# Patient Record
Sex: Female | Born: 1965 | Race: White | Hispanic: No | Marital: Married | Smoking: Never smoker
Health system: Southern US, Community
[De-identification: ages and names within clinical notes are randomized; demographics above are authoritative.]

## PROBLEM LIST (undated history)

## (undated) DIAGNOSIS — G629 Polyneuropathy, unspecified: Secondary | ICD-10-CM

## (undated) DIAGNOSIS — C801 Malignant (primary) neoplasm, unspecified: Secondary | ICD-10-CM

## (undated) DIAGNOSIS — K219 Gastro-esophageal reflux disease without esophagitis: Secondary | ICD-10-CM

## (undated) DIAGNOSIS — F32A Depression, unspecified: Secondary | ICD-10-CM

## (undated) DIAGNOSIS — E063 Autoimmune thyroiditis: Secondary | ICD-10-CM

## (undated) DIAGNOSIS — Z5189 Encounter for other specified aftercare: Secondary | ICD-10-CM

## (undated) DIAGNOSIS — H269 Unspecified cataract: Secondary | ICD-10-CM

## (undated) DIAGNOSIS — M1990 Unspecified osteoarthritis, unspecified site: Secondary | ICD-10-CM

## (undated) DIAGNOSIS — S22080A Wedge compression fracture of T11-T12 vertebra, initial encounter for closed fracture: Secondary | ICD-10-CM

## (undated) DIAGNOSIS — F329 Major depressive disorder, single episode, unspecified: Secondary | ICD-10-CM

## (undated) DIAGNOSIS — M797 Fibromyalgia: Secondary | ICD-10-CM

## (undated) DIAGNOSIS — F909 Attention-deficit hyperactivity disorder, unspecified type: Secondary | ICD-10-CM

## (undated) DIAGNOSIS — Z889 Allergy status to unspecified drugs, medicaments and biological substances status: Secondary | ICD-10-CM

## (undated) DIAGNOSIS — C9000 Multiple myeloma not having achieved remission: Secondary | ICD-10-CM

## (undated) DIAGNOSIS — M810 Age-related osteoporosis without current pathological fracture: Secondary | ICD-10-CM

## (undated) DIAGNOSIS — E039 Hypothyroidism, unspecified: Secondary | ICD-10-CM

## (undated) DIAGNOSIS — D801 Nonfamilial hypogammaglobulinemia: Secondary | ICD-10-CM

## (undated) DIAGNOSIS — F419 Anxiety disorder, unspecified: Secondary | ICD-10-CM

## (undated) DIAGNOSIS — T7840XA Allergy, unspecified, initial encounter: Secondary | ICD-10-CM

## (undated) DIAGNOSIS — N183 Chronic kidney disease, stage 3 unspecified: Secondary | ICD-10-CM

## (undated) HISTORY — PX: ADENOIDECTOMY: SUR15

## (undated) HISTORY — DX: Fibromyalgia: M79.7

## (undated) HISTORY — DX: Hypothyroidism, unspecified: E03.9

## (undated) HISTORY — DX: Chronic kidney disease, stage 3 unspecified: N18.30

## (undated) HISTORY — PX: WRIST SURGERY: SHX841

## (undated) HISTORY — DX: Wedge compression fracture of T11-T12 vertebra, initial encounter for closed fracture: S22.080A

## (undated) HISTORY — DX: Nonfamilial hypogammaglobulinemia: D80.1

## (undated) HISTORY — DX: Encounter for other specified aftercare: Z51.89

## (undated) HISTORY — DX: Gastro-esophageal reflux disease without esophagitis: K21.9

## (undated) HISTORY — DX: Major depressive disorder, single episode, unspecified: F32.9

## (undated) HISTORY — DX: Depression, unspecified: F32.A

## (undated) HISTORY — DX: Unspecified cataract: H26.9

## (undated) HISTORY — PX: BONE MARROW BIOPSY: SHX199

## (undated) HISTORY — DX: Anxiety disorder, unspecified: F41.9

## (undated) HISTORY — PX: PARTIAL HYSTERECTOMY: SHX80

## (undated) HISTORY — DX: Age-related osteoporosis without current pathological fracture: M81.0

## (undated) HISTORY — DX: Unspecified osteoarthritis, unspecified site: M19.90

## (undated) HISTORY — PX: TUBAL LIGATION: SHX77

## (undated) HISTORY — DX: Polyneuropathy, unspecified: G62.9

## (undated) HISTORY — PX: TONSILLECTOMY: SUR1361

## (undated) HISTORY — PX: PORTA CATH INSERTION: CATH118285

## (undated) HISTORY — PX: KNEE SURGERY: SHX244

## (undated) HISTORY — DX: Allergy status to unspecified drugs, medicaments and biological substances: Z88.9

## (undated) HISTORY — DX: Autoimmune thyroiditis: E06.3

## (undated) HISTORY — DX: Attention-deficit hyperactivity disorder, unspecified type: F90.9

## (undated) HISTORY — DX: Allergy, unspecified, initial encounter: T78.40XA

## (undated) HISTORY — PX: SINOSCOPY: SHX187

## (undated) HISTORY — PX: CHEST TUBE INSERTION: SHX231

## (undated) MED FILL — Bortezomib For Inj 3.5 MG: INTRAMUSCULAR | Qty: 1 | Status: AC

## (undated) MED FILL — Dexamethasone Sodium Phosphate Inj 100 MG/10ML: INTRAMUSCULAR | Qty: 4 | Status: AC

## (undated) MED FILL — Daratumumab-Hyaluronidase-fihj Inj 1800-30000 MG-Unit/15ML: SUBCUTANEOUS | Qty: 15 | Status: AC

## (undated) MED FILL — Bortezomib For Inj 3.5 MG: INTRAMUSCULAR | Qty: 1.2 | Status: AC

## (undated) MED FILL — Cyclophosphamide For Inj 1 GM: INTRAMUSCULAR | Qty: 29 | Status: AC

---

## 2001-09-11 ENCOUNTER — Ambulatory Visit (HOSPITAL_BASED_OUTPATIENT_CLINIC_OR_DEPARTMENT_OTHER): Admission: RE | Admit: 2001-09-11 | Discharge: 2001-09-11 | Payer: Self-pay | Admitting: Orthopedic Surgery

## 2006-04-17 ENCOUNTER — Encounter: Admission: RE | Admit: 2006-04-17 | Discharge: 2006-04-17 | Payer: Self-pay | Admitting: Obstetrics and Gynecology

## 2008-04-14 ENCOUNTER — Encounter: Admission: RE | Admit: 2008-04-14 | Discharge: 2008-04-14 | Payer: Self-pay | Admitting: Obstetrics and Gynecology

## 2008-04-27 ENCOUNTER — Encounter: Admission: RE | Admit: 2008-04-27 | Discharge: 2008-04-27 | Payer: Self-pay | Admitting: Emergency Medicine

## 2008-09-12 ENCOUNTER — Encounter: Admission: RE | Admit: 2008-09-12 | Discharge: 2008-09-12 | Payer: Self-pay | Admitting: Family Medicine

## 2008-09-28 ENCOUNTER — Ambulatory Visit (HOSPITAL_BASED_OUTPATIENT_CLINIC_OR_DEPARTMENT_OTHER): Admission: RE | Admit: 2008-09-28 | Discharge: 2008-09-28 | Payer: Self-pay | Admitting: Specialist

## 2008-10-12 ENCOUNTER — Ambulatory Visit (HOSPITAL_BASED_OUTPATIENT_CLINIC_OR_DEPARTMENT_OTHER): Admission: RE | Admit: 2008-10-12 | Discharge: 2008-10-12 | Payer: Self-pay | Admitting: Specialist

## 2009-12-29 ENCOUNTER — Encounter: Admission: RE | Admit: 2009-12-29 | Discharge: 2009-12-29 | Payer: Self-pay | Admitting: Obstetrics and Gynecology

## 2010-01-05 ENCOUNTER — Encounter: Admission: RE | Admit: 2010-01-05 | Discharge: 2010-01-05 | Payer: Self-pay | Admitting: Obstetrics and Gynecology

## 2010-09-25 ENCOUNTER — Encounter: Payer: Self-pay | Admitting: Obstetrics and Gynecology

## 2010-09-25 ENCOUNTER — Encounter: Payer: Self-pay | Admitting: Endocrinology

## 2010-12-19 LAB — POCT I-STAT 4, (NA,K, GLUC, HGB,HCT)
Hemoglobin: 15.3 g/dL — ABNORMAL HIGH (ref 12.0–15.0)
Potassium: 3.9 mEq/L (ref 3.5–5.1)
Sodium: 141 mEq/L (ref 135–145)

## 2011-01-17 NOTE — Op Note (Signed)
NAMEFUSAE, Kimberly Esparza               ACCOUNT NO.:  [id number]   MEDICAL RECORD NO.:  [id number]          PATIENT TYPE:  AMB   LOCATION:  [facility]                         FACILITY:  [facility]   PHYSICIAN:  Jene Every, M.D.    DATE OF BIRTH:  February 25, 1966   DATE OF PROCEDURE:  10/12/2008  DATE OF DISCHARGE:                               OPERATIVE REPORT   PREOPERATIVE DIAGNOSES:  Medial meniscus tear, chondromalacia of the  medial compartment.   POSTOPERATIVE DIAGNOSES:  Medial meniscus tear, chondromalacia of the  medial compartment, chondromalacia of patellofemoral joint.   PROCEDURE PERFORMED:  Left knee arthroscopy, shaving of medial meniscus,  chondroplasty of the patella, medial femoral condyle, tibial plateau.   BRIEF HISTORY AND INDICATION:  This is a 45 year old female status post  knee arthroscopy, debridement on the right with a medial meniscus tear.  She has similar symptoms with mechanical symptoms of left knee  refractory to conservative treatment.  She was indicated for diagnostic  arthroscopy and arthroscopy risks and benefits were discussed including  bleeding, infection, no change in symptoms, worsening of symptoms, need  for repeat debridement, anesthetic complications, etc.   TECHNIQUE:  With the patient in supine position after adequate  anesthesia and 2 g Kefzol, the left lower extremity was prepped and  draped in the sterile fashion.  A lateral parapatellar portal and  superomedial parapatellar portal was fashioned with a #11 blade and the  __________ atraumatically placed.  Irrigant was utilized to insufflate  the joint.  Under  direct visualization medial parapatellar portal was  fashioned with a #11 blade after localization with 18 gauge needle  sparing the medial meniscus.  Noted were some chondral grade 3 changes  of the medial compartment, tibial plateau, loose cartilaginous debris  and mild radial tearing of the medial meniscus.  Collene Mares was introduced  and  utilized to perform a light chondroplasty of tibial plateau and  shaving the meniscus and of the femoral condyle.  The remnant of  meniscus was stable to probe palpation without evidence of further  pathology.  ACL and PCL were unremarkable.  Lateral compartment revealed  normal femoral condyle, tibial plateau and meniscus stable to probe  palpation without evidence of tear.  Patella revealed some grade 3  changes and there was normal patellofemoral tracking.  Light  chondroplasty was performed here.  The gutters were unremarkable.  The  knee was copiously lavaged.  Re-examination of compartment, no further  pathology amenable to arthroscopic intervention.  We checked the entire  surface of the femoral condyles bilaterally and at the tibia a large  focal defect noted.   Next all instrumentation was removed.  Portals were closed with 4-0  nylon simple sutures.  0.25% Marcaine with epinephrine was infiltrated  in the joint and the wound was dressed sterilely.  Awoke without  difficulty and transported to recovery in satisfactory condition.   The patient tolerated the procedure well with no complications.      Jene Every, M.D.  Electronically Signed     JB/MEDQ  D:  10/12/2008  T:  10/13/2008  Job:  [id number]

## 2011-01-17 NOTE — Op Note (Signed)
NAMEALMAROSA, BOHAC               ACCOUNT NO.:  [id number]   MEDICAL RECORD NO.:  [id number]          PATIENT TYPE:  AMB   LOCATION:  [facility]                         [facility]ACILITY:  [facility]   PHYSICIAN:  Jene Every, M.D.    DATE OF BIRTH:  1966-06-18   DATE OF PROCEDURE:  09/28/2008  DATE OF DISCHARGE:                               OPERATIVE REPORT   PREOPERATIVE DIAGNOSIS:  Medial meniscus tear right knee.   POSTOPERATIVE DIAGNOSIS:  Medial meniscus tear right knee, grade 3  chondromalacia of the medial femoral condyle.   PROCEDURE PERFORMED:  1. Right knee arthroscopy.  2. Partial medial meniscectomy.  3. Chondroplasty medial femoral condyle.   BRIEF HISTORY:  This is a 45 year old, MRI indicating medial meniscus  tear.  She had mechanical symptoms, indicated for partial medial  meniscectomy.  Risks and benefits discussed including bleeding,  infection, damage to neurovascular structures, suboptimal range of  motion, persistent pain, need for repeat debridement, anesthetic  complications, DVT, PE, etc.   TECHNIQUE:  With the patient in the supine position, after induction of  adequate general anesthesia and 1 gram of vancomycin the right lower  extremity was prepped and draped in the usual sterile fashion.  A  lateral parapatellar portal and superomedial parapatellar portal was  fashioned with a #11 blade.  Ingress cannula atraumatically placed,  irrigant was utilized to insufflate the joint.  Under direct  visualization a medial parapatellar portal was fashioned with a #11  blade after localization with an 18-gauge needle, sparing the medial  meniscus.  Noted was a radial and complex tear of the posterior third of  the medial meniscus, unstable to probe palpation, a straight basket  rongeur was introduced, utilized to perform partial medial meniscectomy  to a stable base, further contoured with a 3.5 Cuda shaver.  Remnant was  now stable to probe palpation, about 50% of the  posterior third was  resected.  Grade 3 changes of the femoral condyle was noted anteriorly  and a light chondroplasty was performed here.  Minor changes of the  tibial plateau of grade 3 were noted as well.  A light shaving was  performed here.   The intercondylar notch was normal on ACL and PCL.   Lateral compartment revealed normal femoral condyle, tibial plateau and  meniscus stable to probe palpation without evidence of tearing.   Suprapatellar pouch revealed normal patellofemoral tracking, no  significant chondromalacia.  No loose bodies or synovitis.   The gutters, medial and lateral, were unremarkable without loose bodies.   Re-valuation of the medial compartment revealed no further pathology  amenable to arthroscopic intervention.   All instrumentation was removed.  Portals were closed with 4-0 nylon  simple sutures, 0.25% Marcaine with epinephrine was infiltrated in the  joint, wound was dressed sterilely, she was awoken without difficulty  and transported to the recovery room in satisfactory condition.   The patient tolerated the procedure well, there were no complications.  No assistant.      Jene Every, M.D.  Electronically Signed     JB/MEDQ  D:  09/28/2008  T:  09/28/2008  Job:  [id number]

## 2011-01-20 NOTE — Op Note (Signed)
[place]  Patient:    JAYMES, REVELS Visit Number: [id number] MRN: [id number]          Service Type: Attending:  Artist Pais. Mina Marble, M.D. Dictated by:   Artist Pais Mina Marble, M.D. Proc. Date: 09/11/01                             Operative Report  PREOPERATIVE DIAGNOSIS:  Right index and right first dorsal compartment stenosing tenosynovitis and also right forearm lesion.  POSTOPERATIVE DIAGNOSIS:  Right index and right first dorsal compartment stenosing tenosynovitis and also right forearm lesion.  PROCEDURE: 1. Release A1 pulley, right index finger. 2. Release first dorsal compartment. 3. Excisional biopsy lesion volar forearm, right arm.  SURGEON:  Artist Pais. Mina Marble, M.D.  ASSISTANT:  Junius Roads. Ireton, P.A.C.  ANESTHESIA:  Bier block.  TOURNIQUET TIME:  30 minutes.  COMPLICATIONS:  None.  DRAINS:  None.  SPECIMENS:  One specimen sent.  DESCRIPTION:  Patient was taken to the operating room where after the induction of adequate Bier block analgesia the right upper extremity was prepped and draped in usual sterile fashion.  Once this was done a 1.5 cm incision was made in the palmar aspect of the index finger area over the A1 pulley.  The incision was taken down through the skin and subcutaneous tissues until the palmar fascia was identified.  This was split, thus exposing the flexor system.  The radial and ulnar vascular bundles were identified and retracted, and the A1 pulley was released with a 15 blade, thus freeing up the superficialis profundus tendon.  These were lysed of all adhesions.  The wound was irrigated and closed with 5-0 nylon sutures in a combination of horizontal mattress and simple sutures.  Next, the dorsal compartment release was undertaken 1 cm proximal to the radial styloid, 2.5 cm in length.  Incision was taken down through the skin and subcutaneous tissues until the cephalic vein and the superficial  radial nerve were identified and retracted dorsally.  The first dorsal compartment was identified and incised at the most dorsal component.  There were three separate slips - two for the APL and one for the EPB - and these were all lysed of all adhesions.  This wound was also irrigated and closed with a running 3-0 Prolene subcuticular stitch.  Finally, a 7 mm x 7 mm circular lesion over the volar forearm in the mid forearm area was elliptically excised for pathologic confirmation/diagnosis. This was done with a 15 blade and then closed with a running 3-0 Prolene subcuticular stitch.  Steri-Strips, 4x4s, fluffs, and a compressive dressing was applied to all the wounds after Marcaine was injected.  Patient tolerated the procedure well and went to recovery room in stable fashion. Dictated by:   Artist Pais Mina Marble, M.D. Attending:  Artist Pais Mina Marble, M.D. DD:  09/11/01 TD:  09/11/01 Job: [id number]

## 2013-07-25 DIAGNOSIS — R4701 Aphasia: Secondary | ICD-10-CM | POA: Insufficient documentation

## 2013-07-25 DIAGNOSIS — G253 Myoclonus: Secondary | ICD-10-CM | POA: Insufficient documentation

## 2013-07-25 DIAGNOSIS — R5381 Other malaise: Secondary | ICD-10-CM | POA: Insufficient documentation

## 2013-07-25 DIAGNOSIS — R6889 Other general symptoms and signs: Secondary | ICD-10-CM | POA: Insufficient documentation

## 2016-09-12 ENCOUNTER — Ambulatory Visit (INDEPENDENT_AMBULATORY_CARE_PROVIDER_SITE_OTHER): Payer: Commercial Managed Care - PPO | Admitting: Allergy

## 2016-09-12 ENCOUNTER — Encounter: Payer: Self-pay | Admitting: Allergy

## 2016-09-12 VITALS — BP 120/84 | HR 80 | Temp 98.0°F | Resp 18 | Ht 65.4 in | Wt 199.4 lb

## 2016-09-12 DIAGNOSIS — H1010 Acute atopic conjunctivitis, unspecified eye: Secondary | ICD-10-CM | POA: Diagnosis not present

## 2016-09-12 DIAGNOSIS — L299 Pruritus, unspecified: Secondary | ICD-10-CM

## 2016-09-12 DIAGNOSIS — R21 Rash and other nonspecific skin eruption: Secondary | ICD-10-CM

## 2016-09-12 DIAGNOSIS — J309 Allergic rhinitis, unspecified: Secondary | ICD-10-CM

## 2016-09-12 MED ORDER — MOMETASONE FUROATE 0.1 % EX OINT: TOPICAL_OINTMENT | Freq: Two times a day (BID) | CUTANEOUS | 3 refills | Status: DC

## 2016-09-12 MED ORDER — AZELASTINE HCL 0.1 % NA SOLN: 2.0000 | Freq: Two times a day (BID) | NASAL | 3 refills | Status: DC | PRN

## 2016-09-12 MED ORDER — LEVOCETIRIZINE DIHYDROCHLORIDE 5 MG PO TABS: 5.0000 mg | ORAL_TABLET | Freq: Every day | ORAL | 3 refills | Status: DC

## 2016-09-12 NOTE — Patient Instructions (Addendum)
Itching/Rash    - likely allergic trigger with known constant exposures to dog and mold.  Will obtain environmental allergen profile to see if you have developed any new allergies.     - may also have a contact dermatitis component given appearance of rash and distribution.   Will assess via TRUE test patch testing.  Placed today.  Do not get patches wet.  Return on Thursday for reading.     - recommend avoiding dog contact exposure as much as possible.  Rinse skin that has come in contact as soon as possible    - Take Xyzal 5mg  twice a day    - Take Zantac 300mg  daily.  Once you have completed this bottle recommend Zantac 150mg  twice a day with Xyzal    - Continue Singulair daily    - use Elocon ointment on affects areas of rash twice a day until resolved.   Continue daily moisturization  Allergies   - antihistamine as above   - trial Astelin nasal spray for control of nasal drainage and post nasal drip   Follow-up 2 days for patch reading then routine visit in 3 months

## 2016-09-12 NOTE — Progress Notes (Signed)
New Patient Note  RE: Kimberly Esparza MRN: [id number] DOB: 12-26-1965 Date of Office Visit: 09/12/2016  Referring provider: No ref. provider found Primary care provider: Myrlene Broker, MD  Chief Complaint: itching, rash  History of present illness: Kimberly Esparza is a 51 y.o. female presenting today for evaluation of pruritus and rash.   She is a previous pt of this practice with last visit in March 2014.  She has history of allergic rhinoconjunctivitis as well as pruritus.  She knows her dog exposure may worsen her itchiness however she feels her pruritus may be due to other triggers that she would like to identify.  She returns today as she has been having worsening of her pruritus as well as rash.   She scratches to the point that she breaks the skin.  She is itchier since stopping her antihistamine for this appointment today.  She has been using triamcinolone for the itching which does not seem to help.  She is also constantly using scent/color free soaps, detergents.  The itching and rash is predominantly on her arms and thighs.  She is not sure if there is something she is putting on or where he was exposed to other then her dog that could be causing her continued symptoms. She takes Xyzal and Singulair daily.   No fevers, no household members with similar rash. No recent travel.  She does not have any associated swelling, joint aches or pains.  She has history of allergic rhinoconjunctivitis.  She is allergic to mold and her job she knows has mold.   She complains of nasal drainage and PND.   She was on allergen immunotherapy and reached about the 3/5 vial before stopping as it became too inconvenient for her.   Review of systems: Review of Systems  Constitutional: Negative for chills, fever and malaise/fatigue.  HENT: Positive for congestion. Negative for ear pain, nosebleeds, sinus pain and sore throat.   Eyes: Negative for discharge and redness.  Respiratory: Negative for  cough, shortness of breath and wheezing.   Cardiovascular: Negative for chest pain.  Gastrointestinal: Negative for abdominal pain, heartburn, nausea and vomiting.  Musculoskeletal: Negative for joint pain and myalgias.  Skin: Positive for itching and rash.    All other systems negative unless noted above in HPI  Past medical history: Past Medical History:  Diagnosis Date  . Depression   . GERD (gastroesophageal reflux disease)   . Hypothyroidism   . Multiple allergies   . Osteoarthritis     Past surgical history: Past Surgical History:  Procedure Laterality Date  . ADENOIDECTOMY    . KNEE SURGERY Bilateral   . PARTIAL HYSTERECTOMY    . SINOSCOPY    . TONSILLECTOMY    . TUBAL LIGATION    . WRIST SURGERY Bilateral     Family history:  Family History  Problem Relation Age of Onset  . Hypertension Mother   . Diabetes Mother   . Glaucoma Mother   . Gout Mother   . Allergic rhinitis Father   . Allergic rhinitis Brother   . Gout Brother     Social history: She lives in a home without carpeting. There is no concern for water damage or mildew. There is electric heating and central cooling. She does use a humidifier. There are dogs outside the home. There is no concern for roaches. She works as an Electronics engineer.   Medication List: Allergies as of 09/12/2016   No Known Allergies  Medication List       Accurate as of 09/12/16  5:47 PM. Always use your most recent med list.          azelastine 0.1 % nasal spray Commonly known as:  ASTELIN Place 2 sprays into both nostrils 2 (two) times daily as needed for rhinitis. Use in each nostril as directed   buPROPion 300 MG 24 hr tablet Commonly known as:  WELLBUTRIN XL Take 300 mg by mouth daily.   DEXILANT 60 MG capsule Generic drug:  dexlansoprazole Take 1 capsule by mouth daily.   DULoxetine 60 MG capsule Commonly known as:  CYMBALTA Take 60 mg by mouth daily.   ESTROVEN PO Take by mouth.   etodolac  500 MG tablet Commonly known as:  LODINE Take 500 mg by mouth daily.   HAIR/SKIN/NAILS PO Take by mouth.   HYDROcodone-acetaminophen 5-325 MG tablet Commonly known as:  NORCO/VICODIN Take 1 tablet by mouth. for pain   lansoprazole 30 MG capsule Commonly known as:  PREVACID Take 30 mg by mouth daily as needed.   levocetirizine 5 MG tablet Commonly known as:  XYZAL Take 1 tablet (5 mg total) by mouth daily.   levothyroxine 75 MCG tablet Commonly known as:  SYNTHROID, LEVOTHROID Take 75 mcg by mouth daily.   mometasone 0.1 % ointment Commonly known as:  ELOCON Apply topically 2 (two) times daily.   montelukast 10 MG tablet Commonly known as:  SINGULAIR Take 10 mg by mouth daily.   ranitidine 300 MG tablet Commonly known as:  ZANTAC Take 300 mg by mouth daily.   traMADol 50 MG tablet Commonly known as:  ULTRAM Take 50 mg by mouth every 6 (six) hours as needed.   triamcinolone cream 0.1 % Commonly known as:  KENALOG apply TO affected AREA TWICE DAILY FOR ONE TO TWO WEEKS   vitamin C 1000 MG tablet Take 1,000 mg by mouth daily.   Vitamin D 2000 units Caps Take by mouth.   Zinc 50 MG Caps Take by mouth.       Known medication allergies: No Known Allergies   Physical examination: Blood pressure 120/84, pulse 80, temperature 98 F (36.7 C), temperature source Oral, resp. rate 18, height 5' 5.4" (1.661 m), weight 199 lb 6.4 oz (90.4 kg).  General: Alert, interactive, in no acute distress. HEENT: TMs pearly gray, turbinates mildly edematous without discharge, post-pharynx non erythematous. Neck: Supple without lymphadenopathy. Lungs: Clear to auscultation without wheezing, rhonchi or rales. {no increased work of breathing. CV: Normal S1, S2 without murmurs. Abdomen: Nondistended, nontender. Skin: Bilateral arms with erythematous his linear marks from sporadic she has lost excoriated patches. Extremities:  No clubbing, cyanosis or edema. Neuro:   Grossly  intact.  Diagnositics/Labs: TRUE test patch placement applied to the back, marked and taped.  Assessment and plan:   Pruritus/excoriated rash    - likely allergic trigger with known constant exposures to dog and mold.  Will obtain environmental allergen profile to see if she has developed any new allergies.     - may also have a contact dermatitis component given appearance of rash and distribution. She is also concerned that there is something she is coming into contact with on a regular basis worsening her symptoms.   Will assess via TRUE test patch testing.  Placed today.  Do not get patches wet.  Return on Thursday for reading.     - recommend avoiding dog contact exposure as much as possible.  Rinse skin that has  come in contact with known allergen as soon as possible    - Take Xyzal 5mg  twice a day    - Take Zantac 300mg  daily.  Once you have completed this bottle recommend Zantac 150mg  twice a day with Xyzal    - Continue Singulair daily    - use Elocon ointment on affects areas of rash twice a day until resolved.   Continue daily moisturization  Allergies   - antihistamine as above   - trial Astelin nasal spray for control of nasal drainage and post nasal drip   Follow-up 2 days for patch reading then routine visit in 3 months  I appreciate the opportunity to take part in Nathali's care. Please do not hesitate to contact me with questions.  Sincerely,   Prudy Feeler, MD Allergy/Immunology [facility] [place] [facility] [place]

## 2016-09-16 LAB — ALLERGENS W/TOTAL IGE AREA 2
Cat Dander IgE: <0.1 kU/L
Common Silver Birch IgE: <0.1 kU/L
D Farinae IgE: <0.1 kU/L
D Pteronyssinus IgE: <0.1 kU/L
Elm, American IgE: <0.1 kU/L
IGE (IMMUNOGLOBULIN E), SERUM: 8 [IU]/mL (ref 0–100)
Maple/Box Elder IgE: <0.1 kU/L
Mouse Urine IgE: <0.1 kU/L
Oak, White IgE: <0.1 kU/L
Pecan, Hickory IgE: <0.1 kU/L
Penicillium Chrysogen IgE: <0.1 kU/L
Sheep Sorrel IgE Qn: <0.1 kU/L

## 2016-09-19 ENCOUNTER — Encounter: Payer: Self-pay | Admitting: Allergy

## 2016-09-19 ENCOUNTER — Ambulatory Visit (INDEPENDENT_AMBULATORY_CARE_PROVIDER_SITE_OTHER): Payer: Commercial Managed Care - PPO | Admitting: Allergy

## 2016-09-19 VITALS — BP 118/76 | HR 90 | Resp 14

## 2016-09-19 DIAGNOSIS — L253 Unspecified contact dermatitis due to other chemical products: Secondary | ICD-10-CM | POA: Diagnosis not present

## 2016-09-19 DIAGNOSIS — H1010 Acute atopic conjunctivitis, unspecified eye: Secondary | ICD-10-CM | POA: Diagnosis not present

## 2016-09-19 DIAGNOSIS — J309 Allergic rhinitis, unspecified: Secondary | ICD-10-CM | POA: Diagnosis not present

## 2016-09-19 MED ORDER — LEVOCETIRIZINE DIHYDROCHLORIDE 5 MG PO TABS: ORAL_TABLET | ORAL | 5 refills | Status: DC

## 2016-09-19 NOTE — Progress Notes (Signed)
Follow-up Note  RE: Kimberly Esparza MRN: [id number] DOB: 12-29-1965 Date of Office Visit: 09/19/2016   History of present illness: Kimberly Esparza is a 51 y.o. female presenting today for follow-up of patch testing as well as to review labs.  She was seen in the office on 09/12/16 at which time TRUE test patch were placed.   She returned for first patch reading on 09/14/16 which was read by Dr. Neldon Mc with positive testing for Gold.   Since her last visit she reports her itching/rash is much improved with Xyzal and singulair daily.   She did use Zantac the first several days after her last visit but is no longer using this currently.    Review of systems: Review of Systems  Constitutional: Negative for chills, fever and malaise/fatigue.  HENT: Negative for congestion, ear pain, nosebleeds, sinus pain and sore throat.   Eyes: Negative for discharge and redness.  Respiratory: Negative for cough, shortness of breath and wheezing.   Cardiovascular: Negative for chest pain.  Gastrointestinal: Negative for heartburn, nausea and vomiting.  Skin: Positive for itching and rash.    All other systems negative unless noted above in HPI  Past medical/social/surgical/family history have been reviewed and are unchanged unless specifically indicated below.  No changes  Medication List: Allergies as of 09/19/2016   No Known Allergies     Medication List       Accurate as of 09/19/16 11:39 AM. Always use your most recent med list.          azelastine 0.1 % nasal spray Commonly known as:  ASTELIN Place 2 sprays into both nostrils 2 (two) times daily as needed for rhinitis. Use in each nostril as directed   buPROPion 300 MG 24 hr tablet Commonly known as:  WELLBUTRIN XL Take 300 mg by mouth daily.   DEXILANT 60 MG capsule Generic drug:  dexlansoprazole Take 1 capsule by mouth daily.   DULoxetine 60 MG capsule Commonly known as:  CYMBALTA Take 60 mg by mouth daily.   ESTROVEN PO Take  by mouth.   etodolac 500 MG tablet Commonly known as:  LODINE Take 500 mg by mouth daily.   HAIR/SKIN/NAILS PO Take by mouth.   HYDROcodone-acetaminophen 5-325 MG tablet Commonly known as:  NORCO/VICODIN Take 1 tablet by mouth. for pain   lansoprazole 30 MG capsule Commonly known as:  PREVACID Take 30 mg by mouth daily as needed.   levocetirizine 5 MG tablet Commonly known as:  XYZAL Take 1 tablet (5 mg total) by mouth daily.   levothyroxine 75 MCG tablet Commonly known as:  SYNTHROID, LEVOTHROID Take 75 mcg by mouth daily.   mometasone 0.1 % ointment Commonly known as:  ELOCON Apply topically 2 (two) times daily.   montelukast 10 MG tablet Commonly known as:  SINGULAIR Take 10 mg by mouth daily.   ranitidine 300 MG tablet Commonly known as:  ZANTAC Take 300 mg by mouth daily.   traMADol 50 MG tablet Commonly known as:  ULTRAM Take 50 mg by mouth every 6 (six) hours as needed.   triamcinolone cream 0.1 % Commonly known as:  KENALOG apply TO affected AREA TWICE DAILY FOR ONE TO TWO WEEKS   vitamin C 1000 MG tablet Take 1,000 mg by mouth daily.   Vitamin D 2000 units Caps Take by mouth.   Zinc 50 MG Caps Take by mouth.       Known medication allergies: No Known Allergies   Physical examination:  Blood pressure 118/76, pulse 90, resp. rate 14.  General: Alert, interactive, in no acute distress. HEENT: TMs pearly gray, turbinates minimally edematous without discharge, post-pharynx non erythematous. Neck: Supple without lymphadenopathy. Lungs: Clear to auscultation without wheezing, rhonchi or rales. {no increased work of breathing. CV: Normal S1, S2 without murmurs. Abdomen: Nondistended, nontender. Skin: excoriated lesions with healing scabx over b/l arms.  much improved from prvevious exam.   Back without any new areas from patch testing. Extremities:  No clubbing, cyanosis or edema. Neuro:   Grossly intact.  Diagnositics/Labs: Labs:   Component     Latest Ref Rng & Units 09/12/2016  IgE (Immunoglobulin E), Serum     0 - 100 IU/mL 8  D Pteronyssinus IgE     Class 0 kU/L <0.10  D Farinae IgE     Class 0 kU/L <0.10  Cat Dander IgE     Class 0 kU/L <0.10  Dog Dander IgE     Class 0 kU/L <0.10  [place] Grass IgE     Class 0 kU/L <0.10  Timothy Grass IgE     Class 0 kU/L <0.10  Johnson Grass IgE     Class 0 kU/L <0.10  Cockroach, German IgE     Class 0 kU/L <0.10  Penicillium Chrysogen IgE     Class 0 kU/L <0.10  Cladosporium Herbarum IgE     Class 0 kU/L <0.10  Aspergillus Fumigatus IgE     Class 0 kU/L <0.10  Alternaria Alternata IgE     Class 0 kU/L <0.10  Maple/Box Elder IgE     Class 0 kU/L <0.10  Common Silver Wendee Copp IgE     Class 0 kU/L <0.10  Cedar, [place] IgE     Class 0 kU/L <0.10  Oak, White IgE     Class 0 kU/L <0.10  Elm, American IgE     Class 0 kU/L <0.10  Cottonwood IgE     Class 0 kU/L <0.10  Pecan, Hickory IgE     Class 0 kU/L <0.10  White Mulberry IgE     Class 0 kU/L <0.10  Ragweed, Short IgE     Class 0 kU/L <0.10  Pigweed, Rough IgE     Class 0 kU/L <0.10  Sheep Sorrel IgE Qn     Class 0 kU/L <0.10  Mouse Urine IgE     Class 0 kU/L <0.10    Assessment and plan:   Contact dermatitis    -  TRUE test patch testing at 48hr reading was positive for Gold #28.  She was provided with informational handout on Gold.  Recommend avoidance of Gold products.     - Take Xyzal 5mg  daily (up to twice a day if needed for itch control)    - she may also take Zantac 150mg  twice a day with Xyzal if she needs to further itch control     - Continue Singulair daily    - use Elocon ointment on affects areas of rash twice a day until resolved.   Continue daily moisturization  Allergic rhinoconjunctivitis   - she has history of mold and pet sensitivity and was on immunotherapy but did not reach maintenance before discontinuing.    - antihistamine as above   - continue Astelin nasal spray for  control of nasal drainage and post nasal drip   - serum IgE environmental panel was negative.  Advised to confirm would recommend skin testing off antihistamine.     Follow-up 3-4 month  I appreciate the opportunity to take part in Arlicia's care. Please do not hesitate to contact me with questions.  Sincerely,   Prudy Feeler, MD Allergy/Immunology [facility] [place] [facility] [place]

## 2016-09-19 NOTE — Patient Instructions (Addendum)
Contact dermatitis    -  TRUE test patch testing at 48hr reading was positive for Gold #28.  She was provided with informational handout on Gold.  Recommend avoidance of Gold products.     - Take Xyzal 5mg  daily (up to twice a day if needed for itch control)    - she may also take Zantac 150mg  twice a day with Xyzal if she needs to further itch control     - Continue Singulair daily    - use Elocon ointment on affects areas of rash twice a day until resolved.   Continue daily moisturization  Allergies   - antihistamine as above   - continue Astelin nasal spray for control of nasal drainage and post nasal drip   - serum IgE environmental panel was negative.  Advised to confirm would recommend skin testing off antihistamine.     Follow-up 3-4 month

## 2017-09-08 DIAGNOSIS — E039 Hypothyroidism, unspecified: Secondary | ICD-10-CM | POA: Insufficient documentation

## 2017-09-08 DIAGNOSIS — R42 Dizziness and giddiness: Secondary | ICD-10-CM | POA: Insufficient documentation

## 2017-09-08 DIAGNOSIS — K529 Noninfective gastroenteritis and colitis, unspecified: Secondary | ICD-10-CM | POA: Insufficient documentation

## 2017-11-05 ENCOUNTER — Other Ambulatory Visit: Payer: Self-pay | Admitting: Allergy

## 2017-11-05 DIAGNOSIS — J309 Allergic rhinitis, unspecified: Principal | ICD-10-CM

## 2017-11-05 DIAGNOSIS — L253 Unspecified contact dermatitis due to other chemical products: Secondary | ICD-10-CM

## 2017-11-05 DIAGNOSIS — H1010 Acute atopic conjunctivitis, unspecified eye: Secondary | ICD-10-CM

## 2018-01-07 ENCOUNTER — Other Ambulatory Visit: Payer: Self-pay | Admitting: Allergy

## 2018-01-07 DIAGNOSIS — H1010 Acute atopic conjunctivitis, unspecified eye: Secondary | ICD-10-CM

## 2018-01-07 DIAGNOSIS — J309 Allergic rhinitis, unspecified: Principal | ICD-10-CM

## 2018-01-07 DIAGNOSIS — L253 Unspecified contact dermatitis due to other chemical products: Secondary | ICD-10-CM

## 2018-01-11 ENCOUNTER — Other Ambulatory Visit: Payer: Self-pay | Admitting: Allergy

## 2018-01-11 DIAGNOSIS — L253 Unspecified contact dermatitis due to other chemical products: Secondary | ICD-10-CM

## 2018-01-11 DIAGNOSIS — H1010 Acute atopic conjunctivitis, unspecified eye: Secondary | ICD-10-CM

## 2018-01-11 DIAGNOSIS — J309 Allergic rhinitis, unspecified: Principal | ICD-10-CM

## 2018-01-11 NOTE — Telephone Encounter (Signed)
Courtesy refill  

## 2018-03-04 ENCOUNTER — Other Ambulatory Visit: Payer: Self-pay | Admitting: Allergy

## 2018-03-04 DIAGNOSIS — H1010 Acute atopic conjunctivitis, unspecified eye: Secondary | ICD-10-CM

## 2018-03-04 DIAGNOSIS — J309 Allergic rhinitis, unspecified: Principal | ICD-10-CM

## 2018-03-04 DIAGNOSIS — L253 Unspecified contact dermatitis due to other chemical products: Secondary | ICD-10-CM

## 2018-10-24 DIAGNOSIS — G8929 Other chronic pain: Secondary | ICD-10-CM | POA: Insufficient documentation

## 2018-10-24 DIAGNOSIS — M25562 Pain in left knee: Secondary | ICD-10-CM | POA: Insufficient documentation

## 2018-12-09 DIAGNOSIS — E063 Autoimmune thyroiditis: Secondary | ICD-10-CM | POA: Insufficient documentation

## 2019-09-09 DIAGNOSIS — S52502A Unspecified fracture of the lower end of left radius, initial encounter for closed fracture: Secondary | ICD-10-CM | POA: Insufficient documentation

## 2019-12-10 DIAGNOSIS — M659 Synovitis and tenosynovitis, unspecified: Secondary | ICD-10-CM | POA: Insufficient documentation

## 2020-09-21 DIAGNOSIS — E039 Hypothyroidism, unspecified: Secondary | ICD-10-CM | POA: Insufficient documentation

## 2020-09-21 DIAGNOSIS — Z889 Allergy status to unspecified drugs, medicaments and biological substances status: Secondary | ICD-10-CM | POA: Insufficient documentation

## 2020-09-21 DIAGNOSIS — M1990 Unspecified osteoarthritis, unspecified site: Secondary | ICD-10-CM | POA: Insufficient documentation

## 2020-09-21 DIAGNOSIS — F32A Depression, unspecified: Secondary | ICD-10-CM | POA: Insufficient documentation

## 2020-09-21 DIAGNOSIS — K219 Gastro-esophageal reflux disease without esophagitis: Secondary | ICD-10-CM | POA: Insufficient documentation

## 2020-09-28 NOTE — Progress Notes (Signed)
Cardiology Office Note:    Date:  09/29/2020   ID:  [name], DOB March 19, 1966, MRN [id number]  PCP:  Earlyne Iba, NP  Cardiologist:  Shirlee More, MD   Referring MD: Myrlene Broker, MD  ASSESSMENT:    1. Palpitations   2. Acquired hypothyroidism    PLAN:    In order of problems listed above:  1. Palpitations- currently in sinus rhythm.  She is able to feel her heart racing periodically but denies any chest pain or shortness of breath.  Likely anxiety contributing but cannot rule out arrhythmia.  Will apply Zio monitor for 1 week. Considered electrolyte imbalance. Patient reports she recently had blood work completed by her PCP.  Request results from [facility] for review.  2. Stable continue her current thyroid supplement  Next appointment 1 month after results of monitor.  Medication Adjustments/Labs and Tests Ordered: Current medicines are reviewed at length with the patient today.  Concerns regarding medicines are outlined above.  Orders Placed This Encounter  Procedures  . LONG TERM MONITOR (3-14 DAYS)  . EKG 12-Lead   No orders of the defined types were placed in this encounter.    Chief Complaint  Patient presents with  . Palpitations    History of Present Illness:    Kimberly Esparza is a 55 y.o. female who is being seen today for the evaluation of palpitation at the request of Myrlene Broker, MD. Urgent care 09/08/2020 testing for COVID-19 PCR normal   Patient reports elevated heart rate to 102.  At rest heart rate in 80's but when active increases to low 110's.  She bought an watch to monitor her heart rate periodically.  Denies any chest pain, shortness of breath or lower extremity edema.  She has no family history of cardiac disease.  Reports history of Hashimoto Thyroiditis and Anxiety.  Denies any OTC medications causing increase in heart rate.  Reports that she is exhausted all the time and cannot look after her family like previously.     She has had palpitations in the past but not severe sustained and has no documented arrhythmia or known heart disease heart murmur congenital or rheumatic.  She takes no over-the-counter proarrhythmic drugs.  The predominance of her concern is based off of her smart watch with heart rate monitoring.  Past Medical History:  Diagnosis Date  . Depression   . GERD (gastroesophageal reflux disease)   . Hypothyroidism   . Multiple allergies   . Osteoarthritis     Past Surgical History:  Procedure Laterality Date  . ADENOIDECTOMY    . KNEE SURGERY Bilateral   . PARTIAL HYSTERECTOMY    . SINOSCOPY    . TONSILLECTOMY    . TUBAL LIGATION    . WRIST SURGERY Bilateral     Current Medications: Current Meds  Medication Sig  . Ascorbic Acid (VITAMIN C) 1000 MG tablet Take 1,000 mg by mouth daily.  Marland Kitchen azelastine (ASTELIN) 0.1 % nasal spray Place 2 sprays into both nostrils 2 (two) times daily as needed for rhinitis. Use in each nostril as directed  . buPROPion (WELLBUTRIN XL) 300 MG 24 hr tablet Take 300 mg by mouth daily.  . cetirizine (ZYRTEC) 10 MG tablet Take 10 mg by mouth daily.  . Cholecalciferol (VITAMIN D) 125 MCG (5000 UT) CAPS Take 1 capsule by mouth daily.  Marland Kitchen DEXILANT 60 MG capsule Take 1 capsule by mouth daily.  . Eszopiclone 3 MG TABS Take 3 mg  by mouth at bedtime as needed.  . etodolac (LODINE) 500 MG tablet Take 500 mg by mouth 2 (two) times daily.  . ferrous gluconate (FERGON) 324 MG tablet Take 1 tablet by mouth at bedtime.  Marland Kitchen FLUoxetine (PROZAC) 20 MG capsule Take 20 mg by mouth daily.  . fluticasone (FLONASE) 50 MCG/ACT nasal spray Place 1 spray into the nose daily.  Marland Kitchen gabapentin (NEURONTIN) 400 MG capsule Take 400 mg by mouth at bedtime.  Marland Kitchen HYDROcodone-acetaminophen (NORCO/VICODIN) 5-325 MG tablet Take 1 tablet by mouth. for pain  . lansoprazole (PREVACID) 30 MG capsule Take 30 mg by mouth daily as needed.  Marland Kitchen levocetirizine (XYZAL) 5 MG tablet TAKE ONE TABLET BY MOUTH  TWICE DAILY AS DIRECTED  . levothyroxine (SYNTHROID) 88 MCG tablet Take 88 mcg by mouth daily.  . montelukast (SINGULAIR) 10 MG tablet Take 10 mg by mouth daily.  . Nutritional Supplements (ESTROVEN PO) Take by mouth.  . Zinc 50 MG CAPS Take by mouth.     Allergies:   Doxycycline   Social History   Socioeconomic History  . Marital status: Married    Spouse name: Not on file  . Number of children: Not on file  . Years of education: Not on file  . Highest education level: Not on file  Occupational History  . Not on file  Tobacco Use  . Smoking status: Never Smoker  . Smokeless tobacco: Never Used  Substance and Sexual Activity  . Alcohol use: Not on file  . Drug use: Not on file  . Sexual activity: Not on file  Other Topics Concern  . Not on file  Social History Narrative  . Not on file   Social Determinants of Health   Financial Resource Strain: Not on file  Food Insecurity: Not on file  Transportation Needs: Not on file  Physical Activity: Not on file  Stress: Not on file  Social Connections: Not on file     Family History: The patient's family history includes Allergic rhinitis in her brother and father; Diabetes in her mother; Glaucoma in her mother; Gout in her brother and mother; Hypertension in her mother.  ROS:   ROS Please see the history of present illness.     All other systems reviewed and are negative.  EKGs/Labs/Other Studies Reviewed:    The following studies were reviewed today:   EKG:  EKG is ordered today.  The ekg ordered today is personally reviewed and demonstrates NSR is normal including QT interval  Recent Labs: Requested from her primary care physician unavailable in epic  Physical Exam:    VS:  BP 132/88   Pulse 88   Ht 5\' 5"  (1.651 m)   Wt 209 lb 12.8 oz (95.2 kg)   SpO2 97%   BMI 34.91 kg/m     Wt Readings from Last 3 Encounters:  09/29/20 209 lb 12.8 oz (95.2 kg)  09/12/16 199 lb 6.4 oz (90.4 kg)     GEN:  Well  nourished, well developed in no acute distress HEENT: Normal NECK: No JVD; No carotid bruits LYMPHATICS: No lymphadenopathy CARDIAC: RRR, no murmurs, rubs, gallops RESPIRATORY:  Clear to auscultation without rales, wheezing or rhonchi  ABDOMEN: Soft, non-tender, non-distended MUSCULOSKELETAL:  No edema; No deformity  SKIN: Warm and dry NEUROLOGIC:  Alert and oriented x 3 PSYCHIATRIC:  Normal affect     Signed, Shirlee More, MD  09/29/2020 10:06 AM    [place]

## 2020-09-29 ENCOUNTER — Encounter: Payer: Self-pay | Admitting: Cardiology

## 2020-09-29 ENCOUNTER — Ambulatory Visit: Payer: PRIVATE HEALTH INSURANCE | Admitting: Cardiology

## 2020-09-29 ENCOUNTER — Ambulatory Visit (INDEPENDENT_AMBULATORY_CARE_PROVIDER_SITE_OTHER): Payer: PRIVATE HEALTH INSURANCE

## 2020-09-29 ENCOUNTER — Other Ambulatory Visit: Payer: Self-pay

## 2020-09-29 VITALS — BP 132/88 | HR 88 | Ht 65.0 in | Wt 209.8 lb

## 2020-09-29 DIAGNOSIS — R002 Palpitations: Secondary | ICD-10-CM

## 2020-09-29 DIAGNOSIS — E039 Hypothyroidism, unspecified: Secondary | ICD-10-CM

## 2020-09-29 NOTE — Patient Instructions (Signed)
Medication Instructions:  °Your physician recommends that you continue on your current medications as directed. Please refer to the Current Medication list given to you today. ° °*If you need a refill on your cardiac medications before your next appointment, please call your pharmacy* ° ° °Lab Work: °None. ° °If you have labs (blood work) drawn today and your tests are completely normal, you will receive your results only by: °• MyChart Message (if you have MyChart) OR °• A paper copy in the mail °If you have any lab test that is abnormal or we need to change your treatment, we will call you to review the results. ° ° °Testing/Procedures: °A zio monitor was ordered today. It will remain on for 7 days. You will then return monitor and event diary in provided box. It takes 1-2 weeks for report to be downloaded and returned to us. We will call you with the results. If monitor falls off or has orange flashing light, please call Zio for further instructions.  ° ° ° ° °Follow-Up: °At [facility], you and your health needs are our priority.  As part of our continuing mission to provide you with exceptional heart care, we have created designated Provider Care Teams.  These Care Teams include your primary Cardiologist (physician) and Advanced Practice Providers (APPs -  Physician Assistants and Nurse Practitioners) who all work together to provide you with the care you need, when you need it. ° °We recommend signing up for the patient portal called "MyChart".  Sign up information is provided on this After Visit Summary.  MyChart is used to connect with patients for Virtual Visits (Telemedicine).  Patients are able to view lab/test results, encounter notes, upcoming appointments, etc.  Non-urgent messages can be sent to your provider as well.   °To learn more about what you can do with MyChart, go to [email or web address].   ° °Your next appointment:   °1 month(s) ° °The format for your next appointment:   °In  Person ° °Provider:   °Brian Munley, MD ° ° °Other Instructions ° ° ° °

## 2020-10-14 ENCOUNTER — Telehealth: Payer: Self-pay

## 2020-10-14 NOTE — Telephone Encounter (Signed)
-----   Message from Richardo Priest, MD sent at 10/14/2020 12:23 PM EST ----- This is a good result She has very few extra beats when she is seeing normal young people The episodes that she notices her own heart rhythm but the rate is rapid its not abnormal. This is very reassuring.

## 2020-10-14 NOTE — Telephone Encounter (Signed)
Left message on patients voicemail to please return our call.   

## 2020-10-14 NOTE — Telephone Encounter (Signed)
Spoke with patient regarding results and recommendation.  Patient verbalizes understanding and is agreeable to plan of care. Advised patient to call back with any issues or concerns.  

## 2020-11-09 ENCOUNTER — Other Ambulatory Visit: Payer: Self-pay

## 2020-11-09 ENCOUNTER — Ambulatory Visit: Payer: PRIVATE HEALTH INSURANCE | Admitting: Cardiology

## 2020-11-09 VITALS — BP 118/70 | HR 90 | Ht 65.0 in | Wt 205.2 lb

## 2020-11-09 DIAGNOSIS — L508 Other urticaria: Secondary | ICD-10-CM | POA: Diagnosis not present

## 2020-11-09 DIAGNOSIS — R002 Palpitations: Secondary | ICD-10-CM

## 2020-11-09 NOTE — Progress Notes (Signed)
Cardiology Office Note:    Date:  11/09/2020   ID:  Kimberly Esparza, DOB [birth year], MRN [id number]  PCP:  Earlyne Iba, NP  Cardiologist:  Shirlee More, MD    Referring MD: Earlyne Iba, NP    ASSESSMENT:    1. Palpitations   2. Urticaria, chronic    PLAN:    In order of problems listed above:  1. At this time I think the best approach is reassurance she is having symptomatic sinus tachycardia and I think drug therapy has the potential being disadvantageous with her chronic urticaria.   Next appointment: As needed   Medication Adjustments/Labs and Tests Ordered: Current medicines are reviewed at length with the patient today.  Concerns regarding medicines are outlined above.  No orders of the defined types were placed in this encounter.  No orders of the defined types were placed in this encounter.   Follow-up after monitor  History of Present Illness:    Kimberly Esparza is a 55 y.o. female with a hx of palpitation with  last seen 09/29/2020. Compliance with diet, lifestyle and medications: Yes  We reviewed her monitor I think it is normal she has no significant arrhythmia reviewed options for treatment I think beta-blocker is a poor choice to mitigate tachycardia with her recurrent urticaria and immunosuppressant agents and at this time he has been to give her another drug like a calcium channel blocker that can cause cutaneous side effects.  Both patient and I are reassured she notices her heart rate after activity seems rapid. Following that visit she used a 6-day event monitor which was unremarkable showing rare ectopy and her triggered and symptomatic events were associated with sinus tachycardia Past Medical History:  Diagnosis Date  . Depression   . GERD (gastroesophageal reflux disease)   . Hypothyroidism   . Multiple allergies   . Osteoarthritis     Past Surgical History:  Procedure Laterality Date  . ADENOIDECTOMY    . KNEE SURGERY Bilateral   .  PARTIAL HYSTERECTOMY    . SINOSCOPY    . TONSILLECTOMY    . TUBAL LIGATION    . WRIST SURGERY Bilateral     Current Medications: Current Meds  Medication Sig  . Ascorbic Acid (VITAMIN C) 1000 MG tablet Take 1,000 mg by mouth daily.  Marland Kitchen azelastine (ASTELIN) 0.1 % nasal spray Place 2 sprays into both nostrils 2 (two) times daily as needed for rhinitis. Use in each nostril as directed  . buPROPion (WELLBUTRIN XL) 300 MG 24 hr tablet Take 300 mg by mouth daily.  . cetirizine (ZYRTEC) 10 MG tablet Take 10 mg by mouth daily.  . Cholecalciferol (VITAMIN D) 125 MCG (5000 UT) CAPS Take 1 capsule by mouth daily.  Marland Kitchen DEXILANT 60 MG capsule Take 1 capsule by mouth daily.  . DUPIXENT 300 MG/2ML prefilled syringe Inject 2 mLs into the skin.  . Eszopiclone 3 MG TABS Take 3 mg by mouth at bedtime as needed.  . etodolac (LODINE) 500 MG tablet Take 500 mg by mouth 2 (two) times daily.  . FEROSUL 325 (65 Fe) MG tablet Take 325 mg by mouth at bedtime.  Marland Kitchen FLUoxetine (PROZAC) 20 MG capsule Take 20 mg by mouth daily.  . fluticasone (FLONASE) 50 MCG/ACT nasal spray Place 1 spray into the nose daily.  Marland Kitchen gabapentin (NEURONTIN) 400 MG capsule Take 400 mg by mouth at bedtime.  Marland Kitchen HYDROcodone-acetaminophen (NORCO/VICODIN) 5-325 MG tablet Take 1 tablet by mouth. for pain  .  lansoprazole (PREVACID) 30 MG capsule Take 30 mg by mouth daily as needed.  Marland Kitchen levocetirizine (XYZAL) 5 MG tablet TAKE ONE TABLET BY MOUTH TWICE DAILY AS DIRECTED  . levothyroxine (SYNTHROID) 88 MCG tablet Take 88 mcg by mouth daily.  . montelukast (SINGULAIR) 10 MG tablet Take 10 mg by mouth daily.  . Nutritional Supplements (ESTROVEN PO) Take by mouth.  . Zinc 50 MG CAPS Take by mouth.     Allergies:   Doxycycline   Social History   Socioeconomic History  . Marital status: Married    Spouse name: Not on file  . Number of children: Not on file  . Years of education: Not on file  . Highest education level: Not on file  Occupational  History  . Not on file  Tobacco Use  . Smoking status: Never Smoker  . Smokeless tobacco: Never Used  Substance and Sexual Activity  . Alcohol use: Not on file  . Drug use: Not on file  . Sexual activity: Not on file  Other Topics Concern  . Not on file  Social History Narrative  . Not on file   Social Determinants of Health   Financial Resource Strain: Not on file  Food Insecurity: Not on file  Transportation Needs: Not on file  Physical Activity: Not on file  Stress: Not on file  Social Connections: Not on file     Family History: The patient's family history includes Allergic rhinitis in her brother and father; Diabetes in her mother; Glaucoma in her mother; Gout in her brother and mother; Hypertension in her mother. ROS:   Please see the history of present illness.    All other systems reviewed and are negative.  EKGs/Labs/Other Studies Reviewed:    The following studies were reviewed today:  EKG:  EKG 09/29/2020 showed sinus rhythm normal  Recent labs 09/01/2020 glucose 89 creatinine 0.91 TSH 1.70 all normal  Physical Exam:    VS:  BP 118/70   Pulse 90   Ht 5\' 5"  (1.651 m)   Wt 205 lb 3.2 oz (93.1 kg)   SpO2 99%   BMI 34.15 kg/m     Wt Readings from Last 3 Encounters:  11/09/20 205 lb 3.2 oz (93.1 kg)  09/29/20 209 lb 12.8 oz (95.2 kg)  09/12/16 199 lb 6.4 oz (90.4 kg)     GEN:  Well nourished, well developed in no acute distress HEENT: Normal NECK: No JVD; No carotid bruits LYMPHATICS: No lymphadenopathy CARDIAC: RRR, no murmurs, rubs, gallops RESPIRATORY:  Clear to auscultation without rales, wheezing or rhonchi  ABDOMEN: Soft, non-tender, non-distended MUSCULOSKELETAL:  No edema; No deformity  SKIN: Warm and dry NEUROLOGIC:  Alert and oriented x 3 PSYCHIATRIC:  Normal affect    Signed, Shirlee More, MD  11/09/2020 2:08 PM    [facility]

## 2020-11-09 NOTE — Patient Instructions (Addendum)
Medication Instructions:  Your physician recommends that you continue on your current medications as directed. Please refer to the Current Medication list given to you today.  *If you need a refill on your cardiac medications before your next appointment, please call your pharmacy*   Lab Work: None If you have labs (blood work) drawn today and your tests are completely normal, you will receive your results only by: . MyChart Message (if you have MyChart) OR . A paper copy in the mail If you have any lab test that is abnormal or we need to change your treatment, we will call you to review the results.   Testing/Procedures: None   Follow-Up: At [facility], you and your health needs are our priority.  As part of our continuing mission to provide you with exceptional heart care, we have created designated Provider Care Teams.  These Care Teams include your primary Cardiologist (physician) and Advanced Practice Providers (APPs -  Physician Assistants and Nurse Practitioners) who all work together to provide you with the care you need, when you need it.  We recommend signing up for the patient portal called "MyChart".  Sign up information is provided on this After Visit Summary.  MyChart is used to connect with patients for Virtual Visits (Telemedicine).  Patients are able to view lab/test results, encounter notes, upcoming appointments, etc.  Non-urgent messages can be sent to your provider as well.   To learn more about what you can do with MyChart, go to [email or web address].    Your next appointment:   As needed  The format for your next appointment:   In Person  Provider:   Saidy Ormand, MD   Other Instructions 1. Avoid all over-the-counter antihistamines except Claritin/Loratadine and Zyrtec/Cetrizine. 2. Avoid all combination including cold sinus allergies flu decongestant and sleep medications 3. You can use Robitussin DM Mucinex and Mucinex DM for cough. 4. can use  Tylenol aspirin ibuprofen and naproxen but no combinations such as sleep or sinus. 

## 2020-12-21 ENCOUNTER — Other Ambulatory Visit: Payer: Self-pay | Admitting: Nurse Practitioner

## 2020-12-21 DIAGNOSIS — R928 Other abnormal and inconclusive findings on diagnostic imaging of breast: Secondary | ICD-10-CM

## 2020-12-23 ENCOUNTER — Ambulatory Visit: Payer: PRIVATE HEALTH INSURANCE | Admitting: Behavioral Health

## 2020-12-28 ENCOUNTER — Encounter: Payer: Self-pay | Admitting: Behavioral Health

## 2020-12-28 ENCOUNTER — Other Ambulatory Visit: Payer: Self-pay

## 2020-12-28 ENCOUNTER — Ambulatory Visit (INDEPENDENT_AMBULATORY_CARE_PROVIDER_SITE_OTHER): Payer: PRIVATE HEALTH INSURANCE | Admitting: Behavioral Health

## 2020-12-28 VITALS — BP 144/74 | HR 87 | Ht 65.0 in | Wt 210.0 lb

## 2020-12-28 DIAGNOSIS — F411 Generalized anxiety disorder: Secondary | ICD-10-CM

## 2020-12-28 DIAGNOSIS — F331 Major depressive disorder, recurrent, moderate: Secondary | ICD-10-CM | POA: Diagnosis not present

## 2020-12-28 DIAGNOSIS — F900 Attention-deficit hyperactivity disorder, predominantly inattentive type: Secondary | ICD-10-CM | POA: Diagnosis not present
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
## 2021-01-12 ENCOUNTER — Other Ambulatory Visit: Payer: Self-pay

## 2021-01-12 ENCOUNTER — Other Ambulatory Visit: Payer: PRIVATE HEALTH INSURANCE

## 2021-01-12 ENCOUNTER — Ambulatory Visit: Payer: PRIVATE HEALTH INSURANCE

## 2021-01-12 ENCOUNTER — Ambulatory Visit
Admission: RE | Admit: 2021-01-12 | Discharge: 2021-01-12 | Disposition: A | Discharge status: Home / Self Care | Payer: PRIVATE HEALTH INSURANCE | Source: Ambulatory Visit | Attending: Nurse Practitioner | Admitting: Nurse Practitioner

## 2021-01-12 DIAGNOSIS — R928 Other abnormal and inconclusive findings on diagnostic imaging of breast: Secondary | ICD-10-CM

## 2021-01-25 ENCOUNTER — Encounter: Payer: Self-pay | Admitting: Behavioral Health

## 2021-01-25 ENCOUNTER — Other Ambulatory Visit: Payer: Self-pay

## 2021-01-25 ENCOUNTER — Ambulatory Visit (INDEPENDENT_AMBULATORY_CARE_PROVIDER_SITE_OTHER): Payer: PRIVATE HEALTH INSURANCE | Admitting: Behavioral Health

## 2021-01-25 DIAGNOSIS — F411 Generalized anxiety disorder: Secondary | ICD-10-CM | POA: Diagnosis not present

## 2021-01-25 DIAGNOSIS — F339 Major depressive disorder, recurrent, unspecified: Secondary | ICD-10-CM | POA: Diagnosis not present

## 2021-01-25 DIAGNOSIS — F331 Major depressive disorder, recurrent, moderate: Secondary | ICD-10-CM

## 2021-01-25 MED ORDER — BUPROPION HCL ER (XL) 150 MG PO TB24: 300.0000 mg | ORAL_TABLET | Freq: Every day | ORAL | 1 refills | Status: DC

## 2021-01-25 MED ORDER — FLUOXETINE HCL 20 MG PO CAPS: 60.0000 mg | ORAL_CAPSULE | Freq: Every day | ORAL | 1 refills | Status: DC
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
## 2021-02-22 ENCOUNTER — Encounter: Payer: Self-pay | Admitting: Behavioral Health

## 2021-02-22 ENCOUNTER — Ambulatory Visit (INDEPENDENT_AMBULATORY_CARE_PROVIDER_SITE_OTHER): Payer: PRIVATE HEALTH INSURANCE | Admitting: Behavioral Health

## 2021-02-22 ENCOUNTER — Other Ambulatory Visit: Payer: Self-pay

## 2021-02-22 DIAGNOSIS — F411 Generalized anxiety disorder: Secondary | ICD-10-CM | POA: Diagnosis not present

## 2021-02-22 DIAGNOSIS — F900 Attention-deficit hyperactivity disorder, predominantly inattentive type: Secondary | ICD-10-CM

## 2021-02-22 DIAGNOSIS — F331 Major depressive disorder, recurrent, moderate: Secondary | ICD-10-CM | POA: Diagnosis not present

## 2021-02-22 MED ORDER — FLUOXETINE HCL 40 MG PO CAPS: ORAL_CAPSULE | ORAL | 2 refills | Status: DC

## 2021-02-22 NOTE — Progress Notes (Signed)
[facility]  Patient ID: Kimberly Esparza,  MRN: [id number]  PCP: Earlyne Iba, NP  Date of Evaluation: 02/22/2021 Time spent:30 minutes  Chief Complaint:   HISTORY/CURRENT STATUS: HPI 55 year old female presents to this office for follow up and medication management. She says, "I am doing ok". She says that she has noticed a slight difference since increasing Prozac to 60 mg but not where she would like to be with anxiety and depression. She still struggle with lack of drive and motivation. She said she was able to get out of the house recently to travel with her mother. However, other than that she is still staying home most of the time. She is willing to try final  increase of Prozac to 80 mg to see if it will be effective. She reports anxiety today at 4 and depression at 4. Reports sleeping well at 7-8 hours per night. No mania, no psychosis, no SI/HI.   Individual Medical History/ Review of Systems: Changes? :No   Allergies: Doxycycline  Current Medications:  Current Outpatient Medications:    FLUoxetine (PROZAC) 40 MG capsule, Take two capsules 80 mg daily., Disp: 60 capsule, Rfl: 2   Ascorbic Acid (VITAMIN C) 1000 MG tablet, Take 1,000 mg by mouth daily., Disp: , Rfl:    azelastine (ASTELIN) 0.1 % nasal spray, Place 2 sprays into both nostrils 2 (two) times daily as needed for rhinitis. Use in each nostril as directed, Disp: 30 mL, Rfl: 3   buPROPion (WELLBUTRIN XL) 150 MG 24 hr tablet, Take 2 tablets (300 mg total) by mouth daily., Disp: 60 tablet, Rfl: 1   cetirizine (ZYRTEC) 10 MG tablet, Take 10 mg by mouth daily., Disp: , Rfl:    Cholecalciferol (VITAMIN D) 125 MCG (5000 UT) CAPS, Take 1 capsule by mouth daily., Disp: , Rfl:    DEXILANT 60 MG capsule, Take 1 capsule by mouth daily., Disp: , Rfl: 0   DUPIXENT 300 MG/2ML prefilled syringe, Inject 2 mLs into the skin., Disp: , Rfl:    Eszopiclone 3 MG TABS, Take 3 mg by mouth at bedtime as needed., Disp: , Rfl:     etodolac (LODINE) 500 MG tablet, Take 500 mg by mouth 2 (two) times daily., Disp: , Rfl:    FEROSUL 325 (65 Fe) MG tablet, Take 325 mg by mouth at bedtime., Disp: , Rfl:    FLUoxetine (PROZAC) 20 MG capsule, Take 3 capsules (60 mg total) by mouth daily., Disp: 90 capsule, Rfl: 1   fluticasone (FLONASE) 50 MCG/ACT nasal spray, Place 1 spray into the nose daily., Disp: , Rfl:    gabapentin (NEURONTIN) 400 MG capsule, Take 400 mg by mouth at bedtime., Disp: , Rfl:    HYDROcodone-acetaminophen (NORCO/VICODIN) 5-325 MG tablet, Take 1 tablet by mouth. for pain (Patient not taking: Reported on 12/28/2020), Disp: , Rfl: 0   lansoprazole (PREVACID) 30 MG capsule, Take 30 mg by mouth daily as needed., Disp: , Rfl:    levocetirizine (XYZAL) 5 MG tablet, TAKE ONE TABLET BY MOUTH TWICE DAILY AS DIRECTED, Disp: 60 tablet, Rfl: 0   levothyroxine (SYNTHROID) 88 MCG tablet, Take 88 mcg by mouth daily., Disp: , Rfl: 3   montelukast (SINGULAIR) 10 MG tablet, Take 10 mg by mouth daily., Disp: , Rfl: 3   Nutritional Supplements (ESTROVEN PO), Take by mouth., Disp: , Rfl:    Zinc 50 MG CAPS, Take by mouth., Disp: , Rfl:  Medication Side Effects: anxiety  Family Medical/ Social History:  Changes? No  MENTAL HEALTH EXAM:  There were no vitals taken for this visit.There is no height or weight on file to calculate BMI.  General Appearance: Casual, Neat, and Well Groomed  Eye Contact:  Good  Speech:  Clear and Coherent  Volume:  Normal  Mood:  NA  Affect:  Appropriate  Thought Process:  Coherent  Orientation:  Full (Time, Place, and Person)  Thought Content: Logical   Suicidal Thoughts:  No  Homicidal Thoughts:  No  Memory:  WNL  Judgement:  Good  Insight:  Good  Psychomotor Activity:  Normal  Concentration:  Concentration: Good  Recall:  Good  Fund of Knowledge: Good  Language: Good  Assets:  Desire for Improvement  ADL's:  Intact  Cognition: WNL  Prognosis:  Good    DIAGNOSES:    ICD-10-CM   1.  Generalized anxiety disorder  F41.1 FLUoxetine (PROZAC) 40 MG capsule    2. Major depressive disorder, recurrent episode, moderate (HCC)  F33.1 FLUoxetine (PROZAC) 40 MG capsule    3. Attention deficit hyperactivity disorder (ADHD), predominantly inattentive type  F90.0       Receiving Psychotherapy: No /Has appointment Friday with [facility]   RECOMMENDATIONS:   Treatment Plan/Recommendations: To continue on current medication regimen except: Will increase Prozac from 60 mg to 80 mg daily. Will report worsening condition or SI. Recommended psychotherapy and/CBT or DBT. Patient will be seeing new therapist 6/24. Greater than 50% of 30 min face to face time with patient was spent on counseling and coordination of care. We discussed follow-up in six weeks to reassess and will consider medication change at that time if no improvement in depressive symptoms. Discussed with the patient about trying medications for treatment resistant depression.        Kimberly Brooklyn, NP

## 2021-04-05 ENCOUNTER — Other Ambulatory Visit: Payer: Self-pay

## 2021-04-05 ENCOUNTER — Encounter: Payer: Self-pay | Admitting: Behavioral Health

## 2021-04-05 ENCOUNTER — Ambulatory Visit (INDEPENDENT_AMBULATORY_CARE_PROVIDER_SITE_OTHER): Payer: PRIVATE HEALTH INSURANCE | Admitting: Behavioral Health

## 2021-04-05 DIAGNOSIS — F411 Generalized anxiety disorder: Secondary | ICD-10-CM | POA: Diagnosis not present

## 2021-04-05 DIAGNOSIS — F331 Major depressive disorder, recurrent, moderate: Secondary | ICD-10-CM

## 2021-04-05 DIAGNOSIS — F900 Attention-deficit hyperactivity disorder, predominantly inattentive type: Secondary | ICD-10-CM | POA: Diagnosis not present

## 2021-04-05 DIAGNOSIS — F339 Major depressive disorder, recurrent, unspecified: Secondary | ICD-10-CM | POA: Diagnosis not present

## 2021-04-05 MED ORDER — AMPHETAMINE-DEXTROAMPHET ER 10 MG PO CP24: 10.0000 mg | ORAL_CAPSULE | Freq: Every day | ORAL | 0 refills | Status: DC

## 2021-04-05 NOTE — Progress Notes (Signed)
[facility]  Patient ID: AONESTY SIMSON,  MRN: [id number]  PCP: Earlyne Iba, NP  Date of Evaluation: 04/05/2021 Time spent:30 minutes  Chief Complaint:  Chief Complaint   Anxiety; ADHD; Depression; Medication Refill; Follow-up     HISTORY/CURRENT STATUS: HPI  55 year old patient presents to this office for follow up and medication management. She says that she has been "doing a little bet better". Says she has been seeing new therapist [name] in [place], and has been about 4 visits so far. She says that she has noticed some improvement with increase in Prozac but not really sure. She wonders wonders if therapy may be helping with some of the anxiety by just getting things out. She says that she would like to approach her past history of ADHD again and reinitiate a stimulant. Says she has such a problem with reading, lack of focus, and unable to complete task. She wonders if this contributes to her anxiety and depressive symptoms. She reports her anxiety today at 4/10 and depression at 3/10. Says she is sleeping 7 hours plus per night. No mania, No psychosis. No SI/HI.    Past Psychiatric Medication Failures as reported by PT Abilify Lamictal Zoloft Paxil Lexapro Seroquel Celexa Effexor    Individual Medical History/ Review of Systems: Changes? :No   Allergies: Doxycycline  Current Medications:  Current Outpatient Medications:    amphetamine-dextroamphetamine (ADDERALL XR) 10 MG 24 hr capsule, Take 1 capsule (10 mg total) by mouth daily., Disp: 30 capsule, Rfl: 0   Ascorbic Acid (VITAMIN C) 1000 MG tablet, Take 1,000 mg by mouth daily., Disp: , Rfl:    azelastine (ASTELIN) 0.1 % nasal spray, Place 2 sprays into both nostrils 2 (two) times daily as needed for rhinitis. Use in each nostril as directed, Disp: 30 mL, Rfl: 3   buPROPion (WELLBUTRIN XL) 150 MG 24 hr tablet, Take 2 tablets (300 mg total) by mouth daily., Disp: 60 tablet, Rfl: 1   cetirizine  (ZYRTEC) 10 MG tablet, Take 10 mg by mouth daily., Disp: , Rfl:    Cholecalciferol (VITAMIN D) 125 MCG (5000 UT) CAPS, Take 1 capsule by mouth daily., Disp: , Rfl:    DEXILANT 60 MG capsule, Take 1 capsule by mouth daily., Disp: , Rfl: 0   DUPIXENT 300 MG/2ML prefilled syringe, Inject 2 mLs into the skin., Disp: , Rfl:    Eszopiclone 3 MG TABS, Take 3 mg by mouth at bedtime as needed., Disp: , Rfl:    etodolac (LODINE) 500 MG tablet, Take 500 mg by mouth 2 (two) times daily., Disp: , Rfl:    FEROSUL 325 (65 Fe) MG tablet, Take 325 mg by mouth at bedtime., Disp: , Rfl:    FLUoxetine (PROZAC) 40 MG capsule, Take two capsules 80 mg daily., Disp: 60 capsule, Rfl: 2   fluticasone (FLONASE) 50 MCG/ACT nasal spray, Place 1 spray into the nose daily., Disp: , Rfl:    gabapentin (NEURONTIN) 400 MG capsule, Take 400 mg by mouth at bedtime., Disp: , Rfl:    HYDROcodone-acetaminophen (NORCO/VICODIN) 5-325 MG tablet, Take 1 tablet by mouth. for pain (Patient not taking: Reported on 12/28/2020), Disp: , Rfl: 0   lansoprazole (PREVACID) 30 MG capsule, Take 30 mg by mouth daily as needed., Disp: , Rfl:    levocetirizine (XYZAL) 5 MG tablet, TAKE ONE TABLET BY MOUTH TWICE DAILY AS DIRECTED, Disp: 60 tablet, Rfl: 0   levothyroxine (SYNTHROID) 88 MCG tablet, Take 88 mcg by mouth daily.,  Disp: , Rfl: 3   montelukast (SINGULAIR) 10 MG tablet, Take 10 mg by mouth daily., Disp: , Rfl: 3   Nutritional Supplements (ESTROVEN PO), Take by mouth., Disp: , Rfl:    Zinc 50 MG CAPS, Take by mouth., Disp: , Rfl:  Medication Side Effects: none  Family Medical/ Social History: Changes? No  MENTAL HEALTH EXAM:  There were no vitals taken for this visit.There is no height or weight on file to calculate BMI.  General Appearance: Neat and Well Groomed  Eye Contact:  Good  Speech:  Clear and Coherent  Volume:  Normal  Mood:  Anxious  Affect:  Appropriate  Thought Process:  Coherent  Orientation:  Full (Time, Place, and  Person)  Thought Content: Logical   Suicidal Thoughts:  No  Homicidal Thoughts:  No  Memory:  WNL  Judgement:  Good  Insight:  Good  Psychomotor Activity:  Normal  Concentration:  Concentration: Good  Recall:  Good  Fund of Knowledge: Good  Language: Good  Assets:  Desire for Improvement  ADL's:  Intact  Cognition: WNL  Prognosis:  Good    DIAGNOSES:    ICD-10-CM   1. Generalized anxiety disorder  F41.1     2. Major depressive disorder, recurrent episode, moderate (HCC)  F33.1     3. Attention deficit hyperactivity disorder (ADHD), predominantly inattentive type  F90.0 amphetamine-dextroamphetamine (ADDERALL XR) 10 MG 24 hr capsule    4. Recurrent major depression resistant to treatment [facility])  F33.9       Receiving Psychotherapy: Yes    RECOMMENDATIONS:   Treatment Plan/Recommendations: To continue on current medication regimen except: Start Adderall 10 mg XR daily  Continue Prozac 80 mg daily. Will report worsening condition or SI. Patient recently started psychotherapy 4 visits Follow up in 4 weeks to reassess Greater than 50% of 30 min face to face time with patient was spent on counseling and coordination of care. Pt wanted to address past diagnosis 20 plus year ago with ADHD. She is concerned that this may be part of the problem. Agreed to try pt on Adderall to see if concentration and inability to complete task improve. Still discussing the possibility of switching from Prozac if no response. Pt recently completed [employer] testing and was in the green for most formulary AD and anti-psychotic or mood stabilizers.  Discussed potential benefits, risks, and side effects of stimulants with patient to include increased heart rate, palpitations, insomnia, increased anxiety, increased irritability, or decreased appetite.  Instructed patient to contact office if experiencing any significant tolerability issues.  Requested Pt check BP daily and weekly weights. She reports no  history of arrhythmias or serious cardiac issues. She agrees to follow up regularly with PCP for heart health. Reviewed PDMP    [name], NP

## 2021-05-03 ENCOUNTER — Encounter: Payer: Self-pay | Admitting: Behavioral Health

## 2021-05-03 ENCOUNTER — Ambulatory Visit (INDEPENDENT_AMBULATORY_CARE_PROVIDER_SITE_OTHER): Payer: PRIVATE HEALTH INSURANCE | Admitting: Behavioral Health

## 2021-05-03 ENCOUNTER — Other Ambulatory Visit: Payer: Self-pay

## 2021-05-03 DIAGNOSIS — F411 Generalized anxiety disorder: Secondary | ICD-10-CM

## 2021-05-03 DIAGNOSIS — F339 Major depressive disorder, recurrent, unspecified: Secondary | ICD-10-CM

## 2021-05-03 DIAGNOSIS — F331 Major depressive disorder, recurrent, moderate: Secondary | ICD-10-CM

## 2021-05-03 DIAGNOSIS — F900 Attention-deficit hyperactivity disorder, predominantly inattentive type: Secondary | ICD-10-CM

## 2021-05-03 MED ORDER — FLUOXETINE HCL 40 MG PO CAPS: ORAL_CAPSULE | ORAL | 3 refills | Status: DC

## 2021-05-03 MED ORDER — AMPHETAMINE-DEXTROAMPHET ER 20 MG PO CP24: 20.0000 mg | ORAL_CAPSULE | Freq: Every day | ORAL | 0 refills | Status: DC

## 2021-05-03 MED ORDER — BUPROPION HCL ER (XL) 150 MG PO TB24: 300.0000 mg | ORAL_TABLET | Freq: Every day | ORAL | 3 refills | Status: DC

## 2021-05-03 NOTE — Progress Notes (Addendum)
[facility]  Patient ID: Kimberly Esparza,  MRN: [id number]  PCP: Kimberly Iba, NP  Date of Evaluation: 05/03/2021 Time spent:30 minutes  Chief Complaint:  Chief Complaint   ADHD; Medication Refill; Follow-up; Anxiety; Depression     HISTORY/CURRENT STATUS: HPI 55 year old patient presents to this office for follow up and medication management. She says that she has been "doing a little bet better". Says she has been seeing new therapist Kimberly Esparza in [place], and has been about 4 visits so far. Says he recently released her saying that he was moving to another location. She said that he proclaimed he could not do anymore for her. Says she has notice minimal improvement with Adderall but notices some crash in the afternoon. She was upset about losing therapist and felt it important to continue. She would also like to continue increase in her Adderall this visit. She reports her anxiety today at 4/10 and depression at 3/10. Says she is sleeping 7 hours plus per night. No mania, No psychosis. No SI/HI.    Past Psychiatric Medication Failures as reported by PT Abilify Lamictal Zoloft Paxil Lexapro Seroquel Celexa Effexor   Individual Medical History/ Review of Systems: Changes? :No   Allergies: Doxycycline  Current Medications:  Current Outpatient Medications:    amphetamine-dextroamphetamine (ADDERALL XR) 20 MG 24 hr capsule, Take 1 capsule (20 mg total) by mouth daily., Disp: 30 capsule, Rfl: 0   Ascorbic Acid (VITAMIN C) 1000 MG tablet, Take 1,000 mg by mouth daily., Disp: , Rfl:    azelastine (ASTELIN) 0.1 % nasal spray, Place 2 sprays into both nostrils 2 (two) times daily as needed for rhinitis. Use in each nostril as directed, Disp: 30 mL, Rfl: 3   buPROPion (WELLBUTRIN XL) 150 MG 24 hr tablet, Take 2 tablets (300 mg total) by mouth daily., Disp: 60 tablet, Rfl: 3   cetirizine (ZYRTEC) 10 MG tablet, Take 10 mg by mouth daily., Disp: , Rfl:     Cholecalciferol (VITAMIN D) 125 MCG (5000 UT) CAPS, Take 1 capsule by mouth daily., Disp: , Rfl:    DEXILANT 60 MG capsule, Take 1 capsule by mouth daily., Disp: , Rfl: 0   DUPIXENT 300 MG/2ML prefilled syringe, Inject 2 mLs into the skin., Disp: , Rfl:    Eszopiclone 3 MG TABS, Take 3 mg by mouth at bedtime as needed., Disp: , Rfl:    etodolac (LODINE) 500 MG tablet, Take 500 mg by mouth 2 (two) times daily., Disp: , Rfl:    FEROSUL 325 (65 Fe) MG tablet, Take 325 mg by mouth at bedtime., Disp: , Rfl:    FLUoxetine (PROZAC) 40 MG capsule, Take two capsules 80 mg daily., Disp: 60 capsule, Rfl: 3   fluticasone (FLONASE) 50 MCG/ACT nasal spray, Place 1 spray into the nose daily., Disp: , Rfl:    gabapentin (NEURONTIN) 400 MG capsule, Take 400 mg by mouth at bedtime., Disp: , Rfl:    HYDROcodone-acetaminophen (NORCO/VICODIN) 5-325 MG tablet, Take 1 tablet by mouth. for pain (Patient not taking: Reported on 12/28/2020), Disp: , Rfl: 0   lansoprazole (PREVACID) 30 MG capsule, Take 30 mg by mouth daily as needed., Disp: , Rfl:    levocetirizine (XYZAL) 5 MG tablet, TAKE ONE TABLET BY MOUTH TWICE DAILY AS DIRECTED, Disp: 60 tablet, Rfl: 0   levothyroxine (SYNTHROID) 88 MCG tablet, Take 88 mcg by mouth daily., Disp: , Rfl: 3   montelukast (SINGULAIR) 10 MG tablet, Take 10 mg by mouth daily.,  Disp: , Rfl: 3   Nutritional Supplements (ESTROVEN PO), Take by mouth., Disp: , Rfl:    Zinc 50 MG CAPS, Take by mouth., Disp: , Rfl:  Medication Side Effects: none  Family Medical/ Social History: Changes? No  MENTAL HEALTH EXAM:  There were no vitals taken for this visit.There is no height or weight on file to calculate BMI.  General Appearance: Casual, Neat, and Well Groomed  Eye Contact:  Good  Speech:  Clear and Coherent  Volume:  Normal  Mood:  Anxious  Affect:  Appropriate and Anxious  Thought Process:  Coherent  Orientation:  Full (Time, Place, and Person)  Thought Content: Logical   Suicidal  Thoughts:  No  Homicidal Thoughts:  No  Memory:  WNL  Judgement:  Good  Insight:  Good  Psychomotor Activity:  Normal  Concentration:  Concentration: Good  Recall:  Good  Fund of Knowledge: Good  Language: Good  Assets:  Desire for Improvement  ADL's:  Intact  Cognition: WNL  Prognosis:  Good    DIAGNOSES:    ICD-10-CM   1. Generalized anxiety disorder  F41.1 amphetamine-dextroamphetamine (ADDERALL XR) 20 MG 24 hr capsule    FLUoxetine (PROZAC) 40 MG capsule    2. Major depressive disorder, recurrent episode, moderate (HCC)  F33.1 amphetamine-dextroamphetamine (ADDERALL XR) 20 MG 24 hr capsule    buPROPion (WELLBUTRIN XL) 150 MG 24 hr tablet    FLUoxetine (PROZAC) 40 MG capsule    3. Attention deficit hyperactivity disorder (ADHD), predominantly inattentive type  F90.0 amphetamine-dextroamphetamine (ADDERALL XR) 20 MG 24 hr capsule    4. Recurrent major depression resistant to treatment ([place])  F33.9 buPROPion (WELLBUTRIN XL) 150 MG 24 hr tablet      Receiving Psychotherapy: No  Not currently, therapist moving. She is looking for new therapist.   RECOMMENDATIONS:    Increase Adderall to 20 mg XR daily  Continue Prozac 80 mg daily. Continue Wellbutrin 150 mg XL daily Continue Eszopiclone 3 mg tabs at bedtime Will report worsening condition or SI. Patient recently started psychotherapy 4 visits And therapist released her because he said he is moving.  Greater than 50% of 30 min face to face time with patient was spent on counseling and coordination of care. Pt wanted to address past diagnosis 20 plus year ago with ADHD. She is concerned that this may be part of the problem. Agreed to try pt on Adderall to see if concentration and inability to complete task improve. Still discussing the possibility of switching from Prozac if no response. Pt recently completed [employer] testing and was in the green for most formulary AD and anti-psychotic or mood stabilizers.  Did have  candid conversation with patient about considering BPD as possible dx due to lack of response to many medication and therapy. Discussed importance of long term therapy and recommended she find therapist skilled in CBT/DBT.  Discussed potential benefits, risks, and side effects of stimulants with patient to include increased heart rate, palpitations, insomnia, increased anxiety, increased irritability, or decreased appetite.  Instructed patient to contact office if experiencing any significant tolerability issues.  Requested Pt check BP daily and weekly weights. She reports no history of arrhythmias or serious cardiac issues. She agrees to follow up regularly with PCP for heart health. Reviewed PDMP    [name], NP

## 2021-05-31 ENCOUNTER — Ambulatory Visit: Payer: PRIVATE HEALTH INSURANCE | Admitting: Behavioral Health

## 2021-06-03 ENCOUNTER — Telehealth (INDEPENDENT_AMBULATORY_CARE_PROVIDER_SITE_OTHER): Payer: PRIVATE HEALTH INSURANCE | Admitting: Behavioral Health

## 2021-06-03 ENCOUNTER — Encounter: Payer: Self-pay | Admitting: Behavioral Health

## 2021-06-03 DIAGNOSIS — F900 Attention-deficit hyperactivity disorder, predominantly inattentive type: Secondary | ICD-10-CM

## 2021-06-03 DIAGNOSIS — F411 Generalized anxiety disorder: Secondary | ICD-10-CM | POA: Diagnosis not present

## 2021-06-03 DIAGNOSIS — F339 Major depressive disorder, recurrent, unspecified: Secondary | ICD-10-CM

## 2021-06-03 DIAGNOSIS — F331 Major depressive disorder, recurrent, moderate: Secondary | ICD-10-CM

## 2021-06-03 DIAGNOSIS — F39 Unspecified mood [affective] disorder: Secondary | ICD-10-CM

## 2021-06-03 MED ORDER — CARIPRAZINE HCL 1.5 MG PO CAPS: 1.5000 mg | ORAL_CAPSULE | Freq: Every day | ORAL | 1 refills | Status: DC

## 2021-06-03 NOTE — Progress Notes (Signed)
Kimberly Esparza [id number] 18-Mar-1966 55 y.o.  Virtual Visit via Video Note  I connected with pt @ on 06/03/21 at  1:45 PM EDT by a video enabled telemedicine application and verified that I am speaking with the correct person using two identifiers.   I discussed the limitations of evaluation and management by telemedicine and the availability of in person appointments. The patient expressed understanding and agreed to proceed.  I discussed the assessment and treatment plan with the patient. The patient was provided an opportunity to ask questions and all were answered. The patient agreed with the plan and demonstrated an understanding of the instructions.   The patient was advised to call back or seek an in-person evaluation if the symptoms worsen or if the condition fails to improve as anticipated.  I provided 30  minutes of non-face-to-face time during this encounter.  The patient was located at home.  The provider was located at [place].   [name], NP   Subjective:   Patient ID:  Kimberly Esparza is a 55 y.o. (DOB 1966-02-21) female.  Chief Complaint:  Chief Complaint  Patient presents with   Anxiety   Depression   Follow-up   Medication Refill   Medication Problem    HPI  Kimberly Esparza presents for follow-up and medication management. She says that her anxiety and depression has improved slightly but still persistent. She says she is ready to try a different class of medication because nothing has seemed to work. She understands that her medication trial has been lengthy with nothing apparently working well or long term. She says that her anxiety today is 5/10 and depression is 5/10. Says she still has days where she does not want to do anything and has no motivation.  She says that she reduced her Prozac and Wellbutrin by half anticipating a change with medication. She is sleeping 7-8 hours at night. No mania, no psychosis, No SI/HI.    Past Psychiatric  Medication Failures as reported by PT Abilify Lamictal Zoloft Paxil Lexapro Seroquel Celexa Effexor      Review of Systems:  Review of Systems  Constitutional: Negative.   Cardiovascular:  Negative for palpitations.  Allergic/Immunologic: Negative.   Neurological:  Negative for tremors and weakness.  Psychiatric/Behavioral:  Positive for decreased concentration, dysphoric mood and sleep disturbance. The patient is nervous/anxious.    Medications: I have reviewed the patient's current medications.  Current Outpatient Medications  Medication Sig Dispense Refill   cariprazine (VRAYLAR) 1.5 MG capsule Take 1 capsule (1.5 mg total) by mouth daily. 30 capsule 1   amphetamine-dextroamphetamine (ADDERALL XR) 20 MG 24 hr capsule Take 1 capsule (20 mg total) by mouth daily. 30 capsule 0   Ascorbic Acid (VITAMIN C) 1000 MG tablet Take 1,000 mg by mouth daily.     azelastine (ASTELIN) 0.1 % nasal spray Place 2 sprays into both nostrils 2 (two) times daily as needed for rhinitis. Use in each nostril as directed 30 mL 3   buPROPion (WELLBUTRIN XL) 150 MG 24 hr tablet Take 2 tablets (300 mg total) by mouth daily. 60 tablet 3   cetirizine (ZYRTEC) 10 MG tablet Take 10 mg by mouth daily.     Cholecalciferol (VITAMIN D) 125 MCG (5000 UT) CAPS Take 1 capsule by mouth daily.     DEXILANT 60 MG capsule Take 1 capsule by mouth daily.  0   DUPIXENT 300 MG/2ML prefilled syringe Inject 2 mLs into the skin.  Eszopiclone 3 MG TABS Take 3 mg by mouth at bedtime as needed.     etodolac (LODINE) 500 MG tablet Take 500 mg by mouth 2 (two) times daily.     FEROSUL 325 (65 Fe) MG tablet Take 325 mg by mouth at bedtime.     FLUoxetine (PROZAC) 40 MG capsule Take two capsules 80 mg daily. 60 capsule 3   fluticasone (FLONASE) 50 MCG/ACT nasal spray Place 1 spray into the nose daily.     gabapentin (NEURONTIN) 400 MG capsule Take 400 mg by mouth at bedtime.     HYDROcodone-acetaminophen (NORCO/VICODIN) 5-325  MG tablet Take 1 tablet by mouth. for pain (Patient not taking: Reported on 12/28/2020)  0   lansoprazole (PREVACID) 30 MG capsule Take 30 mg by mouth daily as needed.     levocetirizine (XYZAL) 5 MG tablet TAKE ONE TABLET BY MOUTH TWICE DAILY AS DIRECTED 60 tablet 0   levothyroxine (SYNTHROID) 88 MCG tablet Take 88 mcg by mouth daily.  3   montelukast (SINGULAIR) 10 MG tablet Take 10 mg by mouth daily.  3   Nutritional Supplements (ESTROVEN PO) Take by mouth.     Zinc 50 MG CAPS Take by mouth.     No current facility-administered medications for this visit.    Medication Side Effects: None  Allergies:  Allergies  Allergen Reactions   Doxycycline Other (See Comments)    Other reaction(s): GI Upset (intolerance)    Past Medical History:  Diagnosis Date   ADHD (attention deficit hyperactivity disorder)    Anxiety    Depression    GERD (gastroesophageal reflux disease)    Hypothyroidism    Multiple allergies    Osteoarthritis     Family History  Problem Relation Age of Onset   Hypertension Mother    Diabetes Mother    Glaucoma Mother    Gout Mother    Allergic rhinitis Father    Allergic rhinitis Brother    Gout Brother     Social History   Socioeconomic History   Marital status: Married    Spouse name: Not on file   Number of children: Not on file   Years of education: Not on file   Highest education level: Not on file  Occupational History   Not on file  Tobacco Use   Smoking status: Never   Smokeless tobacco: Never  Substance and Sexual Activity   Alcohol use: Yes    Comment: rarely   Drug use: Never   Sexual activity: Not Currently    Partners: Male    Comment: Married but no interest  Other Topics Concern   Not on file  Social History Narrative   Not on file   Social Determinants of Health   Financial Resource Strain: Not on file  Food Insecurity: Not on file  Transportation Needs: Not on file  Physical Activity: Not on file  Stress: Not on  file  Social Connections: Not on file  Intimate Partner Violence: Not on file    Past Medical History, Surgical history, Social history, and Family history were reviewed and updated as appropriate.   Please see review of systems for further details on the patient's review from today.   Objective:   Physical Exam:  There were no vitals taken for this visit.  Physical Exam Neurological:     Mental Status: She is alert and oriented to person, place, and time.  Psychiatric:        Attention and Perception: Attention and  perception normal.        Mood and Affect: Mood normal.        Speech: Speech normal.        Behavior: Behavior normal. Behavior is cooperative.        Cognition and Memory: Cognition and memory normal.        Judgment: Judgment normal.     Comments: Insight intact    Lab Review:     Component Value Date/Time   NA 141 09/28/2008 1144   K 3.9 09/28/2008 1144   GLUCOSE 90 09/28/2008 1144       Component Value Date/Time   HGB 13.1 10/12/2008 1226   HCT 45.0 09/28/2008 1144    No results found for: POCLITH, LITHIUM   No results found for: PHENYTOIN, PHENOBARB, VALPROATE, CBMZ   .res Assessment: Plan:    Ellan was seen today for anxiety, depression, follow-up, medication refill and medication problem.  Diagnoses and all orders for this visit:  Generalized anxiety disorder -     cariprazine (VRAYLAR) 1.5 MG capsule; Take 1 capsule (1.5 mg total) by mouth daily.  Major depressive disorder, recurrent episode, moderate (HCC) -     cariprazine (VRAYLAR) 1.5 MG capsule; Take 1 capsule (1.5 mg total) by mouth daily.  Attention deficit hyperactivity disorder (ADHD), predominantly inattentive type  Recurrent major depression resistant to treatment ([place]) -     cariprazine (VRAYLAR) 1.5 MG capsule; Take 1 capsule (1.5 mg total) by mouth daily.  Unspecified mood (affective) disorder (HCC) -     cariprazine (VRAYLAR) 1.5 MG capsule; Take 1 capsule (1.5 mg  total) by mouth daily.  To start Vraylar 1.5 mg daily Continue Adderall 20 mg XR daily  Decreased Prozac to 40 mg daily. Continue Wellbutrin 150 mg XL daily Continue Eszopiclone 3 mg tabs at bedtime Will report worsening condition or SI.   Greater than 50% of 30 min face to face time with patient was spent on counseling and coordination of care.  We discussed her minimal improvement after initiating a stimulant medication for trial of ADHD.  Pt has long history of minimal response to many AD medications. Pt recently completed Gene[employer] testing and was in the green for most formulary AD and anti-psychotic or mood stabilizers.  Did have candid conversation with patient about considering BPD as possible dx due to lack of response to many medication and therapy. Discussed importance of long term therapy and recommended she find therapist skilled in CBT/DBT.  Discussed potential metabolic side effects associated with atypical antipsychotics, as well as potential risk for movement side effects. Advised pt to contact office if movement side effects occur.   Discussed potential benefits, risks, and side effects of stimulants with patient to include increased heart rate, palpitations, insomnia, increased anxiety, increased irritability, or decreased appetite.  Instructed patient to contact office if experiencing any significant tolerability issues.  Requested Pt check BP daily and weekly weights. She reports no history of arrhythmias or serious cardiac issues. She agrees to follow up regularly with PCP for heart health. Reviewed PDMP     Please see After Visit Summary for patient specific instructions.  No future appointments.  No orders of the defined types were placed in this encounter.     -------------------------------

## 2021-06-29 ENCOUNTER — Telehealth: Payer: Self-pay | Admitting: Behavioral Health

## 2021-06-29 NOTE — Telephone Encounter (Signed)
Understood, thank you. 

## 2021-06-29 NOTE — Telephone Encounter (Signed)
Patient called about her medications. She has stopped the Vraylar as she didn't like the way it made her feel.  She said she has decreased her Prozac to 20 mg and her Wellbutrin to 150, as she said you recommended.   She states she can't sit still. She states she is a toe-tapper anyway, but it feels like her whole body is keyed up.  She stated her ADD was worse, but that she was out of Adderall. She said she would not have taken it with the Vraylar anyway or she would be like [employer] running a marathon from here to [place]. She feels nervous. She has decreased her Klonopin back down, but was taking it TID to try to calm her down.   She is not sleeping well. Anxiety is increased. She states she is hungry all the time. She feels off balance in her head, but wonders if that could be sinuses. She is not off balance in her gait. She said you had talked about Latuda and possibly having the same issues with that.   Please advise.

## 2021-06-29 NOTE — Telephone Encounter (Signed)
If she is willing, please tell her to continue the Vraylar and take 1.5 mg tablet every other day. Reduce the Wellbutrin to every other day for one week and then stop. Continue the Prozac 20 mg daily.  Try this for a couple of weeks to see if symptoms improve.

## 2021-06-29 NOTE — Telephone Encounter (Signed)
Called patient and relayed the information from [place]. She stated understanding and would try the recommendations, but she really did not like the way Vraylar made her feel.

## 2021-06-29 NOTE — Telephone Encounter (Signed)
Pt LVM stating Aaron Edelman recently prescribed a new med for her and she is not doing well on it.  She would like a call back.  Last visit 9/30; no upcoming visit scheduled

## 2021-08-11 ENCOUNTER — Telehealth (INDEPENDENT_AMBULATORY_CARE_PROVIDER_SITE_OTHER): Payer: No Typology Code available for payment source | Admitting: Behavioral Health

## 2021-08-11 ENCOUNTER — Encounter: Payer: Self-pay | Admitting: Behavioral Health

## 2021-08-11 DIAGNOSIS — F411 Generalized anxiety disorder: Secondary | ICD-10-CM

## 2021-08-11 DIAGNOSIS — F900 Attention-deficit hyperactivity disorder, predominantly inattentive type: Secondary | ICD-10-CM | POA: Diagnosis not present

## 2021-08-11 DIAGNOSIS — F39 Unspecified mood [affective] disorder: Secondary | ICD-10-CM

## 2021-08-11 DIAGNOSIS — F339 Major depressive disorder, recurrent, unspecified: Secondary | ICD-10-CM

## 2021-08-11 DIAGNOSIS — F331 Major depressive disorder, recurrent, moderate: Secondary | ICD-10-CM

## 2021-08-11 MED ORDER — CLONAZEPAM 0.5 MG PO TABS: 0.5000 mg | ORAL_TABLET | Freq: Two times a day (BID) | ORAL | 1 refills | Status: DC | PRN

## 2021-08-11 MED ORDER — CARIPRAZINE HCL 3 MG PO CAPS: 3.0000 mg | ORAL_CAPSULE | Freq: Every day | ORAL | 2 refills | Status: DC

## 2021-08-11 NOTE — Progress Notes (Signed)
Kimberly Esparza [id number] 1966/05/03 55 y.o.  Virtual Visit via Video Note  I connected with pt @ on 08/11/21 at 10:30 AM EST by a video enabled telemedicine application and verified that I am speaking with the correct person using two identifiers.   I discussed the limitations of evaluation and management by telemedicine and the availability of in person appointments. The patient expressed understanding and agreed to proceed.  I discussed the assessment and treatment plan with the patient. The patient was provided an opportunity to ask questions and all were answered. The patient agreed with the plan and demonstrated an understanding of the instructions.   The patient was advised to call back or seek an in-person evaluation if the symptoms worsen or if the condition fails to improve as anticipated.  I provided 30  minutes of non-face-to-face time during this encounter.  The patient was located at home.  The provider was located at [place].   Kimberly [place], NP   Subjective:   Patient ID:  Kimberly Esparza is a 55 y.o. (DOB Jul 16, 1966) female.  Chief Complaint:  Chief Complaint  Patient presents with   Anxiety   Depression   Follow-up   Medication Refill   Medication Problem    HPI  [name] presents for follow-up and medication management. She says that her depression has improved significantly but anxiety still lingers.  Says that she has had to take more Klonopin recently. Feels like the Wellbutrin helped her with her food cravings but understands that stimulant like medications including Adderall aggravated the anxiety. She is frustrated and says that she has gained 18 pounds. She says that her anxiety today is 6/10 and depression is 3/10. Says she still has days where she does not want to do anything and has no motivation.  She says she has also weaned off the Prozac because it wasn't working anyway. She says she is willing to try dose increase of the  Vraylar.  She is sleeping 7-8 hours at night. No mania, no psychosis, No SI/HI.    Past Psychiatric Medication Failures as reported by PT  Abilify Lamictal Zoloft Paxil Lexapro Seroquel Celexa Effexor Wellbutrin Adderall      Review of Systems:  Review of Systems  Constitutional: Negative.   Cardiovascular:  Negative for palpitations.  Allergic/Immunologic: Negative.   Neurological:  Negative for tremors and weakness.  Psychiatric/Behavioral:  Positive for dysphoric mood. The patient is nervous/anxious.    Medications: I have reviewed the patient's current medications.  Current Outpatient Medications  Medication Sig Dispense Refill   cariprazine (VRAYLAR) 3 MG capsule Take 1 capsule (3 mg total) by mouth daily. 30 capsule 2   clonazePAM (KLONOPIN) 0.5 MG tablet Take 1 tablet (0.5 mg total) by mouth 2 (two) times daily as needed for anxiety. 2 tablet 1   amphetamine-dextroamphetamine (ADDERALL XR) 20 MG 24 hr capsule Take 1 capsule (20 mg total) by mouth daily. 30 capsule 0   Ascorbic Acid (VITAMIN C) 1000 MG tablet Take 1,000 mg by mouth daily.     azelastine (ASTELIN) 0.1 % nasal spray Place 2 sprays into both nostrils 2 (two) times daily as needed for rhinitis. Use in each nostril as directed 30 mL 3   buPROPion (WELLBUTRIN XL) 150 MG 24 hr tablet Take 2 tablets (300 mg total) by mouth daily. 60 tablet 3   cariprazine (VRAYLAR) 1.5 MG capsule Take 1 capsule (1.5 mg total) by mouth daily. 30 capsule 1   cetirizine (ZYRTEC)  10 MG tablet Take 10 mg by mouth daily.     Cholecalciferol (VITAMIN D) 125 MCG (5000 UT) CAPS Take 1 capsule by mouth daily.     DEXILANT 60 MG capsule Take 1 capsule by mouth daily.  0   DUPIXENT 300 MG/2ML prefilled syringe Inject 2 mLs into the skin.     Eszopiclone 3 MG TABS Take 3 mg by mouth at bedtime as needed.     etodolac (LODINE) 500 MG tablet Take 500 mg by mouth 2 (two) times daily.     FEROSUL 325 (65 Fe) MG tablet Take 325 mg by mouth at  bedtime.     FLUoxetine (PROZAC) 40 MG capsule Take two capsules 80 mg daily. 60 capsule 3   fluticasone (FLONASE) 50 MCG/ACT nasal spray Place 1 spray into the nose daily.     gabapentin (NEURONTIN) 400 MG capsule Take 400 mg by mouth at bedtime.     HYDROcodone-acetaminophen (NORCO/VICODIN) 5-325 MG tablet Take 1 tablet by mouth. for pain (Patient not taking: Reported on 12/28/2020)  0   lansoprazole (PREVACID) 30 MG capsule Take 30 mg by mouth daily as needed.     levocetirizine (XYZAL) 5 MG tablet TAKE ONE TABLET BY MOUTH TWICE DAILY AS DIRECTED 60 tablet 0   levothyroxine (SYNTHROID) 88 MCG tablet Take 88 mcg by mouth daily.  3   montelukast (SINGULAIR) 10 MG tablet Take 10 mg by mouth daily.  3   Nutritional Supplements (ESTROVEN PO) Take by mouth.     Zinc 50 MG CAPS Take by mouth.     No current facility-administered medications for this visit.    Medication Side Effects: None  Allergies:  Allergies  Allergen Reactions   Doxycycline Other (See Comments)    Other reaction(s): GI Upset (intolerance)    Past Medical History:  Diagnosis Date   ADHD (attention deficit hyperactivity disorder)    Anxiety    Depression    GERD (gastroesophageal reflux disease)    Hypothyroidism    Multiple allergies    Osteoarthritis     Family History  Problem Relation Age of Onset   Hypertension Mother    Diabetes Mother    Glaucoma Mother    Gout Mother    Allergic rhinitis Father    Allergic rhinitis Brother    Gout Brother     Social History   Socioeconomic History   Marital status: Married    Spouse name: Not on file   Number of children: Not on file   Years of education: Not on file   Highest education level: Not on file  Occupational History   Not on file  Tobacco Use   Smoking status: Never   Smokeless tobacco: Never  Substance and Sexual Activity   Alcohol use: Yes    Comment: rarely   Drug use: Never   Sexual activity: Not Currently    Partners: Male     Comment: Married but no interest  Other Topics Concern   Not on file  Social History Narrative   Not on file   Social Determinants of Health   Financial Resource Strain: Not on file  Food Insecurity: Not on file  Transportation Needs: Not on file  Physical Activity: Not on file  Stress: Not on file  Social Connections: Not on file  Intimate Partner Violence: Not on file    Past Medical History, Surgical history, Social history, and Family history were reviewed and updated as appropriate.   Please see review of  systems for further details on the patient's review from today.   Objective:   Physical Exam:  There were no vitals taken for this visit.  Physical Exam Neurological:     Mental Status: She is alert and oriented to person, place, and time.  Psychiatric:        Attention and Perception: Attention and perception normal.        Mood and Affect: Mood normal.        Speech: Speech normal.        Behavior: Behavior normal. Behavior is cooperative.        Cognition and Memory: Cognition and memory normal.        Judgment: Judgment normal.     Comments: Insight intact    Lab Review:     Component Value Date/Time   NA 141 09/28/2008 1144   K 3.9 09/28/2008 1144   GLUCOSE 90 09/28/2008 1144       Component Value Date/Time   HGB 13.1 10/12/2008 1226   HCT 45.0 09/28/2008 1144    No results found for: POCLITH, LITHIUM   No results found for: PHENYTOIN, PHENOBARB, VALPROATE, CBMZ   .res Assessment: Plan:    Shakeda was seen today for anxiety, depression, follow-up, medication refill and medication problem.  Diagnoses and all orders for this visit:  Generalized anxiety disorder -     clonazePAM (KLONOPIN) 0.5 MG tablet; Take 1 tablet (0.5 mg total) by mouth 2 (two) times daily as needed for anxiety. -     cariprazine (VRAYLAR) 3 MG capsule; Take 1 capsule (3 mg total) by mouth daily.  Major depressive disorder, recurrent episode, moderate (HCC) -      cariprazine (VRAYLAR) 3 MG capsule; Take 1 capsule (3 mg total) by mouth daily.  Attention deficit hyperactivity disorder (ADHD), predominantly inattentive type  Recurrent major depression resistant to treatment ([place]) -     cariprazine (VRAYLAR) 3 MG capsule; Take 1 capsule (3 mg total) by mouth daily.  Unspecified mood (affective) disorder (HCC) -     cariprazine (VRAYLAR) 3 MG capsule; Take 1 capsule (3 mg total) by mouth daily.    Please see After Visit Summary for patient specific instructions.  No future appointments.  No orders of the defined types were placed in this encounter.  Recommendation/Plan  Greater than 50% of 30 min face to face time with patient was spent on counseling and coordination of care. We discussed her significant improvement with depression but is still reporting severe anxiety and having to take Klonopin more Pt has long history of minimal response to many ADHD medications and it appears to make anxiety worse. However, she has experience weight gain lately reporting 18 lbs gain. She says her cravings are bad but I counseled her on how important for her to manage nutrition limiting carbs and fats.  I also stressed importance for her to get outdoors as much as possible and get exercise during winter months. I explained that we have attempted many medications and when we see some results we need to stay the course. She is in agreement for dosage increase of Vraylar .  Pt recently completed [employer] testing and was in the green for most formulary AD and anti-psychotic or mood stabilizers.  I reinforced again about importance of CBT with skilled therapist and possible dx of BPD due to limited success with medication and history of dysfunctional personal and working relationships in the past. Also she has had very limited response and success with many  different medications.   Discussed potential metabolic side effects associated with atypical antipsychotics, as well as  potential risk for movement side effects. Advised pt to contact office if movement side effects occur.   Discussed potential benefits, risks, and side effects of stimulants with patient to include increased heart rate, palpitations, insomnia, increased anxiety, increased irritability, or decreased appetite.  Instructed patient to contact office if experiencing any significant tolerability issues.  Discussed potential benefits, risk, and side effects of benzodiazepines to include potential risk of tolerance and dependence, as well as possible drowsiness.  Advised patient not to drive if experiencing drowsiness and to take lowest possible effective dose to minimize risk of dependence and tolerance.  Requested Pt check BP daily and weekly weights. She reports no history of arrhythmias or serious cardiac issues. She agrees to follow up regularly with PCP for heart health. Reviewed PDMP        -------------------------------

## 2021-08-16 ENCOUNTER — Telehealth: Payer: Self-pay | Admitting: Behavioral Health

## 2021-08-16 MED ORDER — CLONAZEPAM 0.5 MG PO TABS
0.5000 mg | ORAL_TABLET | Freq: Two times a day (BID) | ORAL | 1 refills | Status: DC | PRN
Start: 2021-08-16 — End: 2022-07-24

## 2021-08-16 NOTE — Telephone Encounter (Signed)
Pt LVM stating she went to pick up her Klonopin and was only able to get 2 pills.  Pls call her back.  Last visit 12/8; no new appt scheduled yet

## 2021-08-16 NOTE — Telephone Encounter (Signed)
Pls forward to [place].

## 2021-08-16 NOTE — Addendum Note (Signed)
Addended by: Lesle Chris A on: 08/16/2021 05:44 PM   Modules accepted: Orders

## 2021-08-16 NOTE — Telephone Encounter (Signed)
Is there a reason why you only sent 2 tabs?Or was it a mistake

## 2021-08-16 NOTE — Telephone Encounter (Signed)
Please review

## 2021-11-21 ENCOUNTER — Other Ambulatory Visit: Payer: Self-pay | Admitting: Nurse Practitioner

## 2021-11-21 DIAGNOSIS — Z1231 Encounter for screening mammogram for malignant neoplasm of breast: Secondary | ICD-10-CM

## 2021-11-23 ENCOUNTER — Ambulatory Visit: Payer: PRIVATE HEALTH INSURANCE

## 2021-11-24 ENCOUNTER — Ambulatory Visit
Admission: RE | Admit: 2021-11-24 | Discharge: 2021-11-24 | Disposition: A | Discharge status: Home / Self Care | Payer: PRIVATE HEALTH INSURANCE | Source: Ambulatory Visit | Attending: Nurse Practitioner | Admitting: Nurse Practitioner

## 2021-11-24 DIAGNOSIS — Z1231 Encounter for screening mammogram for malignant neoplasm of breast: Secondary | ICD-10-CM

## 2022-05-03 ENCOUNTER — Other Ambulatory Visit: Payer: Self-pay | Admitting: Behavioral Health

## 2022-05-03 DIAGNOSIS — F331 Major depressive disorder, recurrent, moderate: Secondary | ICD-10-CM

## 2022-05-03 DIAGNOSIS — F411 Generalized anxiety disorder: Secondary | ICD-10-CM

## 2022-06-15 DIAGNOSIS — C9000 Multiple myeloma not having achieved remission: Secondary | ICD-10-CM

## 2022-06-17 DIAGNOSIS — C9000 Multiple myeloma not having achieved remission: Secondary | ICD-10-CM | POA: Diagnosis not present

## 2022-06-17 DIAGNOSIS — I361 Nonrheumatic tricuspid (valve) insufficiency: Secondary | ICD-10-CM | POA: Diagnosis not present

## 2022-06-19 ENCOUNTER — Telehealth: Payer: Self-pay | Admitting: Oncology

## 2022-06-19 ENCOUNTER — Other Ambulatory Visit (HOSPITAL_COMMUNITY)
Admission: RE | Admit: 2022-06-19 | Discharge: 2022-06-19 | Disposition: A | Discharge status: Home / Self Care | Payer: 59 | Source: Other Acute Inpatient Hospital | Attending: Internal Medicine | Admitting: Internal Medicine

## 2022-06-19 DIAGNOSIS — C9000 Multiple myeloma not having achieved remission: Secondary | ICD-10-CM | POA: Diagnosis present

## 2022-06-19 NOTE — Telephone Encounter (Signed)
Patient has been scheduled. Aware of appt date and time   Hospital Follow-up Received: Today Kimberly Esparza, CMA sent to Kimberly Esparza Dr. Federico Flake has been seeing this lady in the hospital. I'm not sure yet when she will be discharged, but if possible he wants to see her either Wednesday or Thursday.

## 2022-06-20 DIAGNOSIS — C9000 Multiple myeloma not having achieved remission: Secondary | ICD-10-CM | POA: Insufficient documentation

## 2022-06-20 NOTE — Progress Notes (Signed)
START ON PATHWAY REGIMEN - Multiple Myeloma and Other Plasma Cell Dyscrasias     A cycle is every 28 days:     Dexamethasone      Bortezomib      Cyclophosphamide   **Always confirm dose/schedule in your pharmacy ordering system**  Patient Characteristics: Multiple Myeloma, Newly Diagnosed, Transplant Eligible, High Risk Disease Classification: Multiple Myeloma R-ISS Staging: III Therapeutic Status: Newly Diagnosed Is Patient Eligible for Transplant<= Transplant Eligible Risk Status: High Risk Intent of Therapy: Curative Intent, Discussed with Patient

## 2022-06-20 NOTE — Progress Notes (Signed)
[facility] Initial Visit:  Patient Care Team: Earlyne Iba, NP as PCP - General (Nurse Practitioner)  CHIEF COMPLAINTS/PURPOSE OF CONSULTATION:  Oncology History   No history exists.    HISTORY OF PRESENTING ILLNESS: Kimberly Esparza 56 y.o. female is here because of  multiple myeloma Medical history notable for DHD, GERD, hypothyroidism, osteoarthritis, fibromyalgia, stomach polyps  June 14, 2022 through June 19 2022: Admitted to [facility] following several weeks of feeling poorly.  She has been to urgent care on several occasions during the weeks prior with complaints of facial pain and headache.  On each occasion she was given antibiotics and on 1 occasion Medrol Dosepak was added.  Patient was able to see PCP this week who drew labs which were noted to be abnormal she was subsequently directed to the emergency room. CT head demonstrated multiple lytic lesions throughout the bony calvarium suggesting neoplastic process.  Largest of these was in the left frontal calvarium.  There is a soft tissue mass extending from this large lytic lesion measuring 3.8 x 3.3 cm in size coarse calcifications extending into the scalp and extra-axial location left frontal region.  INR 1.2 PTT 26.3 WBC 9.1 hemoglobin 6.3 MCV 98 platelet count 73; 54 segs 4 bands 35 lymphs 3 monos 2 eos 2S.  Chemistries notable for sodium 132 creatinine 3.30 glucose 122 calcium 13.3 corrected to 14.4 ionized calcium 2.35 albumin 2.9 total protein 12.2 AST 63 ALT 73 Serum free kappa 3067 lambda 2.3 with a kappa lambda 1333.  Blood viscosity 5.0  24 hr urine showed loss of 1582 mg protein.  UPEP  showed loss of 1023 mg of paraprotein    Hypercalcemia was treated with dexamethasone, IV hydration, symptomatic and calcitonin  June 15 2022: CT chest abdomen pelvis contrast:  Diffuse moth-eaten appearance of the skeleton with bilateral  pathologic rib fractures of various ages. Expansile 2 cm  inferior left scapular lytic lesion. Small 2.0 cm soft tissue lesion in the right paraspinal region at the level of the fourth costovertebral  junction. Findings are compatible with suspected multiple myeloma.  Small dependent left pleural effusion Trace pericardial effusion.  No lymphadenopathy.   June 16 2022: Port-A-Cath placed by radiology  June 17, 2022: Cardiac echo.  LVEF 60 to 65%.  Right atrium moderately dilated.  Moderate tricuspid valve regurg.  IVC dilated.  Pulmonary artery systolic pressure moderately elevated at 54 mmHg  June 19 2022: Right posterior iliac crest bone marrow biopsy and aspirate Extensive involvement by atypical plasma cells  representing 70% of all cells in the aspirate associated with variably sized aggregates and diffuse sheets in the clot and biopsy sections.  The plasma cells display kappa light chain restriction consistent with plasma cell neoplasm  June 22 2022:  Post hospital follow up for management of multiple myeloma.    Reviewed results of labs and bone marrow biopsy with patient and family.    Review of Systems  Constitutional:  Positive for fatigue and unexpected weight change. Negative for chills and fever.       Anorectic.  Has lost weight over past few weeks owing to anorexia  HENT:   Negative for hearing loss, lump/mass, mouth sores, nosebleeds, sore throat, tinnitus, trouble swallowing and voice change.   Eyes:  Negative for eye problems and icterus.       Vision changes:  None  Respiratory:  Positive for chest tightness. Negative for cough, hemoptysis, shortness of breath and wheezing.  PND:  none Orthopnea:  none Has chest wall pain owing to myeloma   Cardiovascular:  Negative for chest pain, leg swelling and palpitations.       PND:  none Orthopnea:  none  Gastrointestinal:  Positive for constipation. Negative for abdominal distention, abdominal pain, blood in stool, diarrhea, nausea and vomiting.       Constipation  improved with treatment of hypecalcemia  Endocrine: Negative for hot flashes.       Cold intolerance:  none Heat intolerance:  none  Genitourinary:  Negative for bladder incontinence, difficulty urinating, dysuria, frequency, hematuria and nocturia.   Musculoskeletal:  Positive for arthralgias and myalgias. Negative for back pain, gait problem, neck pain and neck stiffness.       Arthralgias in shoulders, ribs, back which makes sleeping difficult.  Uses Oxycodone infrequently since it does not provide much relief  Skin:  Negative for itching, rash and wound.  Neurological:  Positive for extremity weakness and headaches. Negative for dizziness, gait problem, light-headedness, numbness, seizures and speech difficulty.  Hematological:  Negative for adenopathy. Does not bruise/bleed easily.  Psychiatric/Behavioral:  Positive for confusion and sleep disturbance. Negative for suicidal ideas. The patient is not nervous/anxious.        Thinking foggy despite improvement in hypercalcemia    MEDICAL HISTORY: Past Medical History:  Diagnosis Date   ADHD (attention deficit hyperactivity disorder)    Anxiety    Depression    GERD (gastroesophageal reflux disease)    Hypothyroidism    Multiple allergies    Osteoarthritis     SURGICAL HISTORY: Past Surgical History:  Procedure Laterality Date   ADENOIDECTOMY     KNEE SURGERY Bilateral    PARTIAL HYSTERECTOMY     SINOSCOPY     TONSILLECTOMY     TUBAL LIGATION     WRIST SURGERY Bilateral     SOCIAL HISTORY: Social History   Socioeconomic History   Marital status: Married    Spouse name: Not on file   Number of children: Not on file   Years of education: Not on file   Highest education level: Not on file  Occupational History   Not on file  Tobacco Use   Smoking status: Never   Smokeless tobacco: Never  Substance and Sexual Activity   Alcohol use: Yes    Comment: rarely   Drug use: Never   Sexual activity: Not Currently     Partners: Male    Comment: Married but no interest  Other Topics Concern   Not on file  Social History Narrative   Not on file   Social Determinants of Health   Financial Resource Strain: Not on file  Food Insecurity: Not on file  Transportation Needs: Not on file  Physical Activity: Not on file  Stress: Not on file  Social Connections: Not on file  Intimate Partner Violence: Not on file    FAMILY HISTORY Family History  Problem Relation Age of Onset   Hypertension Mother    Diabetes Mother    Glaucoma Mother    Gout Mother    Allergic rhinitis Father    Allergic rhinitis Brother    Gout Brother     ALLERGIES:  is allergic to doxycycline.  MEDICATIONS:  Current Outpatient Medications  Medication Sig Dispense Refill   amphetamine-dextroamphetamine (ADDERALL XR) 20 MG 24 hr capsule Take 1 capsule (20 mg total) by mouth daily. 30 capsule 0   Ascorbic Acid (VITAMIN C) 1000 MG tablet Take 1,000 mg by mouth  daily.     azelastine (ASTELIN) 0.1 % nasal spray Place 2 sprays into both nostrils 2 (two) times daily as needed for rhinitis. Use in each nostril as directed 30 mL 3   buPROPion (WELLBUTRIN XL) 150 MG 24 hr tablet Take 2 tablets (300 mg total) by mouth daily. 60 tablet 3   cariprazine (VRAYLAR) 1.5 MG capsule Take 1 capsule (1.5 mg total) by mouth daily. 30 capsule 1   cariprazine (VRAYLAR) 3 MG capsule Take 1 capsule (3 mg total) by mouth daily. 30 capsule 2   cetirizine (ZYRTEC) 10 MG tablet Take 10 mg by mouth daily.     Cholecalciferol (VITAMIN D) 125 MCG (5000 UT) CAPS Take 1 capsule by mouth daily.     clonazePAM (KLONOPIN) 0.5 MG tablet Take 1 tablet (0.5 mg total) by mouth 2 (two) times daily as needed for anxiety. 60 tablet 1   DEXILANT 60 MG capsule Take 1 capsule by mouth daily.  0   DUPIXENT 300 MG/2ML prefilled syringe Inject 2 mLs into the skin.     Eszopiclone 3 MG TABS Take 3 mg by mouth at bedtime as needed.     etodolac (LODINE) 500 MG tablet Take 500  mg by mouth 2 (two) times daily.     FEROSUL 325 (65 Fe) MG tablet Take 325 mg by mouth at bedtime.     FLUoxetine (PROZAC) 40 MG capsule Take two capsules 80 mg daily. 60 capsule 3   fluticasone (FLONASE) 50 MCG/ACT nasal spray Place 1 spray into the nose daily.     gabapentin (NEURONTIN) 400 MG capsule Take 400 mg by mouth at bedtime.     HYDROcodone-acetaminophen (NORCO/VICODIN) 5-325 MG tablet Take 1 tablet by mouth. for pain (Patient not taking: Reported on 12/28/2020)  0   lansoprazole (PREVACID) 30 MG capsule Take 30 mg by mouth daily as needed.     levocetirizine (XYZAL) 5 MG tablet TAKE ONE TABLET BY MOUTH TWICE DAILY AS DIRECTED 60 tablet 0   levothyroxine (SYNTHROID) 88 MCG tablet Take 88 mcg by mouth daily.  3   montelukast (SINGULAIR) 10 MG tablet Take 10 mg by mouth daily.  3   Nutritional Supplements (ESTROVEN PO) Take by mouth.     Zinc 50 MG CAPS Take by mouth.     No current facility-administered medications for this visit.    PHYSICAL EXAMINATION:  ECOG PERFORMANCE STATUS: 2 - Symptomatic, <50% confined to bed   There were no vitals filed for this visit.  There were no vitals filed for this visit.   Physical Exam Vitals and nursing note reviewed.  Constitutional:      General: She is not in acute distress.    Appearance: Normal appearance. She is obese. She is not toxic-appearing or diaphoretic.     Comments: Here with mother and daughter.  Less ill appearing than during hospitalization.  Uncomfortable.    HENT:     Head: Normocephalic and atraumatic.     Right Ear: External ear normal.     Left Ear: External ear normal.     Nose: Nose normal. No congestion or rhinorrhea.     Mouth/Throat:     Mouth: Mucous membranes are moist.  Eyes:     General: No scleral icterus.    Extraocular Movements: Extraocular movements intact.     Conjunctiva/sclera: Conjunctivae normal.     Pupils: Pupils are equal, round, and reactive to light.  Cardiovascular:     Rate and  Rhythm: Normal rate  and regular rhythm.     Heart sounds: Normal heart sounds. No murmur heard.    No friction rub. No gallop.  Pulmonary:     Effort: Pulmonary effort is normal. No respiratory distress.     Breath sounds: Normal breath sounds. No stridor. No wheezing or rales.  Chest:     Chest wall: Tenderness present.  Abdominal:     General: Bowel sounds are normal. There is no distension.     Palpations: Abdomen is soft.     Tenderness: There is no abdominal tenderness. There is no guarding or rebound.  Musculoskeletal:        General: No swelling, tenderness or deformity.     Cervical back: Normal range of motion and neck supple. No rigidity or tenderness.     Right lower leg: Edema present.     Left lower leg: Edema present.  Lymphadenopathy:     Head:     Right side of head: No submental, submandibular, tonsillar, preauricular, posterior auricular or occipital adenopathy.     Left side of head: No submental, submandibular, tonsillar, preauricular, posterior auricular or occipital adenopathy.     Cervical: No cervical adenopathy.     Right cervical: No superficial, deep or posterior cervical adenopathy.    Left cervical: No superficial, deep or posterior cervical adenopathy.     Upper Body:     Right upper body: No supraclavicular, axillary, pectoral or epitrochlear adenopathy.     Left upper body: No supraclavicular, axillary, pectoral or epitrochlear adenopathy.  Skin:    General: Skin is warm.     Coloration: Skin is not jaundiced.     Findings: No bruising, erythema or rash.  Neurological:     General: No focal deficit present.     Mental Status: She is alert and oriented to person, place, and time. Mental status is at baseline.     Cranial Nerves: No cranial nerve deficit.     Sensory: No sensory deficit.  Psychiatric:        Mood and Affect: Mood normal.        Behavior: Behavior normal.        Thought Content: Thought content normal.        Judgment: Judgment  normal.      LABORATORY DATA: I have personally reviewed the data as listed:  No visits with results within 1 Month(s) from this visit.  Latest known visit with results is:  Office Visit on 09/12/2016  Component Date Value Ref Range Status   Class Description Allergens 09/12/2016 Comment   Final   Comment:     Levels of Specific IgE       Class  Description of Class     ---------------------------  -----  --------------------                    < 0.10         0         Negative            0.10 -    0.31         0/I       Equivocal/Low            0.32 -    0.55         I         Low            0.56 -    1.40  II        Moderate            1.41 -    3.90         III       High            3.91 -   19.00         IV        Very High           19.01 -  100.00         V         Very High                   >100.00         VI        Very High    IgE (Immunoglobulin E), Serum 09/12/2016 8  0 - 100 IU/mL Final   D Pteronyssinus IgE 09/12/2016 <0.10  Class 0 kU/L Final   D Farinae IgE 09/12/2016 <0.10  Class 0 kU/L Final   Cat Dander IgE 09/12/2016 <0.10  Class 0 kU/L Final   Dog Dander IgE 09/12/2016 <0.10  Class 0 kU/L Final   [place] Grass IgE 09/12/2016 <0.10  Class 0 kU/L Final   Timothy Grass IgE 09/12/2016 <0.10  Class 0 kU/L Final   Johnson Grass IgE 09/12/2016 <0.10  Class 0 kU/L Final   Cockroach, German IgE 09/12/2016 <0.10  Class 0 kU/L Final   Penicillium Chrysogen IgE 09/12/2016 <0.10  Class 0 kU/L Final   Cladosporium Herbarum IgE 09/12/2016 <0.10  Class 0 kU/L Final   Aspergillus Fumigatus IgE 09/12/2016 <0.10  Class 0 kU/L Final   Alternaria Alternata IgE 09/12/2016 <0.10  Class 0 kU/L Final   Maple/Box Elder IgE 09/12/2016 <0.10  Class 0 kU/L Final   Common Silver Wendee Copp IgE 09/12/2016 <0.10  Class 0 kU/L Final   Cedar, [place] IgE 09/12/2016 <0.10  Class 0 kU/L Final   Oak, White IgE 09/12/2016 <0.10  Class 0 kU/L Final   Elm, American IgE 09/12/2016 <0.10   Class 0 kU/L Final   Cottonwood IgE 09/12/2016 <0.10  Class 0 kU/L Final   Pecan, Hickory IgE 09/12/2016 <0.10  Class 0 kU/L Final   White Mulberry IgE 09/12/2016 <0.10  Class 0 kU/L Final   Ragweed, Short IgE 09/12/2016 <0.10  Class 0 kU/L Final   Pigweed, Rough IgE 09/12/2016 <0.10  Class 0 kU/L Final   Sheep Sorrel IgE Qn 09/12/2016 <0.10  Class 0 kU/L Final   Mouse Urine IgE 09/12/2016 <0.10  Class 0 kU/L Final    RADIOGRAPHIC STUDIES: I have personally reviewed the radiological images as listed and agree with the findings in the report  No results found.  ASSESSMENT/PLAN 56 year old female who on June 14 2022 presented with acute renal failure, hypercalcemia, elevated total protein in setting of hypoalbuminemia, cytopenias (anemia, thrombocytopenia), lytic bone lesions which has lead to the diagnosis of multiple myeloma.    Meets diagnostic criteria for myeloma by virtue of the following  Bone marrow bx with > 60% plasma cells (these show kappa light chain restriction)   Hyper calcemia  Bone lesions  Cytopenias (anemia, thrombocytopenia)  Renal failure  Significantly elevated serum kappa light chains  Elevated total protein in setting of low albumin.     Awaiting formal results  of SPEP with IEP, quantitative immunoglobulins.  Have ordered UPEP with IEP on spot urine Have also ordered  repeat CBC with diff, CMP, and beta 2 microglobulin.  Anticipate that patient will have Stage III disease by ISS  Hypercalcemia:  Secondary to myeloma.  Was treated with IVF, calcitonin, Dexamethasone, Zometa.  Remains on Dexamethasone 4 mg  daily for maintenance until chemotherapy for myeloma starts.    Pulmonary HTN:  Has been reported in Multiple Myeloma.  Multifactorial can be related to caused by pulmonary vasculopathy, endothelial dysfunction, pulmonary vascular amyloidosis, hyperviscosity.  Noted to have hyperviscosity on labs drawn during the hospitalization.  Not hypoxemic so doubt  PE  Cancer related pain:  Due to bone lesions from myeloma.  Will improve with treatment.      Poor venous access:  Underwent placement of port-a-cath by Radiology during the hospitalization in October 2023  Therapeutics:  Patient appears to have high risk disease.  She is a candidate for consideration of autologous stem cell transplant and will be treated as such.  Given the extent of her renal insufficiency do not favor use of Revlimid.  I have written for CyBorD and Zometa.  She will need chemotherapy teaching.  I have discussed risks and benefits of treatment with patient.  Anticipate that she will begin treatment next week.      Cancer Staging  No matching staging information was found for the patient.   No problem-specific Assessment & Plan notes found for this encounter.   No orders of the defined types were placed in this encounter.   All questions were answered. The patient knows to call the clinic with any problems, questions or concerns.  This note was electronically signed.    Barbee Cough, MD  06/20/2022 10:43 AM

## 2022-06-21 ENCOUNTER — Other Ambulatory Visit: Payer: Self-pay | Admitting: Pharmacist

## 2022-06-21 ENCOUNTER — Encounter: Payer: Self-pay | Admitting: Oncology

## 2022-06-21 DIAGNOSIS — C9000 Multiple myeloma not having achieved remission: Secondary | ICD-10-CM

## 2022-06-21 LAB — SURGICAL PATHOLOGY

## 2022-06-22 ENCOUNTER — Inpatient Hospital Stay: Payer: 59 | Admitting: Hematology and Oncology

## 2022-06-22 ENCOUNTER — Inpatient Hospital Stay: Payer: 59

## 2022-06-22 ENCOUNTER — Inpatient Hospital Stay: Payer: 59 | Attending: Oncology | Admitting: Oncology

## 2022-06-22 ENCOUNTER — Encounter: Payer: Self-pay | Admitting: Oncology

## 2022-06-22 ENCOUNTER — Other Ambulatory Visit: Payer: Self-pay

## 2022-06-22 VITALS — BP 144/68 | HR 90 | Resp 18 | Ht 65.4 in | Wt 180.4 lb

## 2022-06-22 DIAGNOSIS — M791 Myalgia, unspecified site: Secondary | ICD-10-CM | POA: Insufficient documentation

## 2022-06-22 DIAGNOSIS — R410 Disorientation, unspecified: Secondary | ICD-10-CM | POA: Insufficient documentation

## 2022-06-22 DIAGNOSIS — Z79890 Hormone replacement therapy (postmenopausal): Secondary | ICD-10-CM | POA: Insufficient documentation

## 2022-06-22 DIAGNOSIS — J90 Pleural effusion, not elsewhere classified: Secondary | ICD-10-CM | POA: Diagnosis not present

## 2022-06-22 DIAGNOSIS — C9000 Multiple myeloma not having achieved remission: Secondary | ICD-10-CM

## 2022-06-22 DIAGNOSIS — M1990 Unspecified osteoarthritis, unspecified site: Secondary | ICD-10-CM | POA: Insufficient documentation

## 2022-06-22 DIAGNOSIS — K219 Gastro-esophageal reflux disease without esophagitis: Secondary | ICD-10-CM | POA: Diagnosis not present

## 2022-06-22 DIAGNOSIS — G479 Sleep disorder, unspecified: Secondary | ICD-10-CM | POA: Insufficient documentation

## 2022-06-22 DIAGNOSIS — Z881 Allergy status to other antibiotic agents status: Secondary | ICD-10-CM | POA: Insufficient documentation

## 2022-06-22 DIAGNOSIS — M2550 Pain in unspecified joint: Secondary | ICD-10-CM | POA: Diagnosis not present

## 2022-06-22 DIAGNOSIS — R0789 Other chest pain: Secondary | ICD-10-CM | POA: Diagnosis not present

## 2022-06-22 DIAGNOSIS — E039 Hypothyroidism, unspecified: Secondary | ICD-10-CM | POA: Diagnosis not present

## 2022-06-22 DIAGNOSIS — D630 Anemia in neoplastic disease: Secondary | ICD-10-CM | POA: Diagnosis not present

## 2022-06-22 DIAGNOSIS — M797 Fibromyalgia: Secondary | ICD-10-CM | POA: Insufficient documentation

## 2022-06-22 DIAGNOSIS — N179 Acute kidney failure, unspecified: Secondary | ICD-10-CM

## 2022-06-22 DIAGNOSIS — I3139 Other pericardial effusion (noninflammatory): Secondary | ICD-10-CM | POA: Diagnosis not present

## 2022-06-22 DIAGNOSIS — D696 Thrombocytopenia, unspecified: Secondary | ICD-10-CM | POA: Diagnosis not present

## 2022-06-22 DIAGNOSIS — R531 Weakness: Secondary | ICD-10-CM | POA: Insufficient documentation

## 2022-06-22 DIAGNOSIS — I272 Pulmonary hypertension, unspecified: Secondary | ICD-10-CM | POA: Diagnosis not present

## 2022-06-22 DIAGNOSIS — R519 Headache, unspecified: Secondary | ICD-10-CM | POA: Insufficient documentation

## 2022-06-22 DIAGNOSIS — G893 Neoplasm related pain (acute) (chronic): Secondary | ICD-10-CM

## 2022-06-22 DIAGNOSIS — K5900 Constipation, unspecified: Secondary | ICD-10-CM | POA: Insufficient documentation

## 2022-06-22 DIAGNOSIS — R5383 Other fatigue: Secondary | ICD-10-CM | POA: Insufficient documentation

## 2022-06-22 DIAGNOSIS — Z79899 Other long term (current) drug therapy: Secondary | ICD-10-CM | POA: Diagnosis not present

## 2022-06-22 DIAGNOSIS — E8809 Other disorders of plasma-protein metabolism, not elsewhere classified: Secondary | ICD-10-CM | POA: Diagnosis not present

## 2022-06-22 LAB — CBC WITH DIFFERENTIAL (CANCER CENTER ONLY)
Abs Immature Granulocytes: 1.22 10*3/uL — ABNORMAL HIGH (ref 0.00–0.07)
Basophils Absolute: 0.1 10*3/uL (ref 0.0–0.1)
Basophils Relative: 1 %
Eosinophils Absolute: 0.1 10*3/uL (ref 0.0–0.5)
Eosinophils Relative: 1 %
HCT: 34 % — ABNORMAL LOW (ref 36.0–46.0)
Hemoglobin: 11 g/dL — ABNORMAL LOW (ref 12.0–15.0)
Immature Granulocytes: 15 %
Lymphocytes Relative: 28 %
Lymphs Abs: 2.4 10*3/uL (ref 0.7–4.0)
MCH: 28.4 pg (ref 26.0–34.0)
MCHC: 32.4 g/dL (ref 30.0–36.0)
MCV: 87.9 fL (ref 80.0–100.0)
Monocytes Absolute: 0.4 10*3/uL (ref 0.1–1.0)
Monocytes Relative: 4 %
Neutro Abs: 4.3 10*3/uL (ref 1.7–7.7)
Neutrophils Relative %: 51 %
Platelet Count: 73 10*3/uL — ABNORMAL LOW (ref 150–400)
RBC: 3.87 MIL/uL (ref 3.87–5.11)
RDW: 19.3 % — ABNORMAL HIGH (ref 11.5–15.5)
WBC Count: 8.4 10*3/uL (ref 4.0–10.5)
nRBC: 0.8 % — ABNORMAL HIGH (ref 0.0–0.2)

## 2022-06-22 LAB — CMP (CANCER CENTER ONLY)
ALT: 26 U/L (ref 0–44)
AST: 35 U/L (ref 15–41)
Albumin: 1.8 g/dL — ABNORMAL LOW (ref 3.5–5.0)
Alkaline Phosphatase: 70 U/L (ref 38–126)
Anion gap: 13 (ref 5–15)
BUN: 36 mg/dL — ABNORMAL HIGH (ref 6–20)
CO2: 23 mmol/L (ref 22–32)
Calcium: 8.7 mg/dL — ABNORMAL LOW (ref 8.9–10.3)
Chloride: 93 mmol/L — ABNORMAL LOW (ref 98–111)
Creatinine: 2.35 mg/dL — ABNORMAL HIGH (ref 0.44–1.00)
GFR, Estimated: 24 mL/min — ABNORMAL LOW (ref >60)
Glucose, Bld: 206 mg/dL — ABNORMAL HIGH (ref 70–99)
Potassium: 3.1 mmol/L — ABNORMAL LOW (ref 3.5–5.1)
Sodium: 129 mmol/L — ABNORMAL LOW (ref 135–145)
Total Bilirubin: 0.6 mg/dL (ref 0.3–1.2)
Total Protein: >12 g/dL — ABNORMAL HIGH (ref 6.5–8.1)

## 2022-06-22 MED ORDER — HEPARIN SOD (PORK) LOCK FLUSH 100 UNIT/ML IV SOLN
500.0000 [IU] | Freq: Once | INTRAVENOUS | Status: AC | PRN
  Administered 2022-06-22: 500 [IU]

## 2022-06-22 MED ORDER — ACYCLOVIR 400 MG PO TABS: 400.0000 mg | ORAL_TABLET | Freq: Two times a day (BID) | ORAL | 3 refills | Status: DC

## 2022-06-22 MED ORDER — SODIUM CHLORIDE 0.9% FLUSH
10.0000 mL | INTRAVENOUS | Status: DC | PRN
  Administered 2022-06-22: 10 mL

## 2022-06-22 MED ORDER — AMOXICILLIN 500 MG PO TABS: 500.0000 mg | ORAL_TABLET | ORAL | 0 refills | Status: DC

## 2022-06-22 MED ORDER — DEXAMETHASONE 4 MG PO TABS: ORAL_TABLET | ORAL | 3 refills | Status: DC

## 2022-06-22 NOTE — Progress Notes (Signed)
[facility] Telephone:([phone]   [phone]   Patient Care Team: Earlyne Iba, NP as PCP - General (Nurse Practitioner)   Name of the patient: Kimberly Esparza  [id number]  09-24-1965   Date of visit: 06/26/22  Diagnosis-  1. Multiple myeloma not having achieved remission ([place])   2. Erroneous encounter - disregard     Chief complaint/Reason for visit- Initial Meeting for [facility], preparing for starting chemotherapy  Heme/Onc history:  Oncology History  Multiple myeloma ([place])  06/20/2022 Initial Diagnosis   Multiple myeloma ([place])   06/27/2022 -  Chemotherapy   Patient is on Treatment Plan : MULTIPLE MYELOMA CyBorD - Weekly Bortezomib       Interval history-  The patient presents to chemo care clinic today for initial meeting in preparation for starting chemotherapy. I introduced the chemo care clinic and we discussed that the role of the clinic is to assist those who are at an increased risk of emergency room visits and/or complications during the course of chemotherapy treatment. We discussed that the increased risk takes into account factors such as age, performance status, and co-morbidities. We also discussed that for some, this might include barriers to care such as not having a primary care provider, lack of insurance/transportation, or not being able to afford medications. We discussed that the goal of the program is to help prevent unplanned ER visits and help reduce complications during chemotherapy. We do this by discussing specific risk factors to each individual and identifying ways that we can help improve these risk factors and reduce barriers to care.   Allergies  Allergen Reactions  . Doxycycline Other (See Comments)    Other reaction(s): GI Upset (intolerance)    Past Medical History:  Diagnosis Date  . ADHD (attention deficit hyperactivity disorder)   . Anxiety   . Depression   .  GERD (gastroesophageal reflux disease)   . Hypothyroidism   . Multiple allergies   . Osteoarthritis     Past Surgical History:  Procedure Laterality Date  . ADENOIDECTOMY    . BONE MARROW BIOPSY    . KNEE SURGERY Bilateral   . PARTIAL HYSTERECTOMY    . SINOSCOPY    . TONSILLECTOMY    . TUBAL LIGATION    . WRIST SURGERY Bilateral     Social History   Socioeconomic History  . Marital status: Married    Spouse name: Carloyn Manner  . Number of children: 2  . Years of education: 78  . Highest education level: Associate degree: academic program  Occupational History  . Not on file  Tobacco Use  . Smoking status: Never  . Smokeless tobacco: Never  Vaping Use  . Vaping Use: Never used  Substance and Sexual Activity  . Alcohol use: Yes    Comment: rarely  . Drug use: Never  . Sexual activity: Not Currently    Partners: Male    Comment: Married but no interest  Other Topics Concern  . Not on file  Social History Narrative  . Not on file   Social Determinants of Health   Financial Resource Strain: Not on file  Food Insecurity: Not on file  Transportation Needs: Not on file  Physical Activity: Not on file  Stress: Not on file  Social Connections: Not on file  Intimate Partner Violence: Not on file    Family History  Problem Relation Age of Onset  . Hypertension Mother   .  Diabetes Mother   . Glaucoma Mother   . Gout Mother   . Congestive Heart Failure Mother   . Allergic rhinitis Father   . Thyroid disease Father   . Allergic rhinitis Brother   . Gout Brother      Current Outpatient Medications:  .  amoxicillin (AMOXIL) 500 MG tablet, Take 1 tablet (500 mg total) by mouth as directed. Take 4 tabs prior to dental procedure, Disp: 4 tablet, Rfl: 0 .  acyclovir (ZOVIRAX) 400 MG tablet, Take 1 tablet (400 mg total) by mouth 2 (two) times daily., Disp: 60 tablet, Rfl: 3 .  azelastine (ASTELIN) 0.1 % nasal spray, Place 2 sprays into both nostrils 2 (two) times daily as  needed for rhinitis. Use in each nostril as directed, Disp: 30 mL, Rfl: 3 .  cetirizine (ZYRTEC) 10 MG tablet, Take 10 mg by mouth daily., Disp: , Rfl:  .  clonazePAM (KLONOPIN) 0.5 MG tablet, Take 1 tablet (0.5 mg total) by mouth 2 (two) times daily as needed for anxiety., Disp: 60 tablet, Rfl: 1 .  dexamethasone (DECADRON) 4 MG tablet, Take 4 mg by mouth daily., Disp: , Rfl:  .  dexamethasone (DECADRON) 4 MG tablet, Take 8 mg po daily for 2 days after Cytoxan chemotherapy., Disp: 30 tablet, Rfl: 3 .  DEXILANT 60 MG capsule, Take 1 capsule by mouth daily., Disp: , Rfl: 0 .  fluticasone (FLONASE) 50 MCG/ACT nasal spray, Place 1 spray into the nose daily., Disp: , Rfl:  .  gabapentin (NEURONTIN) 400 MG capsule, Take 400 mg by mouth at bedtime., Disp: , Rfl:  .  levocetirizine (XYZAL) 5 MG tablet, TAKE ONE TABLET BY MOUTH TWICE DAILY AS DIRECTED, Disp: 60 tablet, Rfl: 0 .  levothyroxine (SYNTHROID) 88 MCG tablet, Take 88 mcg by mouth daily., Disp: , Rfl: 3 .  ondansetron (ZOFRAN-ODT) 4 MG disintegrating tablet, Take 4 mg by mouth every 8 (eight) hours as needed., Disp: , Rfl:  .  promethazine (PHENERGAN) 25 MG tablet, Take 25 mg by mouth 2 (two) times daily as needed., Disp: , Rfl:      Latest Ref Rng & Units 06/22/2022    2:24 PM  CMP  Glucose 70 - 99 mg/dL 206   BUN 6 - 20 mg/dL 36   Creatinine 0.44 - 1.00 mg/dL 2.35   Sodium 135 - 145 mmol/L 129   Potassium 3.5 - 5.1 mmol/L 3.1   Chloride 98 - 111 mmol/L 93   CO2 22 - 32 mmol/L 23   Calcium 8.9 - 10.3 mg/dL 8.7   Total Protein 6.5 - 8.1 g/dL >12.0   Total Bilirubin 0.3 - 1.2 mg/dL 0.6   Alkaline Phos 38 - 126 U/L 70   AST 15 - 41 U/L 35   ALT 0 - 44 U/L 26       Latest Ref Rng & Units 06/22/2022    2:24 PM  CBC  WBC 4.0 - 10.5 K/uL 8.4   Hemoglobin 12.0 - 15.0 g/dL 11.0   Hematocrit 36.0 - 46.0 % 34.0   Platelets 150 - 400 K/uL 73     No images are attached to the encounter.  No results found.   Assessment and plan- The  patient is a 56 y.o. female who presents to [facility] for initial meeting in preparation for starting chemotherapy for the treatment of multiple myeloma.   Chemo Care Clinic/High Risk for ER/Hospitalization during chemotherapy- We discussed the role of the chemo care clinic and  identified patient specific risk factors. I discussed that patient was identified as high risk primarily based on:  Patient has past medical history positive for: Past Medical History:  Diagnosis Date  . ADHD (attention deficit hyperactivity disorder)   . Anxiety   . Depression   . GERD (gastroesophageal reflux disease)   . Hypothyroidism   . Multiple allergies   . Osteoarthritis     Patient has past surgical history positive for: Past Surgical History:  Procedure Laterality Date  . ADENOIDECTOMY    . BONE MARROW BIOPSY    . KNEE SURGERY Bilateral   . PARTIAL HYSTERECTOMY    . SINOSCOPY    . TONSILLECTOMY    . TUBAL LIGATION    . WRIST SURGERY Bilateral     Provided general information including the following:  1.  Date of education: 06/22/2022 2.  Physician name: Wanita Chamberlain, MD 3.  Diagnosis: Mild 4.  Stage: III 5.  Cure 6.  Chemotherapy plan including drugs and how often: Weekly bortezomib/cyclophosphamide/dexamethasone, zoledronic acid every 4 weeks 7.  Start date: 06/27/2022 8.  Other referrals: None at this time 9.  The patient is to call our office with any questions or concerns.  Our office number [phone], if after hours or on the weekend, call the same number and wait for the answering service.  There is always an oncologist on call. 10.  Medications prescribed: Acyclovir, dexamethasone 11.  The patient has verbalized understanding of the treatment plan and has no barriers to adherence or understanding.   Obtained signed consent from patient.   Discussed symptoms including:  1.  Low blood counts including red blood cells, white blood cells and platelets. 2. Infection  including to avoid large crowds, wash hands frequently, and stay away from people who were sick.  If fever develops of 100.4 or higher, call our office. 3.  Mucositis:  given instructions on mouth rinse (baking soda and salt mixture).  Keep mouth clean.  Use soft bristle toothbrush.  If mouth sores develop, call our clinic. 4.  Nausea/vomiting:  gave prescriptions for ondansetron 4 mg every 4 hours as needed for nausea, may take around the clock if persistent.  Prochlorperazine 10 mg every 6 hours, may take around the clock if persistent. 5.  Diarrhea: use over-the-counter Imodium.  Call clinic if not controlled. 6.  Constipation: use senna-S, 1 to 2 tablets twice a day.  If no BM in 2 to 3 days call the clinic. 7.  Loss of appetite:  try to eat small meals every 2-3 hours.  Call clinic if not eating or drinking. 8.  Taste changes:  zinc 500 mg daily.  If becomes severe call clinic. 9.  Avoid alcoholic beverages. 10.  Drink 2 to 3 quarts of water per day. Call clinic if not able to drink enough for urine to be pale yellow. 11.  Peripheral neuropathy: patient to call if numbness or tingling in hands or feet is persistent   Dexamethasone: The patient is currently on dexamethasone 4 mg daily.  She will discontinue this the day of chemotherapy.  She will then will take dexamethasone 4 mg 2 tablets daily for 2 days after chemotherapy.  The patient knows to take Acyclovir 400 mg twice daily in prevention of herpes zoster.   The patient was given written information printed from [employer] patient education on individual chemotherapy agents which includes: Name of medications Approved uses Dose and schedule Storage and handling Handling body fluids and waste Drug and food  interactions Possible side effects and management Pregnancy, sexual activity, and contraception Obtaining medication   Gave information on the supportive care team and how to contact them regarding services.  Discussed advanced  directives.  The patient does not have their advanced directives.   We discussed that social determinants of health may have significant impacts on health and outcomes for cancer patients.  Today we discussed specific social determinants of performance status, alcohol use, depression, financial needs, food insecurity, housing, interpersonal violence, social connections, stress, tobacco use, and transportation.    After lengthy discussion the following were identified as areas of need:   Outpatient services: We discussed options including home based and outpatient services, DME, nutrition counseling, and supportive care program. We discussed that patients who participate in regular physical activity report fewer negative impacts of cancer and treatments and report less fatigue.   Financial Concerns: We discussed that living with cancer can create tremendous financial burden.  We discussed options for assistance. I asked that if assistance is needed in affording medications or paying bills to please let us know so that we can provide assistance. We discussed options for food including social services.  Referral to Social work: Introduced Education officer, museum Mort Sawyers and the services she can provide, such as support with utility bill, cell phone and gas vouchers.  Also introduced to [employer], LCSW, our licensed clinical social worker who provides counseling as needed.  Support groups: We discussed options for support groups at [facility]. We discussed options for managing stress including healthy eating and exercise, as well as participating in no charge counseling services at the cancer center and support groups.  If these are of interest, patient can notify either myself or primary nursing team.We discussed options for management including medications.  Transportation: We discussed options for transportation.  The patient will contact our office if she requires assistance with  transportation.  Palliative care services: We have palliative care services available in the cancer center to discuss goals of care and advanced care planning.  Please let us know if you have any questions or would like to speak to our palliative care practitioner.  Symptom Management Clinic: We discussed our symptom management clinic which is available for acute concerns while receiving treatment such as nausea, vomiting or diarrhea.  We can be reached via telephone at [phone].  We are available for virtual or in person visits on the same day from 9 to 4 PM Monday through Friday.   She denies needing specific assistance at this time.  She will be followed by Dr. Jeralyn Bennett clinical team.   Disposition: RTC on 06/29/2022   Visit Diagnosis 1. Multiple myeloma not having achieved remission ([place])   2. Erroneous encounter - disregard     I discussed the assessment and treatment plan with the patient.  The patient was provided an opportunity to ask questions and all were answered. The patient expressed understanding and was in agreement with this plan. She also understands that she can call clinic at any time with any questions, concerns, or complaints.   I provided 30 minutes of face-to-face time during this encounter, and > 50% was spent counseling as documented under my assessment & plan.    A. Georgann Housekeeper, [place] [phone]  This encounter was created in error - please disregard.

## 2022-06-23 ENCOUNTER — Encounter: Payer: Self-pay | Admitting: Oncology

## 2022-06-23 ENCOUNTER — Encounter: Payer: Self-pay | Admitting: Hematology and Oncology

## 2022-06-23 DIAGNOSIS — D630 Anemia in neoplastic disease: Secondary | ICD-10-CM | POA: Insufficient documentation

## 2022-06-23 DIAGNOSIS — D696 Thrombocytopenia, unspecified: Secondary | ICD-10-CM | POA: Insufficient documentation

## 2022-06-23 DIAGNOSIS — G893 Neoplasm related pain (acute) (chronic): Secondary | ICD-10-CM | POA: Insufficient documentation

## 2022-06-23 DIAGNOSIS — N179 Acute kidney failure, unspecified: Secondary | ICD-10-CM | POA: Insufficient documentation

## 2022-06-23 LAB — BETA 2 MICROGLOBULIN, SERUM: Beta-2 Microglobulin: 17.7 mg/L — ABNORMAL HIGH (ref 0.6–2.4)

## 2022-06-26 ENCOUNTER — Encounter: Payer: Self-pay | Admitting: Oncology

## 2022-06-26 MED FILL — Dexamethasone Sodium Phosphate Inj 100 MG/10ML: INTRAMUSCULAR | Qty: 4 | Status: AC

## 2022-06-27 ENCOUNTER — Ambulatory Visit: Payer: 59

## 2022-06-27 ENCOUNTER — Other Ambulatory Visit: Payer: Self-pay

## 2022-06-27 ENCOUNTER — Encounter: Payer: Self-pay | Admitting: Oncology

## 2022-06-28 ENCOUNTER — Other Ambulatory Visit: Payer: Self-pay

## 2022-06-28 ENCOUNTER — Encounter (HOSPITAL_COMMUNITY): Payer: Self-pay | Admitting: Oncology

## 2022-06-28 NOTE — Progress Notes (Signed)
Ok to proceed with Velcade and Darzalex Faspro with pltc - 73 and Scr 2.35 from 06/22/22, vo Kelli.  Raul Del Hoehne, [place], BCPS, BCOP 06/28/2022 12:55 PM

## 2022-06-29 ENCOUNTER — Encounter (HOSPITAL_COMMUNITY): Payer: Self-pay | Admitting: Oncology

## 2022-06-29 ENCOUNTER — Other Ambulatory Visit: Payer: Self-pay | Admitting: Pharmacist

## 2022-06-29 ENCOUNTER — Other Ambulatory Visit: Payer: 59

## 2022-06-29 ENCOUNTER — Ambulatory Visit: Payer: 59 | Admitting: Oncology

## 2022-06-29 ENCOUNTER — Inpatient Hospital Stay: Payer: 59

## 2022-06-29 VITALS — BP 118/60 | HR 119 | Temp 97.9°F | Resp 20 | Wt 178.1 lb

## 2022-06-29 DIAGNOSIS — C9000 Multiple myeloma not having achieved remission: Secondary | ICD-10-CM | POA: Diagnosis not present

## 2022-06-29 MED ORDER — SODIUM CHLORIDE 0.9 % IV SOLN
40.0000 mg | Freq: Once | INTRAVENOUS | Status: AC
  Administered 2022-06-29: 40 mg via INTRAVENOUS
  Filled 2022-06-29 (×2): qty 4

## 2022-06-29 MED ORDER — SODIUM CHLORIDE 0.9 % IV SOLN
300.0000 mg/m2 | Freq: Once | INTRAVENOUS | Status: AC
  Administered 2022-06-29: 580 mg via INTRAVENOUS
  Filled 2022-06-29: qty 29

## 2022-06-29 MED ORDER — HEPARIN SOD (PORK) LOCK FLUSH 100 UNIT/ML IV SOLN
500.0000 [IU] | Freq: Once | INTRAVENOUS | Status: AC | PRN
  Administered 2022-06-29: 500 [IU]

## 2022-06-29 MED ORDER — SODIUM CHLORIDE 0.9% FLUSH
10.0000 mL | INTRAVENOUS | Status: DC | PRN
  Administered 2022-06-29: 10 mL

## 2022-06-29 MED ORDER — SODIUM CHLORIDE 0.9 % IV SOLN: Freq: Once | INTRAVENOUS | Status: AC

## 2022-06-29 MED ORDER — BORTEZOMIB CHEMO SQ INJECTION 3.5 MG (2.5MG/ML)
1.5000 mg/m2 | Freq: Once | INTRAMUSCULAR | Status: AC
  Administered 2022-06-29: 3 mg via SUBCUTANEOUS
  Filled 2022-06-29: qty 1.2

## 2022-06-29 MED ORDER — PALONOSETRON HCL INJECTION 0.25 MG/5ML
0.2500 mg | Freq: Once | INTRAVENOUS | Status: AC
  Administered 2022-06-29: 0.25 mg via INTRAVENOUS
  Filled 2022-06-29: qty 5

## 2022-06-29 NOTE — Progress Notes (Signed)
Ok to proceed with chemo today despite hemoglobin=6.8, platelet=55 and scr=2.6 per Dr. Federico Flake.  Patient had 1 unit of PRBC transfused at [facility] today.  She will have f/u and labs with Dr. Federico Flake on 10/31.

## 2022-06-29 NOTE — Patient Instructions (Signed)
[facility]  Discharge Instructions: Thank you for choosing [facility] to provide your oncology and hematology care.  If you have a lab appointment with the [facility], please go directly to the [facility] and check in at the registration area.   Wear comfortable clothing and clothing appropriate for easy access to any Portacath or PICC line.   We strive to give you quality time with your provider. You may need to reschedule your appointment if you arrive late (15 or more minutes).  Arriving late affects you and other patients whose appointments are after yours.  Also, if you miss three or more appointments without notifying the office, you may be dismissed from the clinic at the provider's discretion.      For prescription refill requests, have your pharmacy contact our office and allow 72 hours for refills to be completed.    Today you received the following chemotherapy and/or immunotherapy agents:Cytoxan and Velcade  For premedication you had Aloxi (Nausea prevention)_ and Dexamethasone ( Steroid)      To help prevent nausea and vomiting after your treatment, we encourage you to take your nausea medication as directed.  BELOW ARE SYMPTOMS THAT SHOULD BE REPORTED IMMEDIATELY: *FEVER GREATER THAN 100.4 F (38 C) OR HIGHER *CHILLS OR SWEATING *NAUSEA AND VOMITING THAT IS NOT CONTROLLED WITH YOUR NAUSEA MEDICATION *UNUSUAL SHORTNESS OF BREATH *UNUSUAL BRUISING OR BLEEDING *URINARY PROBLEMS (pain or burning when urinating, or frequent urination) *BOWEL PROBLEMS (unusual diarrhea, constipation, pain near the anus) TENDERNESS IN MOUTH AND THROAT WITH OR WITHOUT PRESENCE OF ULCERS (sore throat, sores in mouth, or a toothache) UNUSUAL RASH, SWELLING OR PAIN  UNUSUAL VAGINAL DISCHARGE OR ITCHING   Items with * indicate a potential emergency and should be followed up as soon as possible or go to the Emergency Department if any problems should  occur.  Please show the CHEMOTHERAPY ALERT CARD or IMMUNOTHERAPY ALERT CARD at check-in to the Emergency Department and triage nurse.  Should you have questions after your visit or need to cancel or reschedule your appointment, please contact [facility]  Dept: [phone]  and follow the prompts.  Office hours are 8:00 a.m. to 4:30 p.m. Monday - Friday. Please note that voicemails left after 4:00 p.m. may not be returned until the following business day.  We are closed weekends and major holidays. You have access to a nurse at all times for urgent questions. Please call the main number to the clinic Dept: [phone] and follow the prompts.  For any non-urgent questions, you may also contact your provider using MyChart. We now offer e-Visits for anyone 62 and older to request care online for non-urgent symptoms. For details visit [email or web address].   Also download the MyChart app! Go to the app store, search "MyChart", open the app, select [facility], and log in with your MyChart username and password.  Masks are optional in the cancer centers. If you would like for your care team to wear a mask while they are taking care of you, please let them know. You may have one support person who is at least 56 years old accompany you for your appointments. Cyclophosphamide Injection What is this medication? CYCLOPHOSPHAMIDE (sye kloe FOSS fa mide) treats some types of cancer. It works by slowing down the growth of cancer cells. This medicine may be used for other purposes; ask your health care provider or pharmacist if you have questions. COMMON BRAND NAME(S): Cyclophosphamide,  Cytoxan, Neosar What should I tell my care team before I take this medication? They need to know if you have any of these conditions: Heart disease Irregular heartbeat or rhythm Infection Kidney problems Liver disease Low blood cell levels (white cells, platelets, or red blood cells) Lung  disease Previous radiation Trouble passing urine An unusual or allergic reaction to cyclophosphamide, other medications, foods, dyes, or preservatives Pregnant or trying to get pregnant Breast-feeding How should I use this medication? This medication is injected into a vein. It is given by your care team in a hospital or clinic setting. Talk to your care team about the use of this medication in children. Special care may be needed. Overdosage: If you think you have taken too much of this medicine contact a poison control center or emergency room at once. NOTE: This medicine is only for you. Do not share this medicine with others. What if I miss a dose? Keep appointments for follow-up doses. It is important not to miss your dose. Call your care team if you are unable to keep an appointment. What may interact with this medication? Amphotericin B Amiodarone Azathioprine Certain antivirals for HIV or hepatitis Certain medications for blood pressure, such as enalapril, lisinopril, quinapril Cyclosporine Diuretics Etanercept Indomethacin Medications that relax muscles Metronidazole Natalizumab Tamoxifen Warfarin This list may not describe all possible interactions. Give your health care provider a list of all the medicines, herbs, non-prescription drugs, or dietary supplements you use. Also tell them if you smoke, drink alcohol, or use illegal drugs. Some items may interact with your medicine. What should I watch for while using this medication? This medication may make you feel generally unwell. This is not uncommon as chemotherapy can affect healthy cells as well as cancer cells. Report any side effects. Continue your course of treatment even though you feel ill unless your care team tells you to stop. You may need blood work while you are taking this medication. This medication may increase your risk of getting an infection. Call your care team for advice if you get a fever, chills, sore  throat, or other symptoms of a cold or flu. Do not treat yourself. Try to avoid being around people who are sick. Avoid taking medications that contain aspirin, acetaminophen, ibuprofen, naproxen, or ketoprofen unless instructed by your care team. These medications may hide a fever. Be careful brushing or flossing your teeth or using a toothpick because you may get an infection or bleed more easily. If you have any dental work done, tell your dentist you are receiving this medication. Drink water or other fluids as directed. Urinate often, even at night. Some products may contain alcohol. Ask your care team if this medication contains alcohol. Be sure to tell all care teams you are taking this medicine. Certain medicines, like metronidazole and disulfiram, can cause an unpleasant reaction when taken with alcohol. The reaction includes flushing, headache, nausea, vomiting, sweating, and increased thirst. The reaction can last from 30 minutes to several hours. Talk to your care team if you wish to become pregnant or think you might be pregnant. This medication can cause serious birth defects if taken during pregnancy and for 1 year after the last dose. A negative pregnancy test is required before starting this medication. A reliable form of contraception is recommended while taking this medication and for 1 year after the last dose. Talk to your care team about reliable forms of contraception. Do not father a child while taking this medication and for  4 months after the last dose. Use a condom during this time period. Do not breast-feed while taking this medication or for 1 week after the last dose. This medication may cause infertility. Talk to your care team if you are concerned about your fertility. Talk to your care team about your risk of cancer. You may be more at risk for certain types of cancer if you take this medication. What side effects may I notice from receiving this medication? Side effects  that you should report to your care team as soon as possible: Allergic reactions--skin rash, itching, hives, swelling of the face, lips, tongue, or throat Dry cough, shortness of breath or trouble breathing Heart failure--shortness of breath, swelling of the ankles, feet, or hands, sudden weight gain, unusual weakness or fatigue Heart muscle inflammation--unusual weakness or fatigue, shortness of breath, chest pain, fast or irregular heartbeat, dizziness, swelling of the ankles, feet, or hands Heart rhythm changes--fast or irregular heartbeat, dizziness, feeling faint or lightheaded, chest pain, trouble breathing Infection--fever, chills, cough, sore throat, wounds that don't heal, pain or trouble when passing urine, general feeling of discomfort or being unwell Kidney injury--decrease in the amount of urine, swelling of the ankles, hands, or feet Liver injury--right upper belly pain, loss of appetite, nausea, light-colored stool, dark yellow or brown urine, yellowing skin or eyes, unusual weakness or fatigue Low red blood cell level--unusual weakness or fatigue, dizziness, headache, trouble breathing Low sodium level--muscle weakness, fatigue, dizziness, headache, confusion Red or dark brown urine Unusual bruising or bleeding Side effects that usually do not require medical attention (report to your care team if they continue or are bothersome): Hair loss Irregular menstrual cycles or spotting Loss of appetite Nausea Pain, redness, or swelling with sores inside the mouth or throat Vomiting This list may not describe all possible side effects. Call your doctor for medical advice about side effects. You may report side effects to FDA at 1-800-FDA-1088. Where should I keep my medication? This medication is given in a hospital or clinic. It will not be stored at home. NOTE: This sheet is a summary. It may not cover all possible information. If you have questions about this medicine, talk to your  doctor, pharmacist, or health care provider.  2023 Elsevier/Gold Standard (2021-10-11 00:00:00) Bortezomib Injection What is this medication? BORTEZOMIB (bor TEZ oh mib) treats lymphoma. It may also be used to treat multiple myeloma, a type of bone marrow cancer. It works by blocking a protein that causes cancer cells to grow and multiply. This helps to slow or stop the spread of cancer cells. This medicine may be used for other purposes; ask your health care provider or pharmacist if you have questions. COMMON BRAND NAME(S): Velcade What should I tell my care team before I take this medication? They need to know if you have any of these conditions: Dehydration Diabetes Heart disease Liver disease Tingling of the fingers or toes or other nerve disorder An unusual or allergic reaction to bortezomib, other medications, foods, dyes, or preservatives If you or your partner are pregnant or trying to get pregnant Breastfeeding How should I use this medication? This medication is injected into a vein or under the skin. It is given by your care team in a hospital or clinic setting. Talk to your care team about the use of this medication in children. Special care may be needed. Overdosage: If you think you have taken too much of this medicine contact a poison control center or emergency room at  once. NOTE: This medicine is only for you. Do not share this medicine with others. What if I miss a dose? Keep appointments for follow-up doses. It is important not to miss your dose. Call your care team if you are unable to keep an appointment. What may interact with this medication? Ketoconazole Rifampin This list may not describe all possible interactions. Give your health care provider a list of all the medicines, herbs, non-prescription drugs, or dietary supplements you use. Also tell them if you smoke, drink alcohol, or use illegal drugs. Some items may interact with your medicine. What should I  watch for while using this medication? Your condition will be monitored carefully while you are receiving this medication. You may need blood work while taking this medication. This medication may affect your coordination, reaction time, or judgment. Do not drive or operate machinery until you know how this medication affects you. Sit up or stand slowly to reduce the risk of dizzy or fainting spells. Drinking alcohol with this medication can increase the risk of these side effects. This medication may increase your risk of getting an infection. Call your care team for advice if you get a fever, chills, sore throat, or other symptoms of a cold or flu. Do not treat yourself. Try to avoid being around people who are sick. Check with your care team if you have severe diarrhea, nausea, and vomiting, or if you sweat a lot. The loss of too much body fluid may make it dangerous for you to take this medication. Talk to your care team if you may be pregnant. Serious birth defects can occur if you take this medication during pregnancy and for 7 months after the last dose. You will need a negative pregnancy test before starting this medication. Contraception is recommended while taking this medication and for 7 months after the last dose. Your care team can help you find the option that works for you. If your partner can get pregnant, use a condom during sex while taking this medication and for 4 months after the last dose. Do not breastfeed while taking this medication and for 2 months after the last dose. This medication may cause infertility. Talk to your care team if you are concerned about your fertility. What side effects may I notice from receiving this medication? Side effects that you should report to your care team as soon as possible: Allergic reactions--skin rash, itching, hives, swelling of the face, lips, tongue, or throat Bleeding--bloody or black, tar-like stools, vomiting blood or brown material  that looks like coffee grounds, red or dark brown urine, small red or purple spots on skin, unusual bruising or bleeding Bleeding in the brain--severe headache, stiff neck, confusion, dizziness, change in vision, numbness or weakness of the face, arm, or leg, trouble speaking, trouble walking, vomiting Bowel blockage--stomach cramping, unable to have a bowel movement or pass gas, loss of appetite, vomiting Heart failure--shortness of breath, swelling of the ankles, feet, or hands, sudden weight gain, unusual weakness or fatigue Infection--fever, chills, cough, sore throat, wounds that don't heal, pain or trouble when passing urine, general feeling of discomfort or being unwell Liver injury--right upper belly pain, loss of appetite, nausea, light-colored stool, dark yellow or brown urine, yellowing skin or eyes, unusual weakness or fatigue Low blood pressure--dizziness, feeling faint or lightheaded, blurry vision Lung injury--shortness of breath or trouble breathing, cough, spitting up blood, chest pain, fever Pain, tingling, or numbness in the hands or feet Severe or prolonged diarrhea Stomach pain,  bloody diarrhea, pale skin, unusual weakness or fatigue, decrease in the amount of urine, which may be signs of hemolytic uremic syndrome Sudden and severe headache, confusion, change in vision, seizures, which may be signs of posterior reversible encephalopathy syndrome (PRES) TTP--purple spots on the skin or inside the mouth, pale skin, yellowing skin or eyes, unusual weakness or fatigue, fever, fast or irregular heartbeat, confusion, change in vision, trouble speaking, trouble walking Tumor lysis syndrome (TLS)--nausea, vomiting, diarrhea, decrease in the amount of urine, dark urine, unusual weakness or fatigue, confusion, muscle pain or cramps, fast or irregular heartbeat, joint pain Side effects that usually do not require medical attention (report to your care team if they continue or are  bothersome): Constipation Diarrhea Fatigue Loss of appetite Nausea This list may not describe all possible side effects. Call your doctor for medical advice about side effects. You may report side effects to FDA at 1-800-FDA-1088. Where should I keep my medication? This medication is given in a hospital or clinic. It will not be stored at home. NOTE: This sheet is a summary. It may not cover all possible information. If you have questions about this medicine, talk to your doctor, pharmacist, or health care provider.  2023 Elsevier/Gold Standard (2022-01-18 00:00:00)

## 2022-06-30 ENCOUNTER — Telehealth: Payer: Self-pay

## 2022-06-30 ENCOUNTER — Other Ambulatory Visit: Payer: Self-pay | Admitting: Hematology and Oncology

## 2022-06-30 DIAGNOSIS — C9000 Multiple myeloma not having achieved remission: Secondary | ICD-10-CM

## 2022-06-30 MED ORDER — SCOPOLAMINE 1 MG/3DAYS TD PT72: 1.0000 | MEDICATED_PATCH | TRANSDERMAL | 12 refills | Status: DC

## 2022-06-30 NOTE — Telephone Encounter (Signed)
@   1423 - I called to notify pt of new prescription of scopolamine. She states she has used the scopolamine patches and it didn't work. She will alternate the phenergan q 6hr PRN and Zofran '8mg'$  q 8 hr PRN. I encouraged her to eat small frequent snacks, rather than meals. She plans to try some broth. I told her just eating a cracker or 2 will help to keep stomach from being completely empty. I told her Dr Federico Flake asked that she not hesitate to call him over the weekend if needed. She verbalized understanding.   @ [place], sent in the prescription for scopolamine patch per Dr Jeralyn Bennett recommendation. Pt already has Zofran in the home.   @ 2094 - Pt has called c/o lingering nausea, even with use of phenergan. If she gets up to go to bathroom she gets really nauseated. She doesn't have any compazine or ativan?!?! Denies vomiting. Please advise. Dr Federico Flake not here today.

## 2022-06-30 NOTE — Progress Notes (Deleted)
[place] [facility] Follow up  Visit:  Patient Care Team: Earlyne Iba, NP as PCP - General (Nurse Practitioner)  CHIEF COMPLAINTS/PURPOSE OF CONSULTATION:  Oncology History  Multiple myeloma ([place])  06/20/2022 Initial Diagnosis   Multiple myeloma ([place])   06/29/2022 -  Chemotherapy   Patient is on Treatment Plan : MULTIPLE MYELOMA CyBorD - Weekly Bortezomib       HISTORY OF PRESENTING ILLNESS: Kimberly Esparza 56 y.o. female is here because of  multiple myeloma Medical history notable for DHD, GERD, hypothyroidism, osteoarthritis, fibromyalgia, stomach polyps  June 14, 2022 through June 19 2022: Admitted to [facility] following several weeks of feeling poorly.  She has been to urgent care on several occasions during the weeks prior with complaints of facial pain and headache.  On each occasion she was given antibiotics and on 1 occasion Medrol Dosepak was added.  Patient was able to see PCP this week who drew labs which were noted to be abnormal she was subsequently directed to the emergency room. CT head demonstrated multiple lytic lesions throughout the bony calvarium suggesting neoplastic process.  Largest of these was in the left frontal calvarium.  There is a soft tissue mass extending from this large lytic lesion measuring 3.8 x 3.3 cm in size coarse calcifications extending into the scalp and extra-axial location left frontal region.  INR 1.2 PTT 26.3 WBC 9.1 hemoglobin 6.3 MCV 98 platelet count 73; 54 segs 4 bands 35 lymphs 3 monos 2 eos 2S.  Chemistries notable for sodium 132 creatinine 3.30 glucose 122 calcium 13.3 corrected to 14.4 ionized calcium 2.35 albumin 2.9 total protein 12.2 AST 63 ALT 73 Serum free kappa 3067 lambda 2.3 with a kappa lambda 1333.  Blood viscosity 5.0  24 hr urine showed loss of 1582 mg protein.  UPEP  showed loss of 1023 mg of paraprotein    Hypercalcemia was treated with dexamethasone, IV hydration, symptomatic and calcitonin   June 15 2022: CT chest abdomen pelvis contrast:  Diffuse moth-eaten appearance of the skeleton with bilateral  pathologic rib fractures of various ages. Expansile 2 cm inferior left scapular lytic lesion. Small 2.0 cm soft tissue lesion in the right paraspinal region at the level of the fourth costovertebral  junction. Findings are compatible with suspected multiple myeloma.  Small dependent left pleural effusion Trace pericardial effusion.  No lymphadenopathy.   June 16 2022: Port-A-Cath placed by radiology  June 17, 2022: Cardiac echo.  LVEF 60 to 65%.  Right atrium moderately dilated.  Moderate tricuspid valve regurg.  IVC dilated.  Pulmonary artery systolic pressure moderately elevated at 54 mmHg  June 19 2022: Right posterior iliac crest bone marrow biopsy and aspirate Extensive involvement by atypical plasma cells  representing 70% of all cells in the aspirate associated with variably sized aggregates and diffuse sheets in the clot and biopsy sections.  The plasma cells display kappa light chain restriction consistent with plasma cell neoplasm  June 22 2022:  Post hospital follow up for management of multiple myeloma.    Reviewed results of labs and bone marrow biopsy with patient and family.    Review of Systems  Constitutional:  Positive for fatigue and unexpected weight change. Negative for chills and fever.       Anorectic.  Has lost weight over past few weeks owing to anorexia  HENT:   Negative for hearing loss, lump/mass, mouth sores, nosebleeds, sore throat, tinnitus, trouble swallowing and voice change.   Eyes:  Negative for  eye problems and icterus.       Vision changes:  None  Respiratory:  Positive for chest tightness. Negative for cough, hemoptysis, shortness of breath and wheezing.        PND:  none Orthopnea:  none Has chest wall pain owing to myeloma   Cardiovascular:  Negative for chest pain, leg swelling and palpitations.       PND:  none Orthopnea:   none  Gastrointestinal:  Positive for constipation. Negative for abdominal distention, abdominal pain, blood in stool, diarrhea, nausea and vomiting.       Constipation improved with treatment of hypecalcemia  Endocrine: Negative for hot flashes.       Cold intolerance:  none Heat intolerance:  none  Genitourinary:  Negative for bladder incontinence, difficulty urinating, dysuria, frequency, hematuria and nocturia.   Musculoskeletal:  Positive for arthralgias and myalgias. Negative for back pain, gait problem, neck pain and neck stiffness.       Arthralgias in shoulders, ribs, back which makes sleeping difficult.  Uses Oxycodone infrequently since it does not provide much relief  Skin:  Negative for itching, rash and wound.  Neurological:  Positive for extremity weakness and headaches. Negative for dizziness, gait problem, light-headedness, numbness, seizures and speech difficulty.  Hematological:  Negative for adenopathy. Does not bruise/bleed easily.  Psychiatric/Behavioral:  Positive for confusion and sleep disturbance. Negative for suicidal ideas. The patient is not nervous/anxious.        Thinking foggy despite improvement in hypercalcemia    MEDICAL HISTORY: Past Medical History:  Diagnosis Date   ADHD (attention deficit hyperactivity disorder)    Anxiety    Depression    GERD (gastroesophageal reflux disease)    Hypothyroidism    Multiple allergies    Osteoarthritis     SURGICAL HISTORY: Past Surgical History:  Procedure Laterality Date   ADENOIDECTOMY     BONE MARROW BIOPSY     KNEE SURGERY Bilateral    PARTIAL HYSTERECTOMY     SINOSCOPY     TONSILLECTOMY     TUBAL LIGATION     WRIST SURGERY Bilateral     SOCIAL HISTORY: Social History   Socioeconomic History   Marital status: Married    Spouse name: Carloyn Manner   Number of children: 2   Years of education: 14   Highest education level: Associate degree: academic program  Occupational History   Not on file  Tobacco  Use   Smoking status: Never   Smokeless tobacco: Never  Vaping Use   Vaping Use: Never used  Substance and Sexual Activity   Alcohol use: Yes    Comment: rarely   Drug use: Never   Sexual activity: Not Currently    Partners: Male    Comment: Married but no interest  Other Topics Concern   Not on file  Social History Narrative   Not on file   Social Determinants of Health   Financial Resource Strain: Not on file  Food Insecurity: Not on file  Transportation Needs: Not on file  Physical Activity: Not on file  Stress: Not on file  Social Connections: Not on file  Intimate Partner Violence: Not on file    FAMILY HISTORY Family History  Problem Relation Age of Onset   Hypertension Mother    Diabetes Mother    Glaucoma Mother    Gout Mother    Congestive Heart Failure Mother    Allergic rhinitis Father    Thyroid disease Father    Allergic rhinitis Brother  Gout Brother     ALLERGIES:  is allergic to doxycycline.  MEDICATIONS:  Current Outpatient Medications  Medication Sig Dispense Refill   acyclovir (ZOVIRAX) 400 MG tablet Take 1 tablet (400 mg total) by mouth 2 (two) times daily. 60 tablet 3   amoxicillin (AMOXIL) 500 MG tablet Take 1 tablet (500 mg total) by mouth as directed. Take 4 tabs prior to dental procedure 4 tablet 0   azelastine (ASTELIN) 0.1 % nasal spray Place 2 sprays into both nostrils 2 (two) times daily as needed for rhinitis. Use in each nostril as directed 30 mL 3   cetirizine (ZYRTEC) 10 MG tablet Take 10 mg by mouth daily.     clonazePAM (KLONOPIN) 0.5 MG tablet Take 1 tablet (0.5 mg total) by mouth 2 (two) times daily as needed for anxiety. 60 tablet 1   dexamethasone (DECADRON) 4 MG tablet Take 4 mg by mouth daily.     dexamethasone (DECADRON) 4 MG tablet Take 8 mg po daily for 2 days after Cytoxan chemotherapy. 30 tablet 3   DEXILANT 60 MG capsule Take 1 capsule by mouth daily.  0   fluticasone (FLONASE) 50 MCG/ACT nasal spray Place 1 spray  into the nose daily.     gabapentin (NEURONTIN) 400 MG capsule Take 400 mg by mouth at bedtime.     levocetirizine (XYZAL) 5 MG tablet TAKE ONE TABLET BY MOUTH TWICE DAILY AS DIRECTED 60 tablet 0   levothyroxine (SYNTHROID) 88 MCG tablet Take 88 mcg by mouth daily.  3   ondansetron (ZOFRAN-ODT) 4 MG disintegrating tablet Take 4 mg by mouth every 8 (eight) hours as needed.     promethazine (PHENERGAN) 25 MG tablet Take 25 mg by mouth 2 (two) times daily as needed.     scopolamine (TRANSDERM-SCOP) 1 MG/3DAYS Place 1 patch (1.5 mg total) onto the skin every 3 (three) days. 10 patch 12   No current facility-administered medications for this visit.    PHYSICAL EXAMINATION:  ECOG PERFORMANCE STATUS: 2 - Symptomatic, <50% confined to bed   There were no vitals filed for this visit.  There were no vitals filed for this visit.   Physical Exam Vitals and nursing note reviewed.  Constitutional:      General: She is not in acute distress.    Appearance: Normal appearance. She is obese. She is not toxic-appearing or diaphoretic.     Comments: Here with mother and daughter.  Less ill appearing than during hospitalization.  Uncomfortable.    HENT:     Head: Normocephalic and atraumatic.     Right Ear: External ear normal.     Left Ear: External ear normal.     Nose: Nose normal. No congestion or rhinorrhea.     Mouth/Throat:     Mouth: Mucous membranes are moist.  Eyes:     General: No scleral icterus.    Extraocular Movements: Extraocular movements intact.     Conjunctiva/sclera: Conjunctivae normal.     Pupils: Pupils are equal, round, and reactive to light.  Cardiovascular:     Rate and Rhythm: Normal rate and regular rhythm.     Heart sounds: Normal heart sounds. No murmur heard.    No friction rub. No gallop.  Pulmonary:     Effort: Pulmonary effort is normal. No respiratory distress.     Breath sounds: Normal breath sounds. No stridor. No wheezing or rales.  Chest:     Chest  wall: Tenderness present.  Abdominal:     General:  Bowel sounds are normal. There is no distension.     Palpations: Abdomen is soft.     Tenderness: There is no abdominal tenderness. There is no guarding or rebound.  Musculoskeletal:        General: No swelling, tenderness or deformity.     Cervical back: Normal range of motion and neck supple. No rigidity or tenderness.     Right lower leg: Edema present.     Left lower leg: Edema present.  Lymphadenopathy:     Head:     Right side of head: No submental, submandibular, tonsillar, preauricular, posterior auricular or occipital adenopathy.     Left side of head: No submental, submandibular, tonsillar, preauricular, posterior auricular or occipital adenopathy.     Cervical: No cervical adenopathy.     Right cervical: No superficial, deep or posterior cervical adenopathy.    Left cervical: No superficial, deep or posterior cervical adenopathy.     Upper Body:     Right upper body: No supraclavicular, axillary, pectoral or epitrochlear adenopathy.     Left upper body: No supraclavicular, axillary, pectoral or epitrochlear adenopathy.  Skin:    General: Skin is warm.     Coloration: Skin is not jaundiced.     Findings: No bruising, erythema or rash.  Neurological:     General: No focal deficit present.     Mental Status: She is alert and oriented to person, place, and time. Mental status is at baseline.     Cranial Nerves: No cranial nerve deficit.     Sensory: No sensory deficit.  Psychiatric:        Mood and Affect: Mood normal.        Behavior: Behavior normal.        Thought Content: Thought content normal.        Judgment: Judgment normal.      LABORATORY DATA: I have personally reviewed the data as listed:  Appointment on 06/22/2022  Component Date Value Ref Range Status   Sodium 06/22/2022 129 (L)  135 - 145 mmol/L Final   Potassium 06/22/2022 3.1 (L)  3.5 - 5.1 mmol/L Final   Chloride 06/22/2022 93 (L)  98 - 111 mmol/L  Final   CO2 06/22/2022 23  22 - 32 mmol/L Final   Glucose, Bld 06/22/2022 206 (H)  70 - 99 mg/dL Final   Glucose reference range applies only to samples taken after fasting for at least 8 hours.   BUN 06/22/2022 36 (H)  6 - 20 mg/dL Final   Creatinine 06/22/2022 2.35 (H)  0.44 - 1.00 mg/dL Final   Calcium 06/22/2022 8.7 (L)  8.9 - 10.3 mg/dL Final   Total Protein 06/22/2022 >12.0 (H)  6.5 - 8.1 g/dL Final   RESULTS VERIFIED BY REPEAT TESTING   Albumin 06/22/2022 1.8 (L)  3.5 - 5.0 g/dL Final   AST 06/22/2022 35  15 - 41 U/L Final   ALT 06/22/2022 26  0 - 44 U/L Final   Alkaline Phosphatase 06/22/2022 70  38 - 126 U/L Final   Total Bilirubin 06/22/2022 0.6  0.3 - 1.2 mg/dL Final   GFR, Estimated 06/22/2022 24 (L)  >60 mL/min Final   Comment: (NOTE) Calculated using the CKD-EPI Creatinine Equation (2021)    Anion gap 06/22/2022 13  5 - 15 Final   Performed at [facility], [place] [address] [place], [place] [address]   WBC Count 06/22/2022 8.4  4.0 - 10.5 K/uL Final   RBC 06/22/2022 3.87  3.87 - 5.11 MIL/uL Final   Hemoglobin 06/22/2022 11.0 (L)  12.0 - 15.0 g/dL Final   HCT 06/22/2022 34.0 (L)  36.0 - 46.0 % Final   MCV 06/22/2022 87.9  80.0 - 100.0 fL Final   MCH 06/22/2022 28.4  26.0 - 34.0 pg Final   MCHC 06/22/2022 32.4  30.0 - 36.0 g/dL Final   RDW 06/22/2022 19.3 (H)  11.5 - 15.5 % Final   Platelet Count 06/22/2022 73 (L)  150 - 400 K/uL Final   Comment: SPECIMEN CHECKED FOR CLOTS Immature Platelet Fraction may be clinically indicated, consider ordering this additional test [id number] REPEATED TO VERIFY PLATELET COUNT CONFIRMED BY SMEAR    nRBC 06/22/2022 0.8 (H)  0.0 - 0.2 % Final   Neutrophils Relative % 06/22/2022 51  % Final   Neutro Abs 06/22/2022 4.3  1.7 - 7.7 K/uL Final   Lymphocytes Relative 06/22/2022 28  % Final   Lymphs Abs 06/22/2022 2.4  0.7 - 4.0 K/uL Final   Monocytes Relative 06/22/2022 4  % Final   Monocytes Absolute 06/22/2022 0.4  0.1  - 1.0 K/uL Final   Eosinophils Relative 06/22/2022 1  % Final   Eosinophils Absolute 06/22/2022 0.1  0.0 - 0.5 K/uL Final   Basophils Relative 06/22/2022 1  % Final   Basophils Absolute 06/22/2022 0.1  0.0 - 0.1 K/uL Final   WBC Morphology 06/22/2022 MILD LEFT SHIFT (1-5% METAS, OCC MYELO, OCC BANDS)   Final   Immature Granulocytes 06/22/2022 15  % Final   Abs Immature Granulocytes 06/22/2022 1.22 (H)  0.00 - 0.07 K/uL Final   Rouleaux 06/22/2022 PRESENT   Final   Performed at [facility], [place] [address], [place], [place] [address]  Office Visit on 06/22/2022  Component Date Value Ref Range Status   Beta-2 Microglobulin 06/22/2022 17.7 (H)  0.6 - 2.4 mg/L Final   Comment: (NOTE) Siemens Immulite 2000 Immunochemiluminometric assay (ICMA) Values obtained with different assay methods or kits cannot be used interchangeably. Results cannot be interpreted as absolute evidence of the presence or absence of malignant disease. Performed At: [facility] [place], [place] [id number] Rush Farmer MD JJ:[id number]   Hospital Outpatient Visit on 06/19/2022  Component Date Value Ref Range Status   SURGICAL PATHOLOGY 06/19/2022    Final-Edited                   Value:Surgical Pathology CASE: [id number] PATIENT: Sobia Scallon Bone Marrow Report     Clinical History: Multiple Myeloma not having achieved remission  ([place])     DIAGNOSIS:  BONE MARROW, ASPIRATE, CLOT, CORE: -Hypercellular bone marrow with plasma cell neoplasm -See comment  PERIPHERAL BLOOD: -Normocytic-normochromic anemia -Neutrophilic left shift -Thrombocytopenia  COMMENT:  The bone marrow shows extensive involvement by atypical plasma cells representing 70% of all cells in the aspirate associated with variably sized aggregates and diffuse sheets in the clot and biopsy sections. The plasma cells display kappa light chain restriction consistent with plasma cell neoplasm.   Correlation with cytogenetic and FISH studies is recommended.  MICROSCOPIC DESCRIPTION:  PERIPHERAL BLOOD SMEAR: The red blood cells display mild anisopoikilocytosis with mild polychromasia and prominent rouleaux formation.  The white blood cells are normal in number with scattered                          neutrophils displaying mild toxic granulation.  This is associated with neutrophilic left shift.  The platelets  are decreased in number.  BONE MARROW ASPIRATE: Bone marrow particles present with prominent degenerative cellular changes. Erythroid precursors: Relatively decreased but with progressive maturation.  Scattered late precursors display nuclear cytoplasmic dyssynchrony or irregular nuclei. Granulocytic precursors: Relatively decreased but with progressive maturation. Megakaryocytes: Appear decreased with scattered forms present Lymphocytes/plasma cells: The plasma cells are prominently increased in number representing 70% of all cells associated with atypical cytomorphologic features characterized by cytomegaly and/or small nucleoli.  Large lymphoid aggregates are not seen.  TOUCH PREPARATIONS: A mixture of cell types but with relative abundance of atypical plasma cells  CLOT AND BIOPSY: The sections show 80 to 100% cellularity with extensive replacement o                         f the medullary space by diffuse sheets and variably sized aggregates of atypical plasma cells characterized by partially clumped to vesicular chormaltin and small nucleoli.  Myeloid hematopoiesis is decreased with small residual foci present.  Immunohistochemical stain for CD138 and in situ hybridization for kappa and lambda were performed on blocks B1 and C1 with appropriate controls.  CD138 highlights the extensive plasma cell component in the marrow which displays kappa light chain restriction.  IRON STAIN: Iron stains are performed on a bone marrow aspirate or touch imprint smear and  section of clot. The controls stained appropriately.       Storage Iron: Present      Ring Sideroblasts: Absent  ADDITIONAL DATA/TESTING: The bone marrow specimen was sent for cytogenetic and analysis and FISH for multiple myeloma and a separate report will follow.  CELL COUNT DATA:  Bone Marrow count performed on 500 cells shows: Blasts:   0%   Myeloid:  22% Promyelocytes: 0%                            Erythroid:     5% Myelocytes:    1%   Lymphocytes:   2% Metamyelocytes:     0%   Plasma cells:  70% Bands:    1% Neutrophils:   18%  M:E ratio:     4.4 Eosinophils:   2% Basophils:     0% Monocytes:     1%  Lab Data: CBC performed on 06/20/2022 shows: WBC: 6.2 k/uL  Neutrophils:   61%  Myelocytes:           1% Hgb: 7.0 g/dL  Lymphocytes:   30%  Bands:                   2% HCT: 19.4 %    Monocytes:     6% MCV: 87 fL     Eosinophils:   0% RDW: 17.1 %    Basophils:     0% PLT: 57 k/uL    GROSS DESCRIPTION:  A: Aspirate smear  B: Received in B-plus fixative are tissue fragments measuring 0.5 x 0.2 x 0.1 cm in aggregate.  The specimen is submitted in toto.  C: Received in B-plus fixative is a 0.9 x 0.2 cm core of bone.  The specimen is submitted in toto following decalcification.  [facility] 06/19/2022)   Final Diagnosis performed by Susanne Greenhouse, MD.   Electronically signed 06/21/2022 Technical and / or Professional components performed at [facility], [place] [address] [place], [place] [address].  Immunohistochemistry Technical component (if applicable) was performed at [facility]. [address] [place], [place], [place] [address].   IMMUNOHISTOCHEMISTRY DISCLAIMER (if applicable): Some of these immunohistochemical stains may have been developed and the performance characteristics determine by [facility]. Some may not have been cleared or approved by the U.S. Food and Drug Administration. The  FDA has determined that such clearance or approval is not necessary. This test is used for clinical purposes. It should not be regarded as investigational or for research. This laboratory is certified under the [place] (CLIA-88) as qualified to perform high complexity clinical laboratory testing.  The controls stained appropriately.     RADIOGRAPHIC STUDIES: I have personally reviewed the radiological images as listed and agree with the findings in the report  No results found.  ASSESSMENT/PLAN 56 year old female who on June 14 2022 presented with acute renal failure, hypercalcemia, elevated total protein in setting of hypoalbuminemia, cytopenias (anemia, thrombocytopenia), lytic bone lesions which has lead to the diagnosis of multiple myeloma.    Meets diagnostic criteria for myeloma by virtue of the following  Bone marrow bx with > 60% plasma cells (these show kappa light chain restriction)   Hyper calcemia  Bone lesions  Cytopenias (anemia, thrombocytopenia)  Renal failure  Significantly elevated serum kappa light chains  Elevated total protein in setting of low albumin.     Awaiting formal results  of SPEP with IEP, quantitative immunoglobulins.  Have ordered UPEP with IEP on spot urine Have also ordered repeat CBC with diff, CMP, and beta 2 microglobulin.  Anticipate that patient will have Stage III disease by ISS  Hypercalcemia:  Secondary to myeloma.  Was treated with IVF, calcitonin, Dexamethasone, Zometa.  Remains on Dexamethasone 4 mg  daily for maintenance until chemotherapy for myeloma starts.    Pulmonary HTN:  Has been reported in Multiple Myeloma.  Multifactorial can be related to caused by pulmonary vasculopathy, endothelial dysfunction, pulmonary vascular amyloidosis, hyperviscosity.  Noted to have hyperviscosity on labs drawn during the hospitalization.  Not hypoxemic so doubt PE  Cancer related pain:  Due to bone  lesions from myeloma.  Will improve with treatment.      Poor venous access:  Underwent placement of port-a-cath by Radiology during the hospitalization in October 2023  Therapeutics:  Patient appears to have high risk disease.  She is a candidate for consideration of autologous stem cell transplant and will be treated as such.  Given the extent of her renal insufficiency do not favor use of Revlimid.  I have written for CyBorD and Zometa.  She will need chemotherapy teaching.  I have discussed risks and benefits of treatment with patient.  Anticipate that she will begin treatment next week.       Cancer Staging  No matching staging information was found for the patient.   No problem-specific Assessment & Plan notes found for this encounter.   No orders of the defined types were placed in this encounter.   All questions were answered. The patient knows to call the clinic with any problems, questions or concerns.  This note was electronically signed.    Barbee Cough, MD  06/30/2022 2:29 PM

## 2022-07-02 DIAGNOSIS — C9000 Multiple myeloma not having achieved remission: Secondary | ICD-10-CM | POA: Insufficient documentation

## 2022-07-02 DIAGNOSIS — J942 Hemothorax: Secondary | ICD-10-CM | POA: Insufficient documentation

## 2022-07-03 ENCOUNTER — Encounter: Payer: Self-pay | Admitting: Oncology

## 2022-07-04 ENCOUNTER — Inpatient Hospital Stay: Payer: 59

## 2022-07-04 ENCOUNTER — Ambulatory Visit: Payer: 59 | Admitting: Oncology

## 2022-07-04 ENCOUNTER — Ambulatory Visit: Payer: 59

## 2022-07-04 ENCOUNTER — Encounter: Payer: Self-pay | Admitting: Oncology

## 2022-07-04 ENCOUNTER — Telehealth: Payer: Self-pay

## 2022-07-04 NOTE — Telephone Encounter (Signed)
Left a voice mail with Irean Hong, patient's case manager at [facility] to make sure patient gets a follow up appointment scheduled with the office when she is discharged for her hospital stay.

## 2022-07-06 ENCOUNTER — Ambulatory Visit: Payer: 59

## 2022-07-06 ENCOUNTER — Other Ambulatory Visit: Payer: 59

## 2022-07-06 ENCOUNTER — Ambulatory Visit: Payer: 59 | Admitting: Oncology

## 2022-07-06 NOTE — Progress Notes (Unsigned)
[facility] up  Visit:  Patient Care Team: Earlyne Iba, NP as PCP - General (Nurse Practitioner) Barbee Cough, MD as Consulting Physician (Internal Medicine)  CHIEF COMPLAINTS/PURPOSE OF CONSULTATION:  Oncology History  Multiple myeloma ([place])  06/20/2022 Initial Diagnosis   Multiple myeloma ([place])   06/29/2022 -  Chemotherapy   Patient is on Treatment Plan : MULTIPLE MYELOMA CyBorD - Weekly Bortezomib       HISTORY OF PRESENTING ILLNESS: Kimberly Esparza 56 y.o. female is here because of  multiple myeloma Medical history notable for DHD, GERD, hypothyroidism, osteoarthritis, fibromyalgia, stomach polyps  June 14, 2022 through June 19 2022: Admitted to [facility] following several weeks of feeling poorly.  She has been to urgent care on several occasions during the weeks prior with complaints of facial pain and headache.  On each occasion she was given antibiotics and on 1 occasion Medrol Dosepak was added.  Patient was able to see PCP this week who drew labs which were noted to be abnormal she was subsequently directed to the emergency room. CT head demonstrated multiple lytic lesions throughout the bony calvarium suggesting neoplastic process.  Largest of these was in the left frontal calvarium.  There is a soft tissue mass extending from this large lytic lesion measuring 3.8 x 3.3 cm in size coarse calcifications extending into the scalp and extra-axial location left frontal region.  INR 1.2 PTT 26.3 WBC 9.1 hemoglobin 6.3 MCV 98 platelet count 73; 54 segs 4 bands 35 lymphs 3 monos 2 eos 2S.  Chemistries notable for sodium 132 creatinine 3.30 glucose 122 calcium 13.3 corrected to 14.4 ionized calcium 2.35 albumin 2.9 total protein 12.2 AST 63 ALT 73  Immunofixation showed a biclonal IgA kappa protein Serum free kappa 3067 lambda 2.3 with a kappa lambda 1333.  IgG 215 IgA >6400 IgM 13 Blood viscosity 5.0  24 hr urine showed loss of 1582  mg protein.  UPEP  showed loss of 1023 mg of paraprotein    Hypercalcemia was treated with dexamethasone, IV hydration, symptomatic and calcitonin  June 15 2022: CT chest abdomen pelvis contrast:  Diffuse moth-eaten appearance of the skeleton with bilateral  pathologic rib fractures of various ages. Expansile 2 cm inferior left scapular lytic lesion. Small 2.0 cm soft tissue lesion in the right paraspinal region at the level of the fourth costovertebral  junction. Findings are compatible with suspected multiple myeloma.  Small dependent left pleural effusion Trace pericardial effusion.  No lymphadenopathy.   June 16 2022: Port-A-Cath placed by radiology  June 17, 2022: Cardiac echo.  LVEF 60 to 65%.  Right atrium moderately dilated.  Moderate tricuspid valve regurg.  IVC dilated.  Pulmonary artery systolic pressure moderately elevated at 54 mmHg  June 19 2022: Right posterior iliac crest bone marrow biopsy and aspirate Extensive involvement by atypical plasma cells  representing 70% of all cells in the aspirate associated with variably sized aggregates and diffuse sheets in the clot and biopsy sections.  The plasma cells display kappa light chain restriction consistent with plasma cell neoplasm FISH analysis demonstrated a T p53 deletion as well as Cyclin D1/IgH fusion  June 22 2022:  Post hospital follow up for management of multiple myeloma.    Reviewed results of labs and bone marrow biopsy with patient and family.    June 28 2022:  Admitted to [facility] with bilateral pleural effusions, R >> L.  Therapeutic Right thoracocentesis yielded 2.2 liters of bloody pleural fluid  with relief of symptoms.  Cytology showed no malignant cells  June 29, 2022: Discharged home so as to begin chemotherapy.  Cycle 1 day 1 CyBorD July 01 2022:  Presented to [facility] with SOB.   CT CAP showed  Large right hemothorax.  Trace left-sided pleural effusion, decreased in  the interval.  New patchy ground-glass opacity anteriorly in the left upper lobe with nodular peripheral ground-glass opacity in the posterior left  upper lobe. Imaging features are nonspecific and may be  infectious/inflammatory Bibasilar collapse/consolidation, right greater than left and similar to 06/28/2022..  Numerous lucent lesions throughout the visualized bony anatomy  compatible with the reported clinical history of multiple myeloma.  Multiple bilateral rib fractures of varying age, as before. Superior endplate compression deformity at T9 and T12 is stable.  No acute findings in the abdomen or pelvis.  Right Chest tube placed with recovery of bloody pleural fluid.  Labs notable for Hgb 4.8.  Patient was transferred to [facility] for acute management  July 02 2022:  Admitted to [facility].  Placed in ICU.  Chest tube removed as bloody output declined.   Transfused with 3 units PRBCs CT Chest without contrast showed  Right-sided chest tube in place with tip terminating in the right upper lobe with surrounding pulmonary hemorrhage. Similar to prior, the side-port terminates outside of the thoracic cavity. This tube is not well positioned to drain the remaining right-sided pleural fluid.  Moderate sized right hydropneumothorax.    July 08 2022:  WBC 2.6 Hgb 8.2 PLT 69; 39 seg 51 lymph 7 mono 2 eos 1 baso.  Cr 2.37   July 11 2022:  Post hospital follow up.  Having considerable back pain.  Also has pain in  right chest which was site of chest tube.  Fatigued.  Not sleeping well due to pain.  Using narcotic analgesics sparingly because she is concerned about developing addiction.  Has oxycodone,  gabapentin, Trazadone for pain.  Asked her to provide a tally of how many oxycodone tablets she has.  She requested considering addition of a muscle relaxer.   Reviewed results of bone marrow, SPEP with IEP, free light chain and FISH results with patient and her family    Review of Systems  Constitutional:   Positive for fatigue and unexpected weight change. Negative for chills and fever.       Anorectic.  Has lost weight over past few weeks owing to anorexia  HENT:   Negative for hearing loss, lump/mass, mouth sores, nosebleeds, sore throat, tinnitus, trouble swallowing and voice change.   Eyes:  Negative for eye problems and icterus.       Vision changes:  None  Respiratory:  Positive for chest tightness. Negative for cough, hemoptysis, shortness of breath and wheezing.        PND:  none Orthopnea:  none Has chest wall pain owing to myeloma   Cardiovascular:  Negative for chest pain, leg swelling and palpitations.       PND:  none Orthopnea:  none  Gastrointestinal:  Positive for constipation. Negative for abdominal distention, abdominal pain, blood in stool, diarrhea, nausea and vomiting.       Constipation improved with treatment of hypecalcemia  Endocrine: Negative for hot flashes.       Cold intolerance:  none Heat intolerance:  none  Genitourinary:  Negative for bladder incontinence, difficulty urinating, dysuria, frequency, hematuria and nocturia.   Musculoskeletal:  Positive for arthralgias and myalgias. Negative for back pain, gait problem, neck  pain and neck stiffness.       Arthralgias in shoulders, ribs, back which makes sleeping difficult.  Uses Oxycodone infrequently since it does not provide much relief  Skin:  Negative for itching, rash and wound.  Neurological:  Positive for extremity weakness and headaches. Negative for dizziness, gait problem, light-headedness, numbness, seizures and speech difficulty.  Hematological:  Negative for adenopathy. Does not bruise/bleed easily.  Psychiatric/Behavioral:  Positive for confusion and sleep disturbance. Negative for suicidal ideas. The patient is not nervous/anxious.        Thinking foggy despite improvement in hypercalcemia    MEDICAL HISTORY: Past Medical History:  Diagnosis Date   ADHD (attention deficit hyperactivity disorder)     Anxiety    Depression    GERD (gastroesophageal reflux disease)    Hypothyroidism    Multiple allergies    Osteoarthritis     SURGICAL HISTORY: Past Surgical History:  Procedure Laterality Date   ADENOIDECTOMY     BONE MARROW BIOPSY     KNEE SURGERY Bilateral    PARTIAL HYSTERECTOMY     SINOSCOPY     TONSILLECTOMY     TUBAL LIGATION     WRIST SURGERY Bilateral     SOCIAL HISTORY: Social History   Socioeconomic History   Marital status: Married    Spouse name: Carloyn Manner   Number of children: 2   Years of education: 14   Highest education level: Associate degree: academic program  Occupational History   Not on file  Tobacco Use   Smoking status: Never   Smokeless tobacco: Never  Vaping Use   Vaping Use: Never used  Substance and Sexual Activity   Alcohol use: Yes    Comment: rarely   Drug use: Never   Sexual activity: Not Currently    Partners: Male    Comment: Married but no interest  Other Topics Concern   Not on file  Social History Narrative   Not on file   Social Determinants of Health   Financial Resource Strain: Not on file  Food Insecurity: Not on file  Transportation Needs: Not on file  Physical Activity: Not on file  Stress: Not on file  Social Connections: Not on file  Intimate Partner Violence: Not on file    FAMILY HISTORY Family History  Problem Relation Age of Onset   Hypertension Mother    Diabetes Mother    Glaucoma Mother    Gout Mother    Congestive Heart Failure Mother    Allergic rhinitis Father    Thyroid disease Father    Allergic rhinitis Brother    Gout Brother     ALLERGIES:  is allergic to doxycycline.  MEDICATIONS:  Current Outpatient Medications  Medication Sig Dispense Refill   acyclovir (ZOVIRAX) 400 MG tablet Take 1 tablet (400 mg total) by mouth 2 (two) times daily. 60 tablet 3   azelastine (ASTELIN) 0.1 % nasal spray Place 2 sprays into both nostrils 2 (two) times daily as needed for rhinitis. Use in each  nostril as directed 30 mL 3   cetirizine (ZYRTEC) 10 MG tablet Take 10 mg by mouth daily.     clonazePAM (KLONOPIN) 0.5 MG tablet Take 1 tablet (0.5 mg total) by mouth 2 (two) times daily as needed for anxiety. 60 tablet 1   dexamethasone (DECADRON) 4 MG tablet Take 4 mg by mouth daily.     dexamethasone (DECADRON) 4 MG tablet Take 8 mg po daily for 2 days after Cytoxan chemotherapy. 30 tablet 3  DEXILANT 60 MG capsule Take 1 capsule by mouth daily.  0   ELIQUIS 2.5 MG TABS tablet Take 2.5 mg by mouth 2 (two) times daily.     fluticasone (FLONASE) 50 MCG/ACT nasal spray Place 1 spray into the nose daily.     gabapentin (NEURONTIN) 400 MG capsule Take 400 mg by mouth at bedtime.     levocetirizine (XYZAL) 5 MG tablet TAKE ONE TABLET BY MOUTH TWICE DAILY AS DIRECTED 60 tablet 0   levothyroxine (SYNTHROID) 88 MCG tablet Take 88 mcg by mouth daily.  3   ondansetron (ZOFRAN-ODT) 4 MG disintegrating tablet Take 4 mg by mouth every 8 (eight) hours as needed.     promethazine (PHENERGAN) 25 MG tablet Take 25 mg by mouth 2 (two) times daily as needed.     scopolamine (TRANSDERM-SCOP) 1 MG/3DAYS Place 1 patch (1.5 mg total) onto the skin every 3 (three) days. 10 patch 12   No current facility-administered medications for this visit.    PHYSICAL EXAMINATION:  ECOG PERFORMANCE STATUS: 2 - Symptomatic, <50% confined to bed   There were no vitals filed for this visit.  There were no vitals filed for this visit.   Physical Exam Vitals and nursing note reviewed.  Constitutional:      General: She is not in acute distress.    Appearance: Normal appearance. She is obese. She is not toxic-appearing or diaphoretic.     Comments: Here with mother and daughter.  Less ill appearing than during hospitalization.  Uncomfortable.    HENT:     Head: Normocephalic and atraumatic.     Right Ear: External ear normal.     Left Ear: External ear normal.     Nose: Nose normal. No congestion or rhinorrhea.      Mouth/Throat:     Mouth: Mucous membranes are moist.  Eyes:     General: No scleral icterus.    Extraocular Movements: Extraocular movements intact.     Conjunctiva/sclera: Conjunctivae normal.     Pupils: Pupils are equal, round, and reactive to light.  Cardiovascular:     Rate and Rhythm: Normal rate and regular rhythm.     Heart sounds: Normal heart sounds. No murmur heard.    No friction rub. No gallop.  Pulmonary:     Effort: Pulmonary effort is normal. No respiratory distress.     Breath sounds: Normal breath sounds. No stridor. No wheezing or rales.  Chest:     Chest wall: Tenderness present.  Abdominal:     General: Bowel sounds are normal. There is no distension.     Palpations: Abdomen is soft.     Tenderness: There is no abdominal tenderness. There is no guarding or rebound.  Musculoskeletal:        General: No swelling, tenderness or deformity.     Cervical back: Normal range of motion and neck supple. No rigidity or tenderness.     Right lower leg: Edema present.     Left lower leg: Edema present.  Lymphadenopathy:     Head:     Right side of head: No submental, submandibular, tonsillar, preauricular, posterior auricular or occipital adenopathy.     Left side of head: No submental, submandibular, tonsillar, preauricular, posterior auricular or occipital adenopathy.     Cervical: No cervical adenopathy.     Right cervical: No superficial, deep or posterior cervical adenopathy.    Left cervical: No superficial, deep or posterior cervical adenopathy.     Upper Body:  Right upper body: No supraclavicular, axillary, pectoral or epitrochlear adenopathy.     Left upper body: No supraclavicular, axillary, pectoral or epitrochlear adenopathy.  Skin:    General: Skin is warm.     Coloration: Skin is not jaundiced.     Findings: No bruising, erythema or rash.  Neurological:     General: No focal deficit present.     Mental Status: She is alert and oriented to person,  place, and time. Mental status is at baseline.     Cranial Nerves: No cranial nerve deficit.     Sensory: No sensory deficit.  Psychiatric:        Mood and Affect: Mood normal.        Behavior: Behavior normal.        Thought Content: Thought content normal.        Judgment: Judgment normal.     LABORATORY DATA: I have personally reviewed the data as listed:  Appointment on 06/22/2022  Component Date Value Ref Range Status   Sodium 06/22/2022 129 (L)  135 - 145 mmol/L Final   Potassium 06/22/2022 3.1 (L)  3.5 - 5.1 mmol/L Final   Chloride 06/22/2022 93 (L)  98 - 111 mmol/L Final   CO2 06/22/2022 23  22 - 32 mmol/L Final   Glucose, Bld 06/22/2022 206 (H)  70 - 99 mg/dL Final   Glucose reference range applies only to samples taken after fasting for at least 8 hours.   BUN 06/22/2022 36 (H)  6 - 20 mg/dL Final   Creatinine 06/22/2022 2.35 (H)  0.44 - 1.00 mg/dL Final   Calcium 06/22/2022 8.7 (L)  8.9 - 10.3 mg/dL Final   Total Protein 06/22/2022 >12.0 (H)  6.5 - 8.1 g/dL Final   RESULTS VERIFIED BY REPEAT TESTING   Albumin 06/22/2022 1.8 (L)  3.5 - 5.0 g/dL Final   AST 06/22/2022 35  15 - 41 U/L Final   ALT 06/22/2022 26  0 - 44 U/L Final   Alkaline Phosphatase 06/22/2022 70  38 - 126 U/L Final   Total Bilirubin 06/22/2022 0.6  0.3 - 1.2 mg/dL Final   GFR, Estimated 06/22/2022 24 (L)  >60 mL/min Final   Comment: (NOTE) Calculated using the CKD-EPI Creatinine Equation (2021)    Anion gap 06/22/2022 13  5 - 15 Final   Performed at [facility], [place] [address], [place], [place] [address]   WBC Count 06/22/2022 8.4  4.0 - 10.5 K/uL Final   RBC 06/22/2022 3.87  3.87 - 5.11 MIL/uL Final   Hemoglobin 06/22/2022 11.0 (L)  12.0 - 15.0 g/dL Final   HCT 06/22/2022 34.0 (L)  36.0 - 46.0 % Final   MCV 06/22/2022 87.9  80.0 - 100.0 fL Final   MCH 06/22/2022 28.4  26.0 - 34.0 pg Final   MCHC 06/22/2022 32.4  30.0 - 36.0 g/dL Final   RDW 06/22/2022 19.3 (H)  11.5 - 15.5 %  Final   Platelet Count 06/22/2022 73 (L)  150 - 400 K/uL Final   Comment: SPECIMEN CHECKED FOR CLOTS Immature Platelet Fraction may be clinically indicated, consider ordering this additional test [id number] REPEATED TO VERIFY PLATELET COUNT CONFIRMED BY SMEAR    nRBC 06/22/2022 0.8 (H)  0.0 - 0.2 % Final   Neutrophils Relative % 06/22/2022 51  % Final   Neutro Abs 06/22/2022 4.3  1.7 - 7.7 K/uL Final   Lymphocytes Relative 06/22/2022 28  % Final   Lymphs Abs 06/22/2022 2.4  0.7 -  4.0 K/uL Final   Monocytes Relative 06/22/2022 4  % Final   Monocytes Absolute 06/22/2022 0.4  0.1 - 1.0 K/uL Final   Eosinophils Relative 06/22/2022 1  % Final   Eosinophils Absolute 06/22/2022 0.1  0.0 - 0.5 K/uL Final   Basophils Relative 06/22/2022 1  % Final   Basophils Absolute 06/22/2022 0.1  0.0 - 0.1 K/uL Final   WBC Morphology 06/22/2022 MILD LEFT SHIFT (1-5% METAS, OCC MYELO, OCC BANDS)   Final   Immature Granulocytes 06/22/2022 15  % Final   Abs Immature Granulocytes 06/22/2022 1.22 (H)  0.00 - 0.07 K/uL Final   Rouleaux 06/22/2022 PRESENT   Final   Performed at [facility], [place] [address] [place], [place] [address]  Office Visit on 06/22/2022  Component Date Value Ref Range Status   Beta-2 Microglobulin 06/22/2022 17.7 (H)  0.6 - 2.4 mg/L Final   Comment: (NOTE) Siemens Immulite 2000 Immunochemiluminometric assay (ICMA) Values obtained with different assay methods or kits cannot be used interchangeably. Results cannot be interpreted as absolute evidence of the presence or absence of malignant disease. Performed At: [facility] [place], [place] [id number] Rush Farmer MD YJ:[id number]   Hospital Outpatient Visit on 06/19/2022  Component Date Value Ref Range Status   SURGICAL PATHOLOGY 06/19/2022    Final-Edited                   Value:Surgical Pathology CASE: [id number] PATIENT: Magali Neumeyer Bone Marrow Report     Clinical History:  Multiple Myeloma not having achieved remission  ([place])     DIAGNOSIS:  BONE MARROW, ASPIRATE, CLOT, CORE: -Hypercellular bone marrow with plasma cell neoplasm -See comment  PERIPHERAL BLOOD: -Normocytic-normochromic anemia -Neutrophilic left shift -Thrombocytopenia  COMMENT:  The bone marrow shows extensive involvement by atypical plasma cells representing 70% of all cells in the aspirate associated with variably sized aggregates and diffuse sheets in the clot and biopsy sections. The plasma cells display kappa light chain restriction consistent with plasma cell neoplasm.  Correlation with cytogenetic and FISH studies is recommended.  MICROSCOPIC DESCRIPTION:  PERIPHERAL BLOOD SMEAR: The red blood cells display mild anisopoikilocytosis with mild polychromasia and prominent rouleaux formation.  The white blood cells are normal in number with scattered                          neutrophils displaying mild toxic granulation.  This is associated with neutrophilic left shift.  The platelets are decreased in number.  BONE MARROW ASPIRATE: Bone marrow particles present with prominent degenerative cellular changes. Erythroid precursors: Relatively decreased but with progressive maturation.  Scattered late precursors display nuclear cytoplasmic dyssynchrony or irregular nuclei. Granulocytic precursors: Relatively decreased but with progressive maturation. Megakaryocytes: Appear decreased with scattered forms present Lymphocytes/plasma cells: The plasma cells are prominently increased in number representing 70% of all cells associated with atypical cytomorphologic features characterized by cytomegaly and/or small nucleoli.  Large lymphoid aggregates are not seen.  TOUCH PREPARATIONS: A mixture of cell types but with relative abundance of atypical plasma cells  CLOT AND BIOPSY: The sections show 80 to 100% cellularity with extensive replacement o                         f the  medullary space by diffuse sheets and variably sized aggregates of atypical plasma cells characterized by partially clumped to vesicular chormaltin and small nucleoli.  Myeloid hematopoiesis is  decreased with small residual foci present.  Immunohistochemical stain for CD138 and in situ hybridization for kappa and lambda were performed on blocks B1 and C1 with appropriate controls.  CD138 highlights the extensive plasma cell component in the marrow which displays kappa light chain restriction.  IRON STAIN: Iron stains are performed on a bone marrow aspirate or touch imprint smear and section of clot. The controls stained appropriately.       Storage Iron: Present      Ring Sideroblasts: Absent  ADDITIONAL DATA/TESTING: The bone marrow specimen was sent for cytogenetic and analysis and FISH for multiple myeloma and a separate report will follow.  CELL COUNT DATA:  Bone Marrow count performed on 500 cells shows: Blasts:   0%   Myeloid:  22% Promyelocytes: 0%                            Erythroid:     5% Myelocytes:    1%   Lymphocytes:   2% Metamyelocytes:     0%   Plasma cells:  70% Bands:    1% Neutrophils:   18%  M:E ratio:     4.4 Eosinophils:   2% Basophils:     0% Monocytes:     1%  Lab Data: CBC performed on 06/20/2022 shows: WBC: 6.2 k/uL  Neutrophils:   61%  Myelocytes:           1% Hgb: 7.0 g/dL  Lymphocytes:   30%  Bands:                   2% HCT: 19.4 %    Monocytes:     6% MCV: 87 fL     Eosinophils:   0% RDW: 17.1 %    Basophils:     0% PLT: 57 k/uL    GROSS DESCRIPTION:  A: Aspirate smear  B: Received in B-plus fixative are tissue fragments measuring 0.5 x 0.2 x 0.1 cm in aggregate.  The specimen is submitted in toto.  C: Received in B-plus fixative is a 0.9 x 0.2 cm core of bone.  The specimen is submitted in toto following decalcification.  [facility] 06/19/2022)   Final Diagnosis performed by Susanne Greenhouse, MD.   Electronically  signed 06/21/2022 Technical and / or Professional components performed at [facility], [place] [address] [place], [place] [address].  Immunohistochemistry Technical component (if applicable) was performed at [facility]. [address], [place], [place], [place] [address].   IMMUNOHISTOCHEMISTRY DISCLAIMER (if applicable): Some of these immunohistochemical stains may have been developed and the performance characteristics determine by [facility]. Some may not have been cleared or approved by the U.S. Food and Drug Administration. The FDA has determined that such clearance or approval is not necessary. This test is used for clinical purposes. It should not be regarded as investigational or for research. This laboratory is certified under the [place] (CLIA-88) as qualified to perform high complexity clinical laboratory testing.  The controls stained appropriately.     RADIOGRAPHIC STUDIES: I have personally reviewed the radiological images as listed and agree with the findings in the report  No results found.  ASSESSMENT/PLAN 56 year old female who on June 14 2022 presented with acute renal failure, hypercalcemia,  elevated total protein in setting of hypoalbuminemia, cytopenias (anemia, thrombocytopenia), lytic bone lesions which has lead to the diagnosis of multiple myeloma.    Multiple myeloma ISS-Stage III, Biclonal IgA kappa + kappa light chain escape/ FISH with TP 53 deletion and Cyclin D1/IgH fusion:  Diagnosed in October 2023 after patient presented with hypercalcemia, AKI, wide spread lytic lesions in skeleton, cytopenias.  At presentation serum free kappa 3067 lambda 2.3 with a kappa lambda 1333. IgA >6400 Blood viscosity 5.0  24 hr urine showed loss of 1582 mg protein.  UPEP  showed loss of 1023 mg of paraprotein.   BM Bx showed extensive involvement by  plasma cells with kappa restriction.    Therapeutics:  Patient has high risk disease and is a stem cell transplant candidate  June 29, 2022: Cycle 1 day 1 CyBorD.  Initiation of chemotherapy delayed due to insurance reasons.  Cycle 1 Day 8 interrupted because of hemothorax July 13 2022:  To resume CyBorD.   Will check myeloma labs to reassess status of disease.  Since additional information has become available since time of myeloma dx (FISH in particular) which indicates how severe her risk is I am writing to change chemotherapy to Velcade Revlimid Dexamethasone + Daratumumab.  Will need to dose reduce Revlimid because of renal function  Hypercalcemia:  Secondary to myeloma.  Was treated with IVF, calcitonin, Dexamethasone, Zometa.  Remains on Dexamethasone 4 mg  daily for maintenance until chemotherapy for myeloma starts.    Pulmonary HTN:  Has been reported in Multiple Myeloma.  Multifactorial can be related to caused by pulmonary vasculopathy, endothelial dysfunction, pulmonary vascular amyloidosis, hyperviscosity.  Noted to have hyperviscosity on labs drawn during the hospitalization.  Not hypoxemic so doubt PE  Cancer related pain:  Due to bone lesions from myeloma.  Will improve with treatment.   Patient to send a message about her current supply and usage of Oxycodone.  May benefit from long acting narcotic in the future.     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  Underwent chest tube placement  Anemia:  Multifactorial.  Secondary to myeloma, renal insufficiency and bleeding (hemothorax)  Will follow Hgb and transfuse as needed for Hgb < 8 g/dL.  To have phenotyping performed in preparation for starting daratumumab      Cancer Staging  No matching staging information was found for the  patient.   No problem-specific Assessment & Plan notes found for this encounter.   No orders of the defined types were placed in this encounter.   All questions were answered. The patient knows to call the clinic with any problems, questions or concerns.  This note was electronically signed.    Barbee Cough, MD  07/06/2022 9:12 AM

## 2022-07-07 LAB — IMMUNOFIXATION ELECTROPHORESIS

## 2022-07-08 ENCOUNTER — Encounter: Payer: Self-pay | Admitting: Oncology

## 2022-07-10 ENCOUNTER — Encounter: Payer: Self-pay | Admitting: Oncology

## 2022-07-11 ENCOUNTER — Telehealth: Payer: Self-pay | Admitting: Oncology

## 2022-07-11 ENCOUNTER — Encounter: Payer: Self-pay | Admitting: Oncology

## 2022-07-11 ENCOUNTER — Ambulatory Visit: Payer: 59

## 2022-07-11 ENCOUNTER — Other Ambulatory Visit: Payer: Self-pay

## 2022-07-11 ENCOUNTER — Inpatient Hospital Stay: Payer: 59 | Attending: Oncology

## 2022-07-11 ENCOUNTER — Inpatient Hospital Stay (INDEPENDENT_AMBULATORY_CARE_PROVIDER_SITE_OTHER): Payer: 59 | Admitting: Oncology

## 2022-07-11 VITALS — BP 137/63 | HR 111 | Temp 97.9°F | Resp 16 | Ht 65.4 in | Wt 176.2 lb

## 2022-07-11 DIAGNOSIS — R0789 Other chest pain: Secondary | ICD-10-CM | POA: Insufficient documentation

## 2022-07-11 DIAGNOSIS — J948 Other specified pleural conditions: Secondary | ICD-10-CM | POA: Insufficient documentation

## 2022-07-11 DIAGNOSIS — Z7901 Long term (current) use of anticoagulants: Secondary | ICD-10-CM | POA: Insufficient documentation

## 2022-07-11 DIAGNOSIS — M2550 Pain in unspecified joint: Secondary | ICD-10-CM | POA: Diagnosis not present

## 2022-07-11 DIAGNOSIS — G893 Neoplasm related pain (acute) (chronic): Secondary | ICD-10-CM

## 2022-07-11 DIAGNOSIS — M549 Dorsalgia, unspecified: Secondary | ICD-10-CM | POA: Diagnosis not present

## 2022-07-11 DIAGNOSIS — S2243XA Multiple fractures of ribs, bilateral, initial encounter for closed fracture: Secondary | ICD-10-CM | POA: Insufficient documentation

## 2022-07-11 DIAGNOSIS — R197 Diarrhea, unspecified: Secondary | ICD-10-CM | POA: Insufficient documentation

## 2022-07-11 DIAGNOSIS — Z79624 Long term (current) use of inhibitors of nucleotide synthesis: Secondary | ICD-10-CM | POA: Insufficient documentation

## 2022-07-11 DIAGNOSIS — J90 Pleural effusion, not elsewhere classified: Secondary | ICD-10-CM | POA: Insufficient documentation

## 2022-07-11 DIAGNOSIS — C9000 Multiple myeloma not having achieved remission: Secondary | ICD-10-CM

## 2022-07-11 DIAGNOSIS — R5383 Other fatigue: Secondary | ICD-10-CM | POA: Insufficient documentation

## 2022-07-11 DIAGNOSIS — N179 Acute kidney failure, unspecified: Secondary | ICD-10-CM | POA: Diagnosis not present

## 2022-07-11 DIAGNOSIS — Z8249 Family history of ischemic heart disease and other diseases of the circulatory system: Secondary | ICD-10-CM | POA: Insufficient documentation

## 2022-07-11 DIAGNOSIS — R410 Disorientation, unspecified: Secondary | ICD-10-CM | POA: Insufficient documentation

## 2022-07-11 DIAGNOSIS — M791 Myalgia, unspecified site: Secondary | ICD-10-CM | POA: Insufficient documentation

## 2022-07-11 DIAGNOSIS — E8809 Other disorders of plasma-protein metabolism, not elsewhere classified: Secondary | ICD-10-CM | POA: Insufficient documentation

## 2022-07-11 DIAGNOSIS — M797 Fibromyalgia: Secondary | ICD-10-CM | POA: Insufficient documentation

## 2022-07-11 DIAGNOSIS — Z8349 Family history of other endocrine, nutritional and metabolic diseases: Secondary | ICD-10-CM | POA: Insufficient documentation

## 2022-07-11 DIAGNOSIS — M1990 Unspecified osteoarthritis, unspecified site: Secondary | ICD-10-CM | POA: Diagnosis not present

## 2022-07-11 DIAGNOSIS — Z5112 Encounter for antineoplastic immunotherapy: Secondary | ICD-10-CM | POA: Diagnosis not present

## 2022-07-11 DIAGNOSIS — R0489 Hemorrhage from other sites in respiratory passages: Secondary | ICD-10-CM | POA: Insufficient documentation

## 2022-07-11 DIAGNOSIS — R432 Parageusia: Secondary | ICD-10-CM | POA: Insufficient documentation

## 2022-07-11 DIAGNOSIS — D649 Anemia, unspecified: Secondary | ICD-10-CM | POA: Insufficient documentation

## 2022-07-11 DIAGNOSIS — M4854XA Collapsed vertebra, not elsewhere classified, thoracic region, initial encounter for fracture: Secondary | ICD-10-CM | POA: Insufficient documentation

## 2022-07-11 DIAGNOSIS — R519 Headache, unspecified: Secondary | ICD-10-CM | POA: Insufficient documentation

## 2022-07-11 DIAGNOSIS — H538 Other visual disturbances: Secondary | ICD-10-CM | POA: Insufficient documentation

## 2022-07-11 DIAGNOSIS — K219 Gastro-esophageal reflux disease without esophagitis: Secondary | ICD-10-CM | POA: Insufficient documentation

## 2022-07-11 DIAGNOSIS — I3139 Other pericardial effusion (noninflammatory): Secondary | ICD-10-CM | POA: Diagnosis not present

## 2022-07-11 DIAGNOSIS — I272 Pulmonary hypertension, unspecified: Secondary | ICD-10-CM | POA: Insufficient documentation

## 2022-07-11 DIAGNOSIS — Z833 Family history of diabetes mellitus: Secondary | ICD-10-CM | POA: Insufficient documentation

## 2022-07-11 DIAGNOSIS — D696 Thrombocytopenia, unspecified: Secondary | ICD-10-CM

## 2022-07-11 DIAGNOSIS — N189 Chronic kidney disease, unspecified: Secondary | ICD-10-CM | POA: Insufficient documentation

## 2022-07-11 DIAGNOSIS — Z7952 Long term (current) use of systemic steroids: Secondary | ICD-10-CM | POA: Insufficient documentation

## 2022-07-11 DIAGNOSIS — J942 Hemothorax: Secondary | ICD-10-CM

## 2022-07-11 DIAGNOSIS — E039 Hypothyroidism, unspecified: Secondary | ICD-10-CM | POA: Diagnosis not present

## 2022-07-11 DIAGNOSIS — Z79890 Hormone replacement therapy (postmenopausal): Secondary | ICD-10-CM | POA: Insufficient documentation

## 2022-07-11 DIAGNOSIS — E8889 Other specified metabolic disorders: Secondary | ICD-10-CM | POA: Diagnosis not present

## 2022-07-11 DIAGNOSIS — Z79899 Other long term (current) drug therapy: Secondary | ICD-10-CM | POA: Insufficient documentation

## 2022-07-11 DIAGNOSIS — Z881 Allergy status to other antibiotic agents status: Secondary | ICD-10-CM | POA: Insufficient documentation

## 2022-07-11 DIAGNOSIS — K5900 Constipation, unspecified: Secondary | ICD-10-CM | POA: Insufficient documentation

## 2022-07-11 DIAGNOSIS — Z7969 Long term (current) use of other immunomodulators and immunosuppressants: Secondary | ICD-10-CM | POA: Insufficient documentation

## 2022-07-11 DIAGNOSIS — Z09 Encounter for follow-up examination after completed treatment for conditions other than malignant neoplasm: Secondary | ICD-10-CM

## 2022-07-11 DIAGNOSIS — D630 Anemia in neoplastic disease: Secondary | ICD-10-CM

## 2022-07-11 DIAGNOSIS — Z7961 Long term (current) use of immunomodulator: Secondary | ICD-10-CM | POA: Insufficient documentation

## 2022-07-11 DIAGNOSIS — Z888 Allergy status to other drugs, medicaments and biological substances status: Secondary | ICD-10-CM | POA: Insufficient documentation

## 2022-07-11 LAB — CBC WITH DIFFERENTIAL (CANCER CENTER ONLY)
Abs Immature Granulocytes: 0.17 10*3/uL — ABNORMAL HIGH (ref 0.00–0.07)
Basophils Absolute: 0 10*3/uL (ref 0.0–0.1)
Basophils Relative: 1 %
Eosinophils Absolute: 0 10*3/uL (ref 0.0–0.5)
Eosinophils Relative: 2 %
HCT: 24.7 % — ABNORMAL LOW (ref 36.0–46.0)
Hemoglobin: 7.6 g/dL — ABNORMAL LOW (ref 12.0–15.0)
Immature Granulocytes: 6 %
Lymphocytes Relative: 53 %
Lymphs Abs: 1.5 10*3/uL (ref 0.7–4.0)
MCH: 27.9 pg (ref 26.0–34.0)
MCHC: 30.8 g/dL (ref 30.0–36.0)
MCV: 90.8 fL (ref 80.0–100.0)
Monocytes Absolute: 0.2 10*3/uL (ref 0.1–1.0)
Monocytes Relative: 6 %
Neutro Abs: 0.9 10*3/uL — ABNORMAL LOW (ref 1.7–7.7)
Neutrophils Relative %: 32 %
Platelet Count: 93 10*3/uL — ABNORMAL LOW (ref 150–400)
RBC: 2.72 MIL/uL — ABNORMAL LOW (ref 3.87–5.11)
RDW: 18.6 % — ABNORMAL HIGH (ref 11.5–15.5)
WBC Count: 2.7 10*3/uL — ABNORMAL LOW (ref 4.0–10.5)
nRBC: 0 % (ref 0.0–0.2)

## 2022-07-11 LAB — CMP (CANCER CENTER ONLY)
ALT: 16 U/L (ref 0–44)
AST: 24 U/L (ref 15–41)
Albumin: 1.6 g/dL — ABNORMAL LOW (ref 3.5–5.0)
Alkaline Phosphatase: 69 U/L (ref 38–126)
Anion gap: 19 — ABNORMAL HIGH (ref 5–15)
BUN: 31 mg/dL — ABNORMAL HIGH (ref 6–20)
CO2: 19 mmol/L — ABNORMAL LOW (ref 22–32)
Calcium: 9.6 mg/dL (ref 8.9–10.3)
Chloride: 100 mmol/L (ref 98–111)
Creatinine: 2.89 mg/dL — ABNORMAL HIGH (ref 0.44–1.00)
GFR, Estimated: 19 mL/min — ABNORMAL LOW (ref >60)
Glucose, Bld: 91 mg/dL (ref 70–99)
Potassium: 4.4 mmol/L (ref 3.5–5.1)
Sodium: 138 mmol/L (ref 135–145)
Total Bilirubin: 0.6 mg/dL (ref 0.3–1.2)
Total Protein: 10.7 g/dL — ABNORMAL HIGH (ref 6.5–8.1)

## 2022-07-11 NOTE — Telephone Encounter (Signed)
07/11/22 Next appt scheduled and confirmed with patient

## 2022-07-11 NOTE — Progress Notes (Signed)
DISCONTINUE ON PATHWAY REGIMEN - Multiple Myeloma and Other Plasma Cell Dyscrasias     A cycle is every 28 days:     Dexamethasone      Bortezomib      Cyclophosphamide   **Always confirm dose/schedule in your pharmacy ordering system**  REASON: Other Reason PRIOR TREATMENT: [id number]: CyBord (Cyclophosphamide 300 mg/m2 IV D1, 8, 15, 22 + Bortezomib 1.5 mg/m2 SUBQ D1, 8, 15, 22 + Dexamethasone 40 mg PO D1, 8, 15, 22) q28 Days x 4 Cycles Concurrent with Referral to Transplant Service TREATMENT RESPONSE: Unable to Evaluate  START ON PATHWAY REGIMEN - Multiple Myeloma and Other Plasma Cell Dyscrasias   DaraVRd (Daratumumab IV + Bortezomib SUBQ + Lenalidomide PO + Dexamethasone IV/PO) q21 Days (Induction Schema):   A cycle is every 21 days:     Lenalidomide      Dexamethasone      Bortezomib      Daratumumab    DaraVRd (Daratumumab IV + Bortezomib SUBQ + Lenalidomide PO + Dexamethasone IV/PO) q21 Days (Consolidation Schema):   A cycle is every 21 days:     Lenalidomide      Dexamethasone      Bortezomib      Daratumumab   **Always confirm dose/schedule in your pharmacy ordering system**  Patient Characteristics: Multiple Myeloma, Newly Diagnosed, Transplant Eligible, High Risk Disease Classification: Multiple Myeloma R-ISS Staging: III Therapeutic Status: Newly Diagnosed Is Patient Eligible for Transplant<= Transplant Eligible Risk Status: High Risk Intent of Therapy: Curative Intent, Discussed with Patient

## 2022-07-12 ENCOUNTER — Encounter: Payer: Self-pay | Admitting: Oncology

## 2022-07-12 ENCOUNTER — Telehealth: Payer: Self-pay | Admitting: Pharmacy Technician

## 2022-07-12 ENCOUNTER — Other Ambulatory Visit: Payer: Self-pay

## 2022-07-12 ENCOUNTER — Telehealth: Payer: Self-pay | Admitting: Pharmacist

## 2022-07-12 ENCOUNTER — Other Ambulatory Visit: Payer: Self-pay | Admitting: Pharmacist

## 2022-07-12 ENCOUNTER — Other Ambulatory Visit (HOSPITAL_COMMUNITY): Payer: Self-pay

## 2022-07-12 ENCOUNTER — Inpatient Hospital Stay: Payer: 59

## 2022-07-12 DIAGNOSIS — Z5112 Encounter for antineoplastic immunotherapy: Secondary | ICD-10-CM | POA: Diagnosis not present

## 2022-07-12 DIAGNOSIS — C9000 Multiple myeloma not having achieved remission: Secondary | ICD-10-CM

## 2022-07-12 LAB — PREPARE RBC (CROSSMATCH)

## 2022-07-12 MED ORDER — LENALIDOMIDE 10 MG PO CAPS
10.0000 mg | ORAL_CAPSULE | Freq: Every day | ORAL | 3 refills | Status: DC
  Filled 2022-07-12: qty 14, 14d supply, fill #0

## 2022-07-12 MED FILL — Cyclophosphamide For Inj 1 GM: INTRAMUSCULAR | Qty: 29 | Status: AC

## 2022-07-12 MED FILL — Bortezomib For Inj 3.5 MG: INTRAMUSCULAR | Qty: 1.2 | Status: AC

## 2022-07-12 MED FILL — Dexamethasone Sodium Phosphate Inj 100 MG/10ML: INTRAMUSCULAR | Qty: 4 | Status: AC

## 2022-07-12 NOTE — Telephone Encounter (Unsigned)
Oral Oncology Pharmacist Encounter  Received new prescription for Revlimid (lenalidomide) for the treatment of multiple myeloma in conjunction with daratumumab, dexamethasone, and bortezomib, planned duration until disease control or unacceptable drug toxicity.  Patient will be transitioning from the treatment with CyBorD due to MD concern for lack of lack and patient's high risk disease.   CMP from 07/11/22 assessed, SCr elevated at 2.78m/dL and CrCl ~229mmin. Renal impairment is likely due to disease and will hopefully improve with time, continue to monitor. Prescription dose and frequency assessed. MD has dose decreased for the renal impairment.   Current medication list in Epic reviewed, no relevant DDIs with lenalidomide identified:  Evaluated chart and no patient barriers to medication adherence identified.   Oral Oncology Clinic will continue to follow for insurance authorization, copayment issues, initial counseling and start date.   AlDarl PikesPharmD, BCPS, BCOP, CPP Hematology/Oncology Clinical Pharmacist Practitioner [place]/AP [phone]/04/2022 12:35 PM

## 2022-07-12 NOTE — Telephone Encounter (Signed)
Lenalidomide prescription will be redirected once [employer] auth number is provided by office. Estill Bamberg, RN is working on obtaining this.

## 2022-07-12 NOTE — Telephone Encounter (Signed)
Oral Oncology Patient Advocate Encounter  Prior Authorization for Lenalidomide '10mg'$  has been approved.    PA# [id number] Effective dates: 07/12/22 through 07/13/23 Phone# [phone]  Patient must fill through [facility].  Patient eligible for copay card:  BIN [id number]  PCN N/A  GRP [id number]  ID [id number]     Or:   Or    Oral Oncology Clinic will continue to follow.

## 2022-07-12 NOTE — Progress Notes (Signed)
Ok to proceed with chemo despite hgb=7.6, plt=93, ANC=0.9, and creatinine=2.89 per Dr. Federico Flake.  Pt will receive 1 unit PRBC tomorrow with treatment.

## 2022-07-13 ENCOUNTER — Encounter: Payer: Self-pay | Admitting: Oncology

## 2022-07-13 ENCOUNTER — Inpatient Hospital Stay: Payer: 59

## 2022-07-13 VITALS — BP 140/63 | HR 110 | Temp 98.6°F | Resp 18

## 2022-07-13 DIAGNOSIS — C9000 Multiple myeloma not having achieved remission: Secondary | ICD-10-CM

## 2022-07-13 DIAGNOSIS — J942 Hemothorax: Secondary | ICD-10-CM | POA: Insufficient documentation

## 2022-07-13 DIAGNOSIS — Z5112 Encounter for antineoplastic immunotherapy: Secondary | ICD-10-CM | POA: Diagnosis not present

## 2022-07-13 DIAGNOSIS — Z09 Encounter for follow-up examination after completed treatment for conditions other than malignant neoplasm: Secondary | ICD-10-CM | POA: Insufficient documentation

## 2022-07-13 LAB — KAPPA/LAMBDA LIGHT CHAINS
Kappa free light chain: 4067.3 mg/L — ABNORMAL HIGH (ref 3.3–19.4)
Kappa, lambda light chain ratio: 1452.61 — ABNORMAL HIGH (ref 0.26–1.65)
Lambda free light chains: 2.8 mg/L — ABNORMAL LOW (ref 5.7–26.3)

## 2022-07-13 LAB — PRETREATMENT RBC PHENOTYPE

## 2022-07-13 LAB — ABO/RH: ABO/RH(D): O POS

## 2022-07-13 MED ORDER — SODIUM CHLORIDE 0.9% FLUSH: 10.0000 mL | INTRAVENOUS | Status: DC | PRN

## 2022-07-13 MED ORDER — SODIUM CHLORIDE 0.9% FLUSH
10.0000 mL | INTRAVENOUS | Status: DC | PRN
  Administered 2022-07-13: 10 mL

## 2022-07-13 MED ORDER — SODIUM CHLORIDE 0.9% IV SOLUTION
250.0000 mL | Freq: Once | INTRAVENOUS | Status: AC
  Administered 2022-07-13: 250 mL via INTRAVENOUS

## 2022-07-13 MED ORDER — BORTEZOMIB CHEMO SQ INJECTION 3.5 MG (2.5MG/ML)
1.5000 mg/m2 | Freq: Once | INTRAMUSCULAR | Status: AC
  Administered 2022-07-13: 3 mg via SUBCUTANEOUS
  Filled 2022-07-13: qty 1.2

## 2022-07-13 MED ORDER — HEPARIN SOD (PORK) LOCK FLUSH 100 UNIT/ML IV SOLN: 500.0000 [IU] | Freq: Every day | INTRAVENOUS | Status: DC | PRN

## 2022-07-13 MED ORDER — SODIUM CHLORIDE 0.9 % IV SOLN
300.0000 mg/m2 | Freq: Once | INTRAVENOUS | Status: AC
  Administered 2022-07-13: 580 mg via INTRAVENOUS
  Filled 2022-07-13: qty 29

## 2022-07-13 MED ORDER — PALONOSETRON HCL INJECTION 0.25 MG/5ML
0.2500 mg | Freq: Once | INTRAVENOUS | Status: AC
  Administered 2022-07-13: 0.25 mg via INTRAVENOUS
  Filled 2022-07-13: qty 5

## 2022-07-13 MED ORDER — HEPARIN SOD (PORK) LOCK FLUSH 100 UNIT/ML IV SOLN
500.0000 [IU] | Freq: Once | INTRAVENOUS | Status: AC | PRN
  Administered 2022-07-13: 500 [IU]

## 2022-07-13 MED ORDER — TRAZODONE HCL 50 MG PO TABS: 50.0000 mg | ORAL_TABLET | Freq: Every day | ORAL | 1 refills | Status: DC

## 2022-07-13 MED ORDER — SODIUM CHLORIDE 0.9 % IV SOLN
40.0000 mg | Freq: Once | INTRAVENOUS | Status: AC
  Administered 2022-07-13: 40 mg via INTRAVENOUS
  Filled 2022-07-13: qty 4

## 2022-07-13 MED ORDER — DIPHENHYDRAMINE HCL 25 MG PO CAPS: 25.0000 mg | ORAL_CAPSULE | Freq: Once | ORAL | Status: DC

## 2022-07-13 MED ORDER — ZOLEDRONIC ACID 4 MG/100ML IV SOLN: 4.0000 mg | Freq: Once | INTRAVENOUS | Status: DC

## 2022-07-13 MED ORDER — ACETAMINOPHEN 325 MG PO TABS: 650.0000 mg | ORAL_TABLET | Freq: Once | ORAL | Status: DC

## 2022-07-13 MED ORDER — LENALIDOMIDE 10 MG PO CAPS: 10.0000 mg | ORAL_CAPSULE | Freq: Every day | ORAL | 0 refills | Status: DC

## 2022-07-13 MED ORDER — SODIUM CHLORIDE 0.9 % IV SOLN: Freq: Once | INTRAVENOUS | Status: AC

## 2022-07-13 NOTE — Patient Instructions (Signed)
Blood Transfusion, Adult, Care After The following information offers guidance on how to care for yourself after your procedure. Your health care provider may also give you more specific instructions. If you have problems or questions, contact your health care provider. What can I expect after the procedure? After the procedure, it is common to have: Bruising and soreness where the IV was inserted. A headache. Follow these instructions at home: IV insertion site care     Follow instructions from your health care provider about how to take care of your IV insertion site. Make sure you: Wash your hands with soap and water for at least 20 seconds before and after you change your bandage (dressing). If soap and water are not available, use hand sanitizer. Change your dressing as told by your health care provider. Check your IV insertion site every day for signs of infection. Check for: Redness, swelling, or pain. Bleeding from the site. Warmth. Pus or a bad smell. General instructions Take over-the-counter and prescription medicines only as told by your health care provider. Rest as told by your health care provider. Return to your normal activities as told by your health care provider. Keep all follow-up visits. Lab tests may need to be done at certain periods to recheck your blood counts. Contact a health care provider if: You have itching or red, swollen areas of skin (hives). You have a fever or chills. You have pain in the head, back, or chest. You feel anxious or you feel weak after doing your normal activities. You have redness, swelling, warmth, or pain around the IV insertion site. You have blood coming from the IV insertion site that does not stop with pressure. You have pus or a bad smell coming from your IV insertion site. If you received your blood transfusion in an outpatient setting, you will be told whom to contact to report any reactions. Get help right away if: You  have symptoms of a serious allergic or immune system reaction, including: Trouble breathing or shortness of breath. Swelling of the face, feeling flushed, or widespread rash. Dark urine or blood in the urine. Fast heartbeat. These symptoms may be an emergency. Get help right away. Call 911. Do not wait to see if the symptoms will go away. Do not drive yourself to the hospital. Summary Bruising and soreness around the IV insertion site are common. Check your IV insertion site every day for signs of infection. Rest as told by your health care provider. Return to your normal activities as told by your health care provider. Get help right away for symptoms of a serious allergic or immune system reaction to the blood transfusion. This information is not intended to replace advice given to you by your health care provider. Make sure you discuss any questions you have with your health care provider. Document Revised: 11/18/2021 Document Reviewed: 11/18/2021 Elsevier Patient Education  [place]. Cyclophosphamide Injection What is this medication? CYCLOPHOSPHAMIDE (sye kloe FOSS fa mide) treats some types of cancer. It works by slowing down the growth of cancer cells. This medicine may be used for other purposes; ask your health care provider or pharmacist if you have questions. COMMON BRAND NAME(S): Cyclophosphamide, Cytoxan, Neosar What should I tell my care team before I take this medication? They need to know if you have any of these conditions: Heart disease Irregular heartbeat or rhythm Infection Kidney problems Liver disease Low blood cell levels (white cells, platelets, or red blood cells) Lung disease Previous radiation Trouble  passing urine An unusual or allergic reaction to cyclophosphamide, other medications, foods, dyes, or preservatives Pregnant or trying to get pregnant Breast-feeding How should I use this medication? This medication is injected into a vein. It is  given by your care team in a hospital or clinic setting. Talk to your care team about the use of this medication in children. Special care may be needed. Overdosage: If you think you have taken too much of this medicine contact a poison control center or emergency room at once. NOTE: This medicine is only for you. Do not share this medicine with others. What if I miss a dose? Keep appointments for follow-up doses. It is important not to miss your dose. Call your care team if you are unable to keep an appointment. What may interact with this medication? Amphotericin B Amiodarone Azathioprine Certain antivirals for HIV or hepatitis Certain medications for blood pressure, such as enalapril, lisinopril, quinapril Cyclosporine Diuretics Etanercept Indomethacin Medications that relax muscles Metronidazole Natalizumab Tamoxifen Warfarin This list may not describe all possible interactions. Give your health care provider a list of all the medicines, herbs, non-prescription drugs, or dietary supplements you use. Also tell them if you smoke, drink alcohol, or use illegal drugs. Some items may interact with your medicine. What should I watch for while using this medication? This medication may make you feel generally unwell. This is not uncommon as chemotherapy can affect healthy cells as well as cancer cells. Report any side effects. Continue your course of treatment even though you feel ill unless your care team tells you to stop. You may need blood work while you are taking this medication. This medication may increase your risk of getting an infection. Call your care team for advice if you get a fever, chills, sore throat, or other symptoms of a cold or flu. Do not treat yourself. Try to avoid being around people who are sick. Avoid taking medications that contain aspirin, acetaminophen, ibuprofen, naproxen, or ketoprofen unless instructed by your care team. These medications may hide a fever. Be  careful brushing or flossing your teeth or using a toothpick because you may get an infection or bleed more easily. If you have any dental work done, tell your dentist you are receiving this medication. Drink water or other fluids as directed. Urinate often, even at night. Some products may contain alcohol. Ask your care team if this medication contains alcohol. Be sure to tell all care teams you are taking this medicine. Certain medicines, like metronidazole and disulfiram, can cause an unpleasant reaction when taken with alcohol. The reaction includes flushing, headache, nausea, vomiting, sweating, and increased thirst. The reaction can last from 30 minutes to several hours. Talk to your care team if you wish to become pregnant or think you might be pregnant. This medication can cause serious birth defects if taken during pregnancy and for 1 year after the last dose. A negative pregnancy test is required before starting this medication. A reliable form of contraception is recommended while taking this medication and for 1 year after the last dose. Talk to your care team about reliable forms of contraception. Do not father a child while taking this medication and for 4 months after the last dose. Use a condom during this time period. Do not breast-feed while taking this medication or for 1 week after the last dose. This medication may cause infertility. Talk to your care team if you are concerned about your fertility. Talk to your care team about your  risk of cancer. You may be more at risk for certain types of cancer if you take this medication. What side effects may I notice from receiving this medication? Side effects that you should report to your care team as soon as possible: Allergic reactions--skin rash, itching, hives, swelling of the face, lips, tongue, or throat Dry cough, shortness of breath or trouble breathing Heart failure--shortness of breath, swelling of the ankles, feet, or hands,  sudden weight gain, unusual weakness or fatigue Heart muscle inflammation--unusual weakness or fatigue, shortness of breath, chest pain, fast or irregular heartbeat, dizziness, swelling of the ankles, feet, or hands Heart rhythm changes--fast or irregular heartbeat, dizziness, feeling faint or lightheaded, chest pain, trouble breathing Infection--fever, chills, cough, sore throat, wounds that don't heal, pain or trouble when passing urine, general feeling of discomfort or being unwell Kidney injury--decrease in the amount of urine, swelling of the ankles, hands, or feet Liver injury--right upper belly pain, loss of appetite, nausea, light-colored stool, dark yellow or brown urine, yellowing skin or eyes, unusual weakness or fatigue Low red blood cell level--unusual weakness or fatigue, dizziness, headache, trouble breathing Low sodium level--muscle weakness, fatigue, dizziness, headache, confusion Red or dark brown urine Unusual bruising or bleeding Side effects that usually do not require medical attention (report to your care team if they continue or are bothersome): Hair loss Irregular menstrual cycles or spotting Loss of appetite Nausea Pain, redness, or swelling with sores inside the mouth or throat Vomiting This list may not describe all possible side effects. Call your doctor for medical advice about side effects. You may report side effects to FDA at 1-800-FDA-1088. Where should I keep my medication? This medication is given in a hospital or clinic. It will not be stored at home. NOTE: This sheet is a summary. It may not cover all possible information. If you have questions about this medicine, talk to your doctor, pharmacist, or health care provider.  2023 Elsevier/Gold Standard (2021-10-11 00:00:00) Bortezomib Injection What is this medication? BORTEZOMIB (bor TEZ oh mib) treats lymphoma. It may also be used to treat multiple myeloma, a type of bone marrow cancer. It works by  blocking a protein that causes cancer cells to grow and multiply. This helps to slow or stop the spread of cancer cells. This medicine may be used for other purposes; ask your health care provider or pharmacist if you have questions. COMMON BRAND NAME(S): Velcade What should I tell my care team before I take this medication? They need to know if you have any of these conditions: Dehydration Diabetes Heart disease Liver disease Tingling of the fingers or toes or other nerve disorder An unusual or allergic reaction to bortezomib, other medications, foods, dyes, or preservatives If you or your partner are pregnant or trying to get pregnant Breastfeeding How should I use this medication? This medication is injected into a vein or under the skin. It is given by your care team in a hospital or clinic setting. Talk to your care team about the use of this medication in children. Special care may be needed. Overdosage: If you think you have taken too much of this medicine contact a poison control center or emergency room at once. NOTE: This medicine is only for you. Do not share this medicine with others. What if I miss a dose? Keep appointments for follow-up doses. It is important not to miss your dose. Call your care team if you are unable to keep an appointment. What may interact with this medication?  Ketoconazole Rifampin This list may not describe all possible interactions. Give your health care provider a list of all the medicines, herbs, non-prescription drugs, or dietary supplements you use. Also tell them if you smoke, drink alcohol, or use illegal drugs. Some items may interact with your medicine. What should I watch for while using this medication? Your condition will be monitored carefully while you are receiving this medication. You may need blood work while taking this medication. This medication may affect your coordination, reaction time, or judgment. Do not drive or operate  machinery until you know how this medication affects you. Sit up or stand slowly to reduce the risk of dizzy or fainting spells. Drinking alcohol with this medication can increase the risk of these side effects. This medication may increase your risk of getting an infection. Call your care team for advice if you get a fever, chills, sore throat, or other symptoms of a cold or flu. Do not treat yourself. Try to avoid being around people who are sick. Check with your care team if you have severe diarrhea, nausea, and vomiting, or if you sweat a lot. The loss of too much body fluid may make it dangerous for you to take this medication. Talk to your care team if you may be pregnant. Serious birth defects can occur if you take this medication during pregnancy and for 7 months after the last dose. You will need a negative pregnancy test before starting this medication. Contraception is recommended while taking this medication and for 7 months after the last dose. Your care team can help you find the option that works for you. If your partner can get pregnant, use a condom during sex while taking this medication and for 4 months after the last dose. Do not breastfeed while taking this medication and for 2 months after the last dose. This medication may cause infertility. Talk to your care team if you are concerned about your fertility. What side effects may I notice from receiving this medication? Side effects that you should report to your care team as soon as possible: Allergic reactions--skin rash, itching, hives, swelling of the face, lips, tongue, or throat Bleeding--bloody or black, tar-like stools, vomiting blood or brown material that looks like coffee grounds, red or dark brown urine, small red or purple spots on skin, unusual bruising or bleeding Bleeding in the brain--severe headache, stiff neck, confusion, dizziness, change in vision, numbness or weakness of the face, arm, or leg, trouble speaking,  trouble walking, vomiting Bowel blockage--stomach cramping, unable to have a bowel movement or pass gas, loss of appetite, vomiting Heart failure--shortness of breath, swelling of the ankles, feet, or hands, sudden weight gain, unusual weakness or fatigue Infection--fever, chills, cough, sore throat, wounds that don't heal, pain or trouble when passing urine, general feeling of discomfort or being unwell Liver injury--right upper belly pain, loss of appetite, nausea, light-colored stool, dark yellow or brown urine, yellowing skin or eyes, unusual weakness or fatigue Low blood pressure--dizziness, feeling faint or lightheaded, blurry vision Lung injury--shortness of breath or trouble breathing, cough, spitting up blood, chest pain, fever Pain, tingling, or numbness in the hands or feet Severe or prolonged diarrhea Stomach pain, bloody diarrhea, pale skin, unusual weakness or fatigue, decrease in the amount of urine, which may be signs of hemolytic uremic syndrome Sudden and severe headache, confusion, change in vision, seizures, which may be signs of posterior reversible encephalopathy syndrome (PRES) TTP--purple spots on the skin or inside the mouth, pale skin,  yellowing skin or eyes, unusual weakness or fatigue, fever, fast or irregular heartbeat, confusion, change in vision, trouble speaking, trouble walking Tumor lysis syndrome (TLS)--nausea, vomiting, diarrhea, decrease in the amount of urine, dark urine, unusual weakness or fatigue, confusion, muscle pain or cramps, fast or irregular heartbeat, joint pain Side effects that usually do not require medical attention (report to your care team if they continue or are bothersome): Constipation Diarrhea Fatigue Loss of appetite Nausea This list may not describe all possible side effects. Call your doctor for medical advice about side effects. You may report side effects to FDA at 1-800-FDA-1088. Where should I keep my medication? This medication  is given in a hospital or clinic. It will not be stored at home. NOTE: This sheet is a summary. It may not cover all possible information. If you have questions about this medicine, talk to your doctor, pharmacist, or health care provider.  2023 Elsevier/Gold Standard (2022-01-18 00:00:00) Zoledronic Acid Injection (Cancer) What is this medication? ZOLEDRONIC ACID (ZOE le dron ik AS id) treats high calcium levels in the blood caused by cancer. It may also be used with chemotherapy to treat weakened bones caused by cancer. It works by slowing down the release of calcium from bones. This lowers calcium levels in your blood. It also makes your bones stronger and less likely to break (fracture). It belongs to a group of medications called bisphosphonates. This medicine may be used for other purposes; ask your health care provider or pharmacist if you have questions. COMMON BRAND NAME(S): Zometa, Zometa Powder What should I tell my care team before I take this medication? They need to know if you have any of these conditions: Dehydration Dental disease Kidney disease Liver disease Low levels of calcium in the blood Lung or breathing disease, such as asthma Receiving steroids, such as dexamethasone or prednisone An unusual or allergic reaction to zoledronic acid, other medications, foods, dyes, or preservatives Pregnant or trying to get pregnant Breast-feeding How should I use this medication? This medication is injected into a vein. It is given by your care team in a hospital or clinic setting. Talk to your care team about the use of this medication in children. Special care may be needed. Overdosage: If you think you have taken too much of this medicine contact a poison control center or emergency room at once. NOTE: This medicine is only for you. Do not share this medicine with others. What if I miss a dose? Keep appointments for follow-up doses. It is important not to miss your dose. Call  your care team if you are unable to keep an appointment. What may interact with this medication? Certain antibiotics given by injection Diuretics, such as bumetanide, furosemide NSAIDs, medications for pain and inflammation, such as ibuprofen or naproxen Teriparatide Thalidomide This list may not describe all possible interactions. Give your health care provider a list of all the medicines, herbs, non-prescription drugs, or dietary supplements you use. Also tell them if you smoke, drink alcohol, or use illegal drugs. Some items may interact with your medicine. What should I watch for while using this medication? Visit your care team for regular checks on your progress. It may be some time before you see the benefit from this medication. Some people who take this medication have severe bone, joint, or muscle pain. This medication may also increase your risk for jaw problems or a broken thigh bone. Tell your care team right away if you have severe pain in your jaw, bones,  joints, or muscles. Tell you care team if you have any pain that does not go away or that gets worse. Tell your dentist and dental surgeon that you are taking this medication. You should not have major dental surgery while on this medication. See your dentist to have a dental exam and fix any dental problems before starting this medication. Take good care of your teeth while on this medication. Make sure you see your dentist for regular follow-up appointments. You should make sure you get enough calcium and vitamin D while you are taking this medication. Discuss the foods you eat and the vitamins you take with your care team. Check with your care team if you have severe diarrhea, nausea, and vomiting, or if you sweat a lot. The loss of too much body fluid may make it dangerous for you to take this medication. You may need bloodwork while taking this medication. Talk to your care team if you wish to become pregnant or think you might be  pregnant. This medication can cause serious birth defects. What side effects may I notice from receiving this medication? Side effects that you should report to your care team as soon as possible: Allergic reactions--skin rash, itching, hives, swelling of the face, lips, tongue, or throat Kidney injury--decrease in the amount of urine, swelling of the ankles, hands, or feet Low calcium level--muscle pain or cramps, confusion, tingling, or numbness in the hands or feet Osteonecrosis of the jaw--pain, swelling, or redness in the mouth, numbness of the jaw, poor healing after dental work, unusual discharge from the mouth, visible bones in the mouth Severe bone, joint, or muscle pain Side effects that usually do not require medical attention (report to your care team if they continue or are bothersome): Constipation Fatigue Fever Loss of appetite Nausea Stomach pain This list may not describe all possible side effects. Call your doctor for medical advice about side effects. You may report side effects to FDA at 1-800-FDA-1088. Where should I keep my medication? This medication is given in a hospital or clinic. It will not be stored at home. NOTE: This sheet is a summary. It may not cover all possible information. If you have questions about this medicine, talk to your doctor, pharmacist, or health care provider.  2023 Elsevier/Gold Standard (2021-10-06 00:00:00)

## 2022-07-14 ENCOUNTER — Encounter: Payer: Self-pay | Admitting: Oncology

## 2022-07-14 LAB — BPAM RBC
Blood Product Expiration Date: 202311182359
ISSUE DATE / TIME: 202311091125
Unit Type and Rh: 9500

## 2022-07-14 LAB — TYPE AND SCREEN
ABO/RH(D): O POS
Antibody Screen: POSITIVE
Donor AG Type: NEGATIVE
PT AG Type: NEGATIVE
Unit division: 0

## 2022-07-14 NOTE — Telephone Encounter (Signed)
Left patient a HIPAA complaint VM advising of new rx sent to [facility], patient is to start on 11/16. Pharmacist will call to counsel on new medication.

## 2022-07-14 NOTE — Telephone Encounter (Signed)
Prescription redirected to [facility] on 07/13/22. Rachael, patient advocate, to call patient and let her know.

## 2022-07-14 NOTE — Telephone Encounter (Addendum)
Oral Chemotherapy Pharmacist Encounter  [facility] will deliver medication on 07/17/22. Patient will take her first dose on 07/20/22.  Patient Education I spoke with patient for overview of new oral chemotherapy medication: Revlimid (lenalidomide) for the treatment of multiple myeloma in conjunction with daratumumab, dexamethasone, and bortezomib, planned duration until disease control or unacceptable drug toxicity. Planned start 07/20/22.   Patient will be transitioning from the treatment with CyBorD due to MD concern for lack of lack and patient's high risk disease.   Pt is doing well. Counseled patient on administration, dosing, side effects, monitoring, drug-food interactions, safe handling, storage, and disposal. Patient will take 1 capsule (10 mg total) by mouth daily. Take for 14 days, then hold for 7 days. Repeat every 21 days.   Patient will take aspirin 25m daily for thromboprophylaxis.  Side effects include but not limited to: rash/itchy skin, N/V, fatigue, decreased wbc/hgb/plt, constipation or diarrhea.    Reviewed with patient importance of keeping a medication schedule and plan for any missed doses.  After discussion with patient no patient barriers to medication adherence identified.   Ms. WBerlandvoiced understanding and appreciation. All questions answered. Medication handout provided.  [facility] Patient knows to call the office with questions or concerns. Oral Chemotherapy [facility] will continue to follow.  ADarl Pikes PharmD, BCPS, BCOP, CPP Hematology/Oncology Clinical Pharmacist Practitioner [place]/[phone] 07/14/2022 2:34 PM

## 2022-07-14 NOTE — Addendum Note (Signed)
Addended by: Darl Pikes on: 07/14/2022 03:11 PM   Modules accepted: Orders

## 2022-07-17 ENCOUNTER — Encounter: Payer: Self-pay | Admitting: Oncology

## 2022-07-18 ENCOUNTER — Inpatient Hospital Stay (INDEPENDENT_AMBULATORY_CARE_PROVIDER_SITE_OTHER): Payer: 59 | Admitting: Oncology

## 2022-07-18 ENCOUNTER — Inpatient Hospital Stay: Payer: 59

## 2022-07-18 ENCOUNTER — Encounter: Payer: Self-pay | Admitting: Oncology

## 2022-07-18 VITALS — BP 144/65 | HR 101 | Temp 97.5°F | Resp 18 | Ht 65.4 in | Wt 180.7 lb

## 2022-07-18 DIAGNOSIS — G893 Neoplasm related pain (acute) (chronic): Secondary | ICD-10-CM

## 2022-07-18 DIAGNOSIS — C9000 Multiple myeloma not having achieved remission: Secondary | ICD-10-CM

## 2022-07-18 DIAGNOSIS — Z09 Encounter for follow-up examination after completed treatment for conditions other than malignant neoplasm: Secondary | ICD-10-CM

## 2022-07-18 DIAGNOSIS — J942 Hemothorax: Secondary | ICD-10-CM

## 2022-07-18 DIAGNOSIS — D630 Anemia in neoplastic disease: Secondary | ICD-10-CM

## 2022-07-18 DIAGNOSIS — D759 Disease of blood and blood-forming organs, unspecified: Secondary | ICD-10-CM

## 2022-07-18 DIAGNOSIS — Z5112 Encounter for antineoplastic immunotherapy: Secondary | ICD-10-CM | POA: Diagnosis not present

## 2022-07-18 DIAGNOSIS — R5381 Other malaise: Secondary | ICD-10-CM

## 2022-07-18 LAB — MULTIPLE MYELOMA PANEL, SERUM
Albumin SerPl Elph-Mcnc: 2.5 g/dL — ABNORMAL LOW (ref 2.9–4.4)
Albumin/Glob SerPl: 0.3 — ABNORMAL LOW (ref 0.7–1.7)
Alpha 1: 0.3 g/dL (ref 0.0–0.4)
Alpha2 Glob SerPl Elph-Mcnc: 0.5 g/dL (ref 0.4–1.0)
B-Globulin SerPl Elph-Mcnc: 7.4 g/dL — ABNORMAL HIGH (ref 0.7–1.3)
Gamma Glob SerPl Elph-Mcnc: 0.3 g/dL — ABNORMAL LOW (ref 0.4–1.8)
Globulin, Total: 8.5 g/dL — ABNORMAL HIGH (ref 2.2–3.9)
IgA: 4897 mg/dL — ABNORMAL HIGH (ref 87–352)
IgG (Immunoglobin G), Serum: 188 mg/dL — ABNORMAL LOW (ref 586–1602)
IgM (Immunoglobulin M), Srm: 15 mg/dL — ABNORMAL LOW (ref 26–217)
M Protein SerPl Elph-Mcnc: 6.8 g/dL — ABNORMAL HIGH
Total Protein ELP: 11 g/dL — ABNORMAL HIGH (ref 6.0–8.5)

## 2022-07-18 LAB — CBC (CANCER CENTER ONLY)
HCT: 23.5 % — ABNORMAL LOW (ref 36.0–46.0)
Hemoglobin: 7.3 g/dL — ABNORMAL LOW (ref 12.0–15.0)
MCH: 28.7 pg (ref 26.0–34.0)
MCHC: 31.1 g/dL (ref 30.0–36.0)
MCV: 92.5 fL (ref 80.0–100.0)
Platelet Count: 71 10*3/uL — ABNORMAL LOW (ref 150–400)
RBC: 2.54 MIL/uL — ABNORMAL LOW (ref 3.87–5.11)
RDW: 18.3 % — ABNORMAL HIGH (ref 11.5–15.5)
WBC Count: 1.9 10*3/uL — ABNORMAL LOW (ref 4.0–10.5)
nRBC: 1.6 % — ABNORMAL HIGH (ref 0.0–0.2)

## 2022-07-18 MED ORDER — MORPHINE SULFATE ER 15 MG PO TBCR: 15.0000 mg | EXTENDED_RELEASE_TABLET | Freq: Two times a day (BID) | ORAL | 0 refills | Status: DC

## 2022-07-18 NOTE — Progress Notes (Unsigned)
[facility]  Visit:  Patient Care Team: Earlyne Iba, NP as PCP - General (Nurse Practitioner) Barbee Cough, MD as Consulting Physician (Internal Medicine)  CHIEF COMPLAINTS/PURPOSE OF CONSULTATION:  Oncology History  Multiple myeloma ([place])  06/20/2022 Initial Diagnosis   Multiple myeloma ([place])   06/29/2022 - 07/13/2022 Chemotherapy   Patient is on Treatment Plan : MULTIPLE MYELOMA CyBorD - Weekly Bortezomib     07/12/2022 Cancer Staging   Staging form: Plasma Cell Myeloma and Plasma Cell Disorders, AJCC 8th Edition - Clinical stage from 07/12/2022: RISS Stage III (Beta-2-microglobulin (mg/L): 17.7, Albumin (g/dL): 2.9, ISS: Stage III, High-risk cytogenetics: Present, LDH: Normal) - Signed by Barbee Cough, MD on 07/13/2022 Histopathologic type: Multiple myeloma Stage prefix: Initial diagnosis Beta 2 microglobulin range (mg/L): Greater than or equal to 5.5 Albumin range (g/dL): Less than 3.5 Cytogenetics: 17p deletion, t(11;14) translocation Lactate dehydrogenase (LDH) (U/L): 133 Serum calcium level: Elevated Pre-operative calcium level (mg/dL): 13.3 Serum creatinine level: Elevated Creatinine (mg/dL): 3.3 Bone disease on imaging: Present Hemoglobin (Hgb) (g/dL): 6.3 Pretreatment IgG (mg/dL): 215 Pretreatment IgA (mg/dL): 6,400 Pretreatment IgM (mg/dL): 13 Pretreatment monoclonal protein level in serum (M spike) (g/dL): 6 Pretreatment monoclonal protein level in 24-hour urine (M spike) (g): 1 Stage used in treatment planning: Yes National guidelines used in treatment planning: Yes   07/20/2022 -  Chemotherapy   Patient is on Treatment Plan : MYELOMA NEWLY DIAGNOSED TRANSPLANT CANDIDATE DaraVRd (Daratumumab SQ) q21d x 6 Cycles (Induction/Consolidation)       HISTORY OF PRESENTING ILLNESS: Kimberly Esparza 56 y.o. female is here because of  multiple myeloma Medical history notable for DHD, GERD, hypothyroidism, osteoarthritis,  fibromyalgia, stomach polyps  June 14, 2022 through June 19 2022: Admitted to [facility] following several weeks of feeling poorly.  She has been to urgent care on several occasions during the weeks prior with complaints of facial pain and headache.  On each occasion she was given antibiotics and on 1 occasion Medrol Dosepak was added.  Patient was able to see PCP this week who drew labs which were noted to be abnormal she was subsequently directed to the emergency room. CT head demonstrated multiple lytic lesions throughout the bony calvarium suggesting neoplastic process.  Largest of these was in the left frontal calvarium.  There is a soft tissue mass extending from this large lytic lesion measuring 3.8 x 3.3 cm in size coarse calcifications extending into the scalp and extra-axial location left frontal region.  INR 1.2 PTT 26.3 WBC 9.1 hemoglobin 6.3 MCV 98 platelet count 73; 54 segs 4 bands 35 lymphs 3 monos 2 eos 2S.  Chemistries notable for sodium 132 creatinine 3.30 glucose 122 calcium 13.3 corrected to 14.4 ionized calcium 2.35 albumin 2.9 total protein 12.2 AST 63 ALT 73  Immunofixation showed a biclonal IgA kappa protein Serum free kappa 3067 lambda 2.3 with a kappa lambda 1333.  IgG 215 IgA >6400 IgM 13 Blood viscosity 5.0  24 hr urine showed loss of 1582 mg protein.  UPEP  showed loss of 1023 mg of paraprotein    Hypercalcemia was treated with dexamethasone, IV hydration, symptomatic and calcitonin  June 15 2022: CT chest abdomen pelvis contrast:  Diffuse moth-eaten appearance of the skeleton with bilateral  pathologic rib fractures of various ages. Expansile 2 cm inferior left scapular lytic lesion. Small 2.0 cm soft tissue lesion in the right paraspinal region at the level of the fourth costovertebral  junction. Findings are compatible with  suspected multiple myeloma.  Small dependent left pleural effusion Trace pericardial effusion.  No lymphadenopathy.   June 16 2022: Port-A-Cath placed by radiology  June 17, 2022: Cardiac echo.  LVEF 60 to 65%.  Right atrium moderately dilated.  Moderate tricuspid valve regurg.  IVC dilated.  Pulmonary artery systolic pressure moderately elevated at 54 mmHg  June 19 2022: Right posterior iliac crest bone marrow biopsy and aspirate Extensive involvement by atypical plasma cells  representing 70% of all cells in the aspirate associated with variably sized aggregates and diffuse sheets in the clot and biopsy sections.  The plasma cells display kappa light chain restriction consistent with plasma cell neoplasm FISH analysis demonstrated a T p53 deletion as well as Cyclin D1/IgH fusion  June 22 2022:  Post hospital follow up for management of multiple myeloma.    Reviewed results of labs and bone marrow biopsy with patient and family.    June 28 2022:  Admitted to [facility] with bilateral pleural effusions, R >> L.  Therapeutic Right thoracocentesis yielded 2.2 liters of bloody pleural fluid with relief of symptoms.  Cytology showed no malignant cells  June 29, 2022: Discharged home so as to begin chemotherapy.  Cycle 1 day 1 CyBorD July 01 2022:  Presented to [facility] with SOB.   CT CAP showed  Large right hemothorax.  Trace left-sided pleural effusion, decreased in the interval.  New patchy ground-glass opacity anteriorly in the left upper lobe with nodular peripheral ground-glass opacity in the posterior left  upper lobe. Imaging features are nonspecific and may be  infectious/inflammatory Bibasilar collapse/consolidation, right greater than left and similar to 06/28/2022..  Numerous lucent lesions throughout the visualized bony anatomy  compatible with the reported clinical history of multiple myeloma.  Multiple bilateral rib fractures of varying age, as before. Superior endplate compression deformity at T9 and T12 is stable.  No acute findings in the abdomen or pelvis.  Right Chest tube  placed with recovery of bloody pleural fluid.  Labs notable for Hgb 4.8.  Patient was transferred to [facility] for acute management  July 02 2022:  Admitted to [facility]  Placed in ICU.  Chest tube removed as bloody output declined.   Transfused with 3 units PRBCs CT Chest without contrast showed  Right-sided chest tube in place with tip terminating in the right upper lobe with surrounding pulmonary hemorrhage. Similar to prior, the side-port terminates outside of the thoracic cavity. This tube is not well positioned to drain the remaining right-sided pleural fluid.  Moderate sized right hydropneumothorax.    July 08 2022:  WBC 2.6 Hgb 8.2 PLT 69; 39 seg 51 lymph 7 mono 2 eos 1 baso.  Cr 2.37   July 11 2022:  Post hospital follow up.  Having considerable back pain.  Also has pain in  right chest which was site of chest tube.  Fatigued.  Not sleeping well due to pain.  Using narcotic analgesics sparingly because she is concerned about developing addiction.  Has oxycodone,  gabapentin, Trazadone for pain.  Asked her to provide a tally of how many oxycodone tablets she has.  She requested considering addition of a muscle relaxer.   Reviewed results of bone marrow, SPEP with IEP, free light chain and FISH results with patient and her family   WBC 2.7 hemoglobin 7.6 platelet count 93; 32 segs 53 lymphs 6 monos 2 eos 1 basophil 6 immature  Chemistry is notable for creatinine 2.89 albumin 1.6 total protein 10.7.  SPEP  with IEP demonstrated an M spike of 6.8 g/dL of paraprotein.  Serum free kappa 4673 lambda 2.8 with a kappa lambda 1453 IgG 188 IgA 4897 IgM 15  July 13 2022:  Cycle 1 Day 8 (delayed)  July 18 2022:  Scheduled follow up for management of multiple myeloma.  Reviewed results of labs with patient, her sister and mother.  She continues to have fatigue and rib pain.  Taking oxycodone sparingly.  Discussed the use of low-dose long-acting MS Contin.  Since last visit I have also contacted  Dr. Enzo Montgomery at [facility] to discuss autotransplant.  We will send records regarding patient.  Patient also having scotomas and blurry vision.  I contacted Dr. Dwana Melena from [facility] to arrange for a follow up.  There is concern that her visual disturbances are related to hyperviscosity from myeloma.  Needing help with disability paperwork.  Requesting home PT.    July 20 2022:  Cycle 1 Bortezomib/ Lenolidomide/Dex/ Dara    Review of Systems  Constitutional:  Positive for fatigue. Negative for chills and fever.       Has gained 4 lbs since last visit  HENT:   Negative for hearing loss, lump/mass, mouth sores, nosebleeds, sore throat, tinnitus, trouble swallowing and voice change.   Eyes:  Positive for eye problems. Negative for icterus.       Vision changes:  None  Respiratory:  Positive for chest tightness. Negative for cough, hemoptysis, shortness of breath and wheezing.        PND:  none Orthopnea:  none Has chest wall pain owing to myeloma   Cardiovascular:  Negative for chest pain, leg swelling and palpitations.       PND:  none Orthopnea:  none  Gastrointestinal:  Positive for constipation. Negative for abdominal distention, abdominal pain, blood in stool, diarrhea, nausea and vomiting.       Constipation improved with treatment of hypecalcemia  Endocrine: Negative for hot flashes.       Cold intolerance:  none Heat intolerance:  none  Genitourinary:  Negative for bladder incontinence, difficulty urinating, dysuria, frequency, hematuria and nocturia.   Musculoskeletal:  Positive for arthralgias and myalgias. Negative for back pain, gait problem, neck pain and neck stiffness.       Arthralgias in shoulders, ribs, back which makes sleeping difficult.  Uses Oxycodone infrequently since it does not provide much relief  Skin:  Negative for itching, rash and wound.  Neurological:  Positive for extremity weakness and headaches. Negative for dizziness, gait problem,  light-headedness, numbness, seizures and speech difficulty.  Hematological:  Negative for adenopathy. Does not bruise/bleed easily.  Psychiatric/Behavioral:  Positive for decreased concentration and sleep disturbance. Negative for confusion and suicidal ideas. The patient is not nervous/anxious.        Thinking foggy despite improvement in hypercalcemia    MEDICAL HISTORY: Past Medical History:  Diagnosis Date   ADHD (attention deficit hyperactivity disorder)    Anxiety    Depression    GERD (gastroesophageal reflux disease)    Hypothyroidism    Multiple allergies    Osteoarthritis     SURGICAL HISTORY: Past Surgical History:  Procedure Laterality Date   ADENOIDECTOMY     BONE MARROW BIOPSY     CHEST TUBE INSERTION     Taken out after 3 days   KNEE SURGERY Bilateral    PARTIAL HYSTERECTOMY     SINOSCOPY     TONSILLECTOMY     TUBAL LIGATION  WRIST SURGERY Bilateral     SOCIAL HISTORY: Social History   Socioeconomic History   Marital status: Married    Spouse name: Carloyn Manner   Number of children: 2   Years of education: 14   Highest education level: Associate degree: academic program  Occupational History   Not on file  Tobacco Use   Smoking status: Never   Smokeless tobacco: Never  Vaping Use   Vaping Use: Never used  Substance and Sexual Activity   Alcohol use: Yes    Comment: rarely   Drug use: Never   Sexual activity: Not Currently    Partners: Male    Comment: Married but no interest  Other Topics Concern   Not on file  Social History Narrative   Not on file   Social Determinants of Health   Financial Resource Strain: Not on file  Food Insecurity: Not on file  Transportation Needs: Not on file  Physical Activity: Not on file  Stress: Not on file  Social Connections: Not on file  Intimate Partner Violence: Not on file    FAMILY HISTORY Family History  Problem Relation Age of Onset   Hypertension Mother    Diabetes Mother    Glaucoma Mother     Gout Mother    Congestive Heart Failure Mother    Allergic rhinitis Father    Thyroid disease Father    Allergic rhinitis Brother    Gout Brother     ALLERGIES:  is allergic to doxycycline and tape.  MEDICATIONS:  Current Outpatient Medications  Medication Sig Dispense Refill   acyclovir (ZOVIRAX) 400 MG tablet Take 1 tablet (400 mg total) by mouth 2 (two) times daily. 60 tablet 3   amoxicillin (AMOXIL) 500 MG tablet TAKE 4 TABLETS PRIOR TO DENTAL PROCEDURE AS DIRECTED.     ASPIRIN 81 PO Take 81 mg by mouth daily.     azelastine (ASTELIN) 0.1 % nasal spray Place 2 sprays into both nostrils 2 (two) times daily as needed for rhinitis. Use in each nostril as directed 30 mL 3   cetirizine (ZYRTEC) 10 MG tablet Take 10 mg by mouth daily.     clonazePAM (KLONOPIN) 0.5 MG tablet Take 1 tablet (0.5 mg total) by mouth 2 (two) times daily as needed for anxiety. 60 tablet 1   dexamethasone (DECADRON) 4 MG tablet Take 4 mg by mouth daily.     dexamethasone (DECADRON) 4 MG tablet Take 8 mg po daily for 2 days after Cytoxan chemotherapy. 30 tablet 3   DEXILANT 60 MG capsule Take 1 capsule by mouth daily.  0   fluticasone (FLONASE) 50 MCG/ACT nasal spray Place 1 spray into the nose daily.     gabapentin (NEURONTIN) 400 MG capsule Take 400 mg by mouth at bedtime.     lenalidomide (REVLIMID) 10 MG capsule Take 1 capsule (10 mg total) by mouth daily. Take for 14 days, then hold for 7 days. Repeat every 21 days. 14 capsule 0   levocetirizine (XYZAL) 5 MG tablet TAKE ONE TABLET BY MOUTH TWICE DAILY AS DIRECTED 60 tablet 0   levothyroxine (SYNTHROID) 88 MCG tablet Take 88 mcg by mouth daily.  3   montelukast (SINGULAIR) 10 MG tablet Take 1 tablet by mouth daily.     ondansetron (ZOFRAN-ODT) 4 MG disintegrating tablet Take 4 mg by mouth every 8 (eight) hours as needed.     oxyCODONE (OXY IR/ROXICODONE) 5 MG immediate release tablet Take by mouth. Take 1 tablet (5 mg total) by  mouth every 4 (four) hours as  needed for Moderate pain (4-6) or Severe pain (7-10).     promethazine (PHENERGAN) 25 MG tablet Take 25 mg by mouth 2 (two) times daily as needed.     scopolamine (TRANSDERM-SCOP) 1 MG/3DAYS Place 1 patch (1.5 mg total) onto the skin every 3 (three) days. 10 patch 12   traZODone (DESYREL) 50 MG tablet Take 1 tablet (50 mg total) by mouth at bedtime. 30 tablet 1   Wheat Dextrin (BENEFIBER PO) Take by mouth.     No current facility-administered medications for this visit.    PHYSICAL EXAMINATION:  ECOG PERFORMANCE STATUS: 2 - Symptomatic, <50% confined to bed   Vitals:   07/18/22 1345  BP: (!) 144/65  Pulse: (!) 101  Resp: 18  Temp: (!) 97.5 F (36.4 C)  SpO2: 100%    Filed Weights   07/18/22 1345  Weight: 180 lb 11.2 oz (82 kg)     Physical Exam Vitals and nursing note reviewed.  Constitutional:      General: She is not in acute distress.    Appearance: Normal appearance. She is obese. She is not toxic-appearing or diaphoretic.     Comments: Here with mother and sister.  Tired and a bit uncomfortable appearing.    HENT:     Head: Normocephalic and atraumatic.     Right Ear: External ear normal.     Left Ear: External ear normal.     Nose: Nose normal. No congestion or rhinorrhea.     Mouth/Throat:     Mouth: Mucous membranes are moist.  Eyes:     General: No scleral icterus.    Extraocular Movements: Extraocular movements intact.     Conjunctiva/sclera: Conjunctivae normal.     Pupils: Pupils are equal, round, and reactive to light.  Cardiovascular:     Rate and Rhythm: Normal rate and regular rhythm.     Heart sounds: Normal heart sounds. No murmur heard.    No friction rub. No gallop.  Pulmonary:     Effort: Pulmonary effort is normal. No respiratory distress.     Breath sounds: Normal breath sounds. No stridor. No wheezing or rales.  Chest:     Chest wall: Tenderness present.  Abdominal:     General: Bowel sounds are normal. There is no distension.      Palpations: Abdomen is soft.     Tenderness: There is no abdominal tenderness. There is no guarding or rebound.  Musculoskeletal:        General: No swelling, tenderness or deformity.     Cervical back: Normal range of motion and neck supple. No rigidity or tenderness.     Right lower leg: No edema.     Left lower leg: No edema.  Lymphadenopathy:     Head:     Right side of head: No submental, submandibular, tonsillar, preauricular, posterior auricular or occipital adenopathy.     Left side of head: No submental, submandibular, tonsillar, preauricular, posterior auricular or occipital adenopathy.     Cervical: No cervical adenopathy.     Right cervical: No superficial, deep or posterior cervical adenopathy.    Left cervical: No superficial, deep or posterior cervical adenopathy.     Upper Body:     Right upper body: No supraclavicular, axillary, pectoral or epitrochlear adenopathy.     Left upper body: No supraclavicular, axillary, pectoral or epitrochlear adenopathy.  Skin:    General: Skin is warm.     Coloration: Skin is  not jaundiced.     Findings: No bruising, erythema or rash.  Neurological:     General: No focal deficit present.     Mental Status: She is alert and oriented to person, place, and time. Mental status is at baseline.     Cranial Nerves: No cranial nerve deficit.     Sensory: No sensory deficit.  Psychiatric:        Mood and Affect: Mood normal.        Behavior: Behavior normal.        Thought Content: Thought content normal.        Judgment: Judgment normal.      LABORATORY DATA: I have personally reviewed the data as listed:  Office Visit on 07/18/2022  Component Date Value Ref Range Status   PT AG Type 07/12/2022    Final                   Value:POSITIVE FOR c ANTIGEN POSITIVE FOR C ANTIGEN NEGATIVE FOR E ANTIGEN POSITIVE FOR e ANTIGEN NEGATIVE FOR KELL ANTIGEN NEGATIVE FOR DUFFY A ANTIGEN POSITIVE FOR DUFFY B ANTIGEN POSITIVE FOR KIDD A ANTIGEN POSITIVE  FOR KIDD B ANTIGEN NEGATIVE FOR S ANTIGEN  POSITIVE FOR s ANTIGEN POSITIVE FOR M ANTIGEN Performed at [facility], [place] [address], [place], [place] [address]   Lab on 07/12/2022  Component Date Value Ref Range Status   ABO/RH(D) 07/12/2022 O POS   Final   Antibody Screen 07/12/2022 POS   Final   Sample Expiration 07/12/2022 07/15/2022,2359   Final   Antibody Identification 17/51/0258 ANTI E   Final   PT AG Type 07/12/2022 NEGATIVE FOR E ANTIGEN   Final   Unit Number 07/12/2022 [id number]   Final   Blood Component Type 07/12/2022 RED CELLS,LR   Final   Unit division 07/12/2022 00   Final   Status of Unit 07/12/2022 ISSUED,FINAL   Final   Donor AG Type 07/12/2022 NEGATIVE FOR E ANTIGEN   Final   Transfusion Status 07/12/2022 OK TO TRANSFUSE   Final   Crossmatch Result 07/12/2022 COMPATIBLE   Final   Order Confirmation 07/12/2022    Final                   Value:ORDER PROCESSED BY BLOOD BANK Performed at [facility], [place] [address], [place], [place] [address]    ISSUE DATE / TIME 07/12/2022 [id number]   Final   Blood Product Unit Number 07/12/2022 [id number]   Final   PRODUCT CODE 07/12/2022 [id number]   Final   Unit Type and Rh 07/12/2022 9500   Final   Blood Product Expiration Date 07/12/2022 [id number]   Final   ABO/RH(D) 07/12/2022    Final                   Value:O POS Performed at [facility], [place] [address] [place], [place] [address]   Appointment on 07/11/2022  Component Date Value Ref Range Status   Sodium 07/11/2022 138  135 - 145 mmol/L Final   Potassium 07/11/2022 4.4  3.5 - 5.1 mmol/L Final   Chloride 07/11/2022 100  98 - 111 mmol/L Final   CO2 07/11/2022 19 (L)  22 - 32 mmol/L Final   Glucose, Bld 07/11/2022 91  70 - 99 mg/dL Final   Glucose reference range applies only to samples taken after fasting for at least 8 hours.   BUN 07/11/2022 31 (H)  6 - 20  mg/dL Final   Creatinine 07/11/2022 2.89 (H)   0.44 - 1.00 mg/dL Final   Calcium 07/11/2022 9.6  8.9 - 10.3 mg/dL Final   Total Protein 07/11/2022 10.7 (H)  6.5 - 8.1 g/dL Final   Albumin 07/11/2022 1.6 (L)  3.5 - 5.0 g/dL Final   AST 07/11/2022 24  15 - 41 U/L Final   ALT 07/11/2022 16  0 - 44 U/L Final   Alkaline Phosphatase 07/11/2022 69  38 - 126 U/L Final   Total Bilirubin 07/11/2022 0.6  0.3 - 1.2 mg/dL Final   GFR, Estimated 07/11/2022 19 (L)  >60 mL/min Final   Comment: (NOTE) Calculated using the CKD-EPI Creatinine Equation (2021)    Anion gap 07/11/2022 19 (H)  5 - 15 Final   Performed at [facility], [place] [address], [place], [place] [address]   WBC Count 07/11/2022 2.7 (L)  4.0 - 10.5 K/uL Final   RBC 07/11/2022 2.72 (L)  3.87 - 5.11 MIL/uL Final   Hemoglobin 07/11/2022 7.6 (L)  12.0 - 15.0 g/dL Final   HCT 07/11/2022 24.7 (L)  36.0 - 46.0 % Final   MCV 07/11/2022 90.8  80.0 - 100.0 fL Final   MCH 07/11/2022 27.9  26.0 - 34.0 pg Final   MCHC 07/11/2022 30.8  30.0 - 36.0 g/dL Final   RDW 07/11/2022 18.6 (H)  11.5 - 15.5 % Final   Platelet Count 07/11/2022 93 (L)  150 - 400 K/uL Final   Comment: SPECIMEN CHECKED FOR CLOTS Immature Platelet Fraction may be clinically indicated, consider ordering this additional test [id number] CONSISTENT WITH PREVIOUS RESULT REPEATED TO VERIFY    nRBC 07/11/2022 0.0  0.0 - 0.2 % Final   Neutrophils Relative % 07/11/2022 32  % Final   Neutro Abs 07/11/2022 0.9 (L)  1.7 - 7.7 K/uL Final   Lymphocytes Relative 07/11/2022 53  % Final   Lymphs Abs 07/11/2022 1.5  0.7 - 4.0 K/uL Final   Monocytes Relative 07/11/2022 6  % Final   Monocytes Absolute 07/11/2022 0.2  0.1 - 1.0 K/uL Final   Eosinophils Relative 07/11/2022 2  % Final   Eosinophils Absolute 07/11/2022 0.0  0.0 - 0.5 K/uL Final   Basophils Relative 07/11/2022 1  % Final   Basophils Absolute 07/11/2022 0.0  0.0 - 0.1 K/uL Final   Immature Granulocytes 07/11/2022 6  % Final   Abs Immature Granulocytes  07/11/2022 0.17 (H)  0.00 - 0.07 K/uL Final   Rouleaux 07/11/2022 PRESENT   Final   Performed at [facility], [place] [address], [place], [place] [address]   Kappa free light chain 07/11/2022 4,067.3 (H)  3.3 - 19.4 mg/L Final   Lambda free light chains 07/11/2022 2.8 (L)  5.7 - 26.3 mg/L Final   Kappa, lambda light chain ratio 07/11/2022 1,452.61 (H)  0.26 - 1.65 Final   Comment: (NOTE) Performed At: [facility] [place], [place] [id number] Rush Farmer MD [id number]   Appointment on 06/22/2022  Component Date Value Ref Range Status   Sodium 06/22/2022 129 (L)  135 - 145 mmol/L Final   Potassium 06/22/2022 3.1 (L)  3.5 - 5.1 mmol/L Final   Chloride 06/22/2022 93 (L)  98 - 111 mmol/L Final   CO2 06/22/2022 23  22 - 32 mmol/L Final   Glucose, Bld 06/22/2022 206 (H)  70 - 99 mg/dL Final   Glucose reference range applies only to samples taken after fasting for at least 8 hours.  BUN 06/22/2022 36 (H)  6 - 20 mg/dL Final   Creatinine 06/22/2022 2.35 (H)  0.44 - 1.00 mg/dL Final   Calcium 06/22/2022 8.7 (L)  8.9 - 10.3 mg/dL Final   Total Protein 06/22/2022 >12.0 (H)  6.5 - 8.1 g/dL Final   RESULTS VERIFIED BY REPEAT TESTING   Albumin 06/22/2022 1.8 (L)  3.5 - 5.0 g/dL Final   AST 06/22/2022 35  15 - 41 U/L Final   ALT 06/22/2022 26  0 - 44 U/L Final   Alkaline Phosphatase 06/22/2022 70  38 - 126 U/L Final   Total Bilirubin 06/22/2022 0.6  0.3 - 1.2 mg/dL Final   GFR, Estimated 06/22/2022 24 (L)  >60 mL/min Final   Comment: (NOTE) Calculated using the CKD-EPI Creatinine Equation (2021)    Anion gap 06/22/2022 13  5 - 15 Final   Performed at [facility], [place] [address], [place], [place] [address]   WBC Count 06/22/2022 8.4  4.0 - 10.5 K/uL Final   RBC 06/22/2022 3.87  3.87 - 5.11 MIL/uL Final   Hemoglobin 06/22/2022 11.0 (L)  12.0 - 15.0 g/dL Final   HCT 06/22/2022 34.0 (L)  36.0 - 46.0 % Final   MCV 06/22/2022 87.9   80.0 - 100.0 fL Final   MCH 06/22/2022 28.4  26.0 - 34.0 pg Final   MCHC 06/22/2022 32.4  30.0 - 36.0 g/dL Final   RDW 06/22/2022 19.3 (H)  11.5 - 15.5 % Final   Platelet Count 06/22/2022 73 (L)  150 - 400 K/uL Final   Comment: SPECIMEN CHECKED FOR CLOTS Immature Platelet Fraction may be clinically indicated, consider ordering this additional test [id number] REPEATED TO VERIFY PLATELET COUNT CONFIRMED BY SMEAR    nRBC 06/22/2022 0.8 (H)  0.0 - 0.2 % Final   Neutrophils Relative % 06/22/2022 51  % Final   Neutro Abs 06/22/2022 4.3  1.7 - 7.7 K/uL Final   Lymphocytes Relative 06/22/2022 28  % Final   Lymphs Abs 06/22/2022 2.4  0.7 - 4.0 K/uL Final   Monocytes Relative 06/22/2022 4  % Final   Monocytes Absolute 06/22/2022 0.4  0.1 - 1.0 K/uL Final   Eosinophils Relative 06/22/2022 1  % Final   Eosinophils Absolute 06/22/2022 0.1  0.0 - 0.5 K/uL Final   Basophils Relative 06/22/2022 1  % Final   Basophils Absolute 06/22/2022 0.1  0.0 - 0.1 K/uL Final   WBC Morphology 06/22/2022 MILD LEFT SHIFT (1-5% METAS, OCC MYELO, OCC BANDS)   Final   Immature Granulocytes 06/22/2022 15  % Final   Abs Immature Granulocytes 06/22/2022 1.22 (H)  0.00 - 0.07 K/uL Final   Rouleaux 06/22/2022 PRESENT   Final   Performed at [facility], [place] [address], [place], [place] [address]  Office Visit on 06/22/2022  Component Date Value Ref Range Status   Total Protein ELP 06/22/2022 LA11  g/dL Final   Comment: (NOTE) Test not performed. Deterioration occurred during specimen handling.      Raelyn Ensign was notified 07/07/2022    IgG (Immunoglobin G), Serum 06/22/2022 NOT PERFORMED  mg/dL Final   Test not performed   IgA 06/22/2022 NOT PERFORMED  mg/dL Final   Test not performed   IgM (Immunoglobulin M), Srm 06/22/2022 NOT PERFORMED  mg/dL Final   Comment: (NOTE) Test not performed Performed At: [facility] [place], [place] [id number] Rush Farmer MD  EP:[id number]    Immunofixation Result, Serum 06/22/2022 NOT PERFORMED   Corrected  Test not performed   Beta-2 Microglobulin 06/22/2022 17.7 (H)  0.6 - 2.4 mg/L Final   Comment: (NOTE) Siemens Immulite 2000 Immunochemiluminometric assay (ICMA) Values obtained with different assay methods or kits cannot be used interchangeably. Results cannot be interpreted as absolute evidence of the presence or absence of malignant disease. Performed At: [facility] [place], [place] [id number] Rush Farmer MD DJ:[id number]   Hospital Outpatient Visit on 06/19/2022  Component Date Value Ref Range Status   SURGICAL PATHOLOGY 06/19/2022    Final-Edited                   Value:Surgical Pathology CASE: [id number] PATIENT: Zakari Ketchem Bone Marrow Report     Clinical History: Multiple Myeloma not having achieved remission  ([place])     DIAGNOSIS:  BONE MARROW, ASPIRATE, CLOT, CORE: -Hypercellular bone marrow with plasma cell neoplasm -See comment  PERIPHERAL BLOOD: -Normocytic-normochromic anemia -Neutrophilic left shift -Thrombocytopenia  COMMENT:  The bone marrow shows extensive involvement by atypical plasma cells representing 70% of all cells in the aspirate associated with variably sized aggregates and diffuse sheets in the clot and biopsy sections. The plasma cells display kappa light chain restriction consistent with plasma cell neoplasm.  Correlation with cytogenetic and FISH studies is recommended.  MICROSCOPIC DESCRIPTION:  PERIPHERAL BLOOD SMEAR: The red blood cells display mild anisopoikilocytosis with mild polychromasia and prominent rouleaux formation.  The white blood cells are normal in number with scattered                          neutrophils displaying mild toxic granulation.  This is associated with neutrophilic left shift.  The platelets are decreased in number.  BONE MARROW ASPIRATE: Bone marrow particles present with  prominent degenerative cellular changes. Erythroid precursors: Relatively decreased but with progressive maturation.  Scattered late precursors display nuclear cytoplasmic dyssynchrony or irregular nuclei. Granulocytic precursors: Relatively decreased but with progressive maturation. Megakaryocytes: Appear decreased with scattered forms present Lymphocytes/plasma cells: The plasma cells are prominently increased in number representing 70% of all cells associated with atypical cytomorphologic features characterized by cytomegaly and/or small nucleoli.  Large lymphoid aggregates are not seen.  TOUCH PREPARATIONS: A mixture of cell types but with relative abundance of atypical plasma cells  CLOT AND BIOPSY: The sections show 80 to 100% cellularity with extensive replacement o                         f the medullary space by diffuse sheets and variably sized aggregates of atypical plasma cells characterized by partially clumped to vesicular chormaltin and small nucleoli.  Myeloid hematopoiesis is decreased with small residual foci present.  Immunohistochemical stain for CD138 and in situ hybridization for kappa and lambda were performed on blocks B1 and C1 with appropriate controls.  CD138 highlights the extensive plasma cell component in the marrow which displays kappa light chain restriction.  IRON STAIN: Iron stains are performed on a bone marrow aspirate or touch imprint smear and section of clot. The controls stained appropriately.       Storage Iron: Present      Ring Sideroblasts: Absent  ADDITIONAL DATA/TESTING: The bone marrow specimen was sent for cytogenetic and analysis and FISH for multiple myeloma and a separate report will follow.  CELL COUNT DATA:  Bone Marrow count performed on 500 cells shows: Blasts:   0%   Myeloid:  22% Promyelocytes: 0%  Erythroid:     5% Myelocytes:    1%   Lymphocytes:   2% Metamyelocytes:     0%   Plasma cells:   70% Bands:    1% Neutrophils:   18%  M:E ratio:     4.4 Eosinophils:   2% Basophils:     0% Monocytes:     1%  Lab Data: CBC performed on 06/20/2022 shows: WBC: 6.2 k/uL  Neutrophils:   61%  Myelocytes:           1% Hgb: 7.0 g/dL  Lymphocytes:   30%  Bands:                   2% HCT: 19.4 %    Monocytes:     6% MCV: 87 fL     Eosinophils:   0% RDW: 17.1 %    Basophils:     0% PLT: 57 k/uL    GROSS DESCRIPTION:  A: Aspirate smear  B: Received in B-plus fixative are tissue fragments measuring 0.5 x 0.2 x 0.1 cm in aggregate.  The specimen is submitted in toto.  C: Received in B-plus fixative is a 0.9 x 0.2 cm core of bone.  The specimen is submitted in toto following decalcification.  [facility] 06/19/2022)   Final Diagnosis performed by Susanne Greenhouse, MD.   Electronically signed 06/21/2022 Technical and / or Professional components performed at [facility], [place] [address], [place], [place] [address].  Immunohistochemistry Technical component (if applicable) was performed at [facility] [address] [place], [place], [place] [address].   IMMUNOHISTOCHEMISTRY DISCLAIMER (if applicable): Some of these immunohistochemical stains may have been developed and the performance characteristics determine by [facility]. Some may not have been cleared or approved by the U.S. Food and Drug Administration. The FDA has determined that such clearance or approval is not necessary. This test is used for clinical purposes. It should not be regarded as investigational or for research. This laboratory is certified under the [place] (CLIA-88) as qualified to perform high complexity clinical laboratory testing.  The controls stained appropriately.     RADIOGRAPHIC STUDIES: I have personally reviewed the radiological images as listed and agree with the findings in the  report  No results found.  ASSESSMENT/PLAN 56 year old female who on June 14 2022 presented with acute renal failure, hypercalcemia, elevated total protein in setting of hypoalbuminemia, cytopenias (anemia, thrombocytopenia), lytic bone lesions which has lead to the diagnosis of multiple myeloma.    Multiple myeloma ISS-Stage III, Biclonal IgA kappa + kappa light chain escape/ FISH with TP 53 deletion and Cyclin D1/IgH fusion:  Diagnosed in October 2023 after patient presented with hypercalcemia, AKI, wide spread lytic lesions in skeleton, cytopenias.  At presentation serum free kappa 3067 lambda 2.3 with a kappa lambda 1333. IgA >6400 Blood viscosity 5.0  24 hr urine showed loss of 1582 mg protein.  UPEP  showed loss of 1023 mg of paraprotein.   BM Bx showed extensive involvement by plasma cells with kappa restriction.    Therapeutics:  Patient has high risk disease and is a stem cell transplant candidate  June 29, 2022: Cycle 1 Day 1 CyBorD.  Initiation of chemotherapy delayed due to insurance reasons.  Cycle 1 Day 8 interrupted because of hemothorax July 13 2022: Cycle 1 Day 8 CyBorD.  Labs notable for improvement in IgA M spike but increased kappa light chains.   July 20 2022:  Cycle 1 Vrd + Dara. Revlimid dose 10 mg daily Days 1 to 14 due to decreased renal function.  Will observe closely for side effects from Revlimid due to decreased renal fxn    Hypercalcemia:  Secondary to myeloma.  Was treated with IVF, calcitonin, Dexamethasone, Zometa. Should improve with continued therapy of myeloma.  Will need Zometa with chemotherapy.      Pulmonary HTN:  Has been reported in Multiple Myeloma.  Multifactorial can be related to caused by pulmonary vasculopathy, endothelial dysfunction, pulmonary vascular amyloidosis, hyperviscosity.  Noted to have hyperviscosity on labs drawn during the hospitalization.  Not hypoxemic so doubt PE  Cancer related pain:  Due to bone lesions from  myeloma.  Will improve with treatment.   July 18 2022:  Adding MS Contin 15 mg po bid.       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  Cytology from pleural fluid has been negative for plasma cellsUnderwent chest tube placement  Follow up CXR improved  Anemia:  Multifactorial.  Secondary to myeloma, renal insufficiency and bleeding (hemothorax)  Will follow Hgb and transfuse as needed for Hgb < 8 g/dL.  Phenotyping performed in preparation for starting daratumumab   Visual disturbances:  Likely due to hyperviscosity from myeloma.  Arranging for ophthalmology follow up  Deconditioning:  Arrange for PT by home health.  Patient is unable to leave home except for medical visits.    Social work consult:  To assist with disability paperwork    Cancer Staging  Multiple myeloma [facility]) Staging form: Plasma Cell Myeloma and Plasma Cell Disorders, AJCC 8th Edition - Clinical stage from 07/12/2022: RISS Stage III (Beta-2-microglobulin (mg/L): 17.7, Albumin (g/dL): 2.9, ISS: Stage III, High-risk cytogenetics: Present, LDH: Normal) - Signed by Barbee Cough, MD on 07/13/2022 Histopathologic type: Multiple myeloma Stage prefix: Initial diagnosis Beta 2 microglobulin range (mg/L): Greater than or equal to 5.5 Albumin range (g/dL): Less than 3.5 Cytogenetics: 17p deletion, t(11;14) translocation Lactate dehydrogenase (LDH) (U/L): 133 Serum calcium level: Elevated Pre-operative calcium level (mg/dL): 13.3 Serum creatinine level: Elevated Creatinine (mg/dL): 3.3 Bone disease on imaging: Present Hemoglobin (Hgb) (g/dL): 6.3 Pretreatment IgG (mg/dL): 215 Pretreatment IgA (mg/dL): 6,400 Pretreatment IgM (mg/dL): 13 Pretreatment monoclonal protein level in serum (M spike) (g/dL): 6 Pretreatment monoclonal protein level in 24-hour urine  (M spike) (g): 1 Stage used in treatment planning: Yes National guidelines used in treatment planning: Yes   No problem-specific Assessment & Plan notes found for this encounter.   Orders Placed This Encounter  Procedures   Pretreatment RBC phenotype    Order Specific Question:   Medication to be given:    Answer:   Daratumumab   Type and screen    Obtain prior to administration of daratumumab    Standing Status:   Future    Number of Occurrences:   1    Standing Expiration Date:   07/12/2023     All questions were answered. The patient knows to call the clinic with any problems, questions or concerns.  This note was electronically signed.    Barbee Cough, MD  07/18/2022 1:59 PM

## 2022-07-19 ENCOUNTER — Other Ambulatory Visit: Payer: Self-pay

## 2022-07-19 DIAGNOSIS — C9000 Multiple myeloma not having achieved remission: Secondary | ICD-10-CM

## 2022-07-19 DIAGNOSIS — D630 Anemia in neoplastic disease: Secondary | ICD-10-CM

## 2022-07-19 DIAGNOSIS — Z5112 Encounter for antineoplastic immunotherapy: Secondary | ICD-10-CM | POA: Diagnosis not present

## 2022-07-19 DIAGNOSIS — D649 Anemia, unspecified: Secondary | ICD-10-CM

## 2022-07-19 LAB — PREPARE RBC (CROSSMATCH)

## 2022-07-19 MED FILL — Bortezomib For Inj 3.5 MG: INTRAMUSCULAR | Qty: 1 | Status: AC

## 2022-07-19 MED FILL — Daratumumab-Hyaluronidase-fihj Inj 1800-30000 MG-Unit/15ML: SUBCUTANEOUS | Qty: 15 | Status: AC

## 2022-07-20 ENCOUNTER — Other Ambulatory Visit: Payer: Self-pay | Admitting: Pharmacist

## 2022-07-20 ENCOUNTER — Encounter: Payer: Self-pay | Admitting: Oncology

## 2022-07-20 ENCOUNTER — Inpatient Hospital Stay: Payer: 59

## 2022-07-20 ENCOUNTER — Telehealth: Payer: Self-pay

## 2022-07-20 ENCOUNTER — Ambulatory Visit: Payer: 59

## 2022-07-20 VITALS — BP 127/58 | HR 103 | Temp 98.0°F | Resp 16 | Wt 181.0 lb

## 2022-07-20 DIAGNOSIS — D630 Anemia in neoplastic disease: Secondary | ICD-10-CM

## 2022-07-20 DIAGNOSIS — Z5112 Encounter for antineoplastic immunotherapy: Secondary | ICD-10-CM | POA: Diagnosis not present

## 2022-07-20 DIAGNOSIS — C9000 Multiple myeloma not having achieved remission: Secondary | ICD-10-CM

## 2022-07-20 DIAGNOSIS — R5381 Other malaise: Secondary | ICD-10-CM | POA: Insufficient documentation

## 2022-07-20 DIAGNOSIS — D759 Disease of blood and blood-forming organs, unspecified: Secondary | ICD-10-CM | POA: Insufficient documentation

## 2022-07-20 DIAGNOSIS — D649 Anemia, unspecified: Secondary | ICD-10-CM

## 2022-07-20 LAB — CBC WITH DIFFERENTIAL (CANCER CENTER ONLY)
Abs Immature Granulocytes: 0.13 10*3/uL — ABNORMAL HIGH (ref 0.00–0.07)
Basophils Absolute: 0 10*3/uL (ref 0.0–0.1)
Basophils Relative: 1 %
Eosinophils Absolute: 0.1 10*3/uL (ref 0.0–0.5)
Eosinophils Relative: 3 %
HCT: 22.2 % — ABNORMAL LOW (ref 36.0–46.0)
Hemoglobin: 6.7 g/dL — CL (ref 12.0–15.0)
Immature Granulocytes: 7 %
Lymphocytes Relative: 49 %
Lymphs Abs: 1 10*3/uL (ref 0.7–4.0)
MCH: 27.8 pg (ref 26.0–34.0)
MCHC: 30.2 g/dL (ref 30.0–36.0)
MCV: 92.1 fL (ref 80.0–100.0)
Monocytes Absolute: 0.2 10*3/uL (ref 0.1–1.0)
Monocytes Relative: 8 %
Neutro Abs: 0.6 10*3/uL — ABNORMAL LOW (ref 1.7–7.7)
Neutrophils Relative %: 32 %
Platelet Count: 66 10*3/uL — ABNORMAL LOW (ref 150–400)
RBC: 2.41 MIL/uL — ABNORMAL LOW (ref 3.87–5.11)
RDW: 18.1 % — ABNORMAL HIGH (ref 11.5–15.5)
WBC Count: 2 10*3/uL — ABNORMAL LOW (ref 4.0–10.5)
nRBC: 1 % — ABNORMAL HIGH (ref 0.0–0.2)

## 2022-07-20 MED ORDER — SODIUM CHLORIDE 0.9% FLUSH
10.0000 mL | INTRAVENOUS | Status: AC | PRN
  Administered 2022-07-20: 10 mL

## 2022-07-20 MED ORDER — BORTEZOMIB CHEMO SQ INJECTION 3.5 MG (2.5MG/ML)
1.3000 mg/m2 | Freq: Once | INTRAMUSCULAR | Status: AC
  Administered 2022-07-20: 2.5 mg via SUBCUTANEOUS
  Filled 2022-07-20 (×2): qty 1

## 2022-07-20 MED ORDER — SODIUM CHLORIDE 0.9% FLUSH: 3.0000 mL | INTRAVENOUS | Status: DC | PRN

## 2022-07-20 MED ORDER — MONTELUKAST SODIUM 10 MG PO TABS
10.0000 mg | ORAL_TABLET | Freq: Once | ORAL | Status: AC
  Administered 2022-07-20: 10 mg via ORAL
  Filled 2022-07-20: qty 1

## 2022-07-20 MED ORDER — DIPHENHYDRAMINE HCL 25 MG PO CAPS
25.0000 mg | ORAL_CAPSULE | Freq: Once | ORAL | Status: AC
  Administered 2022-07-20: 25 mg via ORAL
  Filled 2022-07-20: qty 1

## 2022-07-20 MED ORDER — ACETAMINOPHEN 325 MG PO TABS: 650.0000 mg | ORAL_TABLET | Freq: Once | ORAL | Status: DC

## 2022-07-20 MED ORDER — HEPARIN SOD (PORK) LOCK FLUSH 100 UNIT/ML IV SOLN
250.0000 [IU] | INTRAVENOUS | Status: AC | PRN
  Administered 2022-07-20: 250 [IU]

## 2022-07-20 MED ORDER — DENOSUMAB 120 MG/1.7ML ~~LOC~~ SOLN
120.0000 mg | Freq: Once | SUBCUTANEOUS | Status: AC
  Administered 2022-07-20: 120 mg via SUBCUTANEOUS
  Filled 2022-07-20: qty 1.7

## 2022-07-20 MED ORDER — DEXAMETHASONE 4 MG PO TABS
20.0000 mg | ORAL_TABLET | Freq: Once | ORAL | Status: AC
  Administered 2022-07-20: 20 mg via ORAL
  Filled 2022-07-20: qty 5

## 2022-07-20 MED ORDER — SODIUM CHLORIDE 0.9% IV SOLUTION
250.0000 mL | Freq: Once | INTRAVENOUS | Status: AC
  Administered 2022-07-20: 250 mL via INTRAVENOUS

## 2022-07-20 MED ORDER — DIPHENHYDRAMINE HCL 25 MG PO CAPS
50.0000 mg | ORAL_CAPSULE | Freq: Once | ORAL | Status: AC
  Administered 2022-07-20: 25 mg via ORAL

## 2022-07-20 MED ORDER — ACETAMINOPHEN 325 MG PO TABS
650.0000 mg | ORAL_TABLET | Freq: Once | ORAL | Status: AC
  Filled 2022-07-20: qty 2

## 2022-07-20 MED ORDER — ACETAMINOPHEN 325 MG PO TABS
650.0000 mg | ORAL_TABLET | Freq: Once | ORAL | Status: AC
  Administered 2022-07-20: 650 mg via ORAL

## 2022-07-20 MED ORDER — DARATUMUMAB-HYALURONIDASE-FIHJ 1800-30000 MG-UT/15ML ~~LOC~~ SOLN
1800.0000 mg | Freq: Once | SUBCUTANEOUS | Status: AC
  Administered 2022-07-20: 1800 mg via SUBCUTANEOUS
  Filled 2022-07-20 (×2): qty 15

## 2022-07-20 MED ORDER — DIPHENHYDRAMINE HCL 25 MG PO CAPS
25.0000 mg | ORAL_CAPSULE | Freq: Once | ORAL | Status: DC
  Filled 2022-07-20: qty 1

## 2022-07-20 NOTE — Patient Instructions (Signed)
Bortezomib Injection What is this medication? BORTEZOMIB (bor TEZ oh mib) treats lymphoma. It may also be used to treat multiple myeloma, a type of bone marrow cancer. It works by blocking a protein that causes cancer cells to grow and multiply. This helps to slow or stop the spread of cancer cells. This medicine may be used for other purposes; ask your health care provider or pharmacist if you have questions. COMMON BRAND NAME(S): Velcade What should I tell my care team before I take this medication? They need to know if you have any of these conditions: Dehydration Diabetes Heart disease Liver disease Tingling of the fingers or toes or other nerve disorder An unusual or allergic reaction to bortezomib, other medications, foods, dyes, or preservatives If you or your partner are pregnant or trying to get pregnant Breastfeeding How should I use this medication? This medication is injected into a vein or under the skin. It is given by your care team in a hospital or clinic setting. Talk to your care team about the use of this medication in children. Special care may be needed. Overdosage: If you think you have taken too much of this medicine contact a poison control center or emergency room at once. NOTE: This medicine is only for you. Do not share this medicine with others. What if I miss a dose? Keep appointments for follow-up doses. It is important not to miss your dose. Call your care team if you are unable to keep an appointment. What may interact with this medication? Ketoconazole Rifampin This list may not describe all possible interactions. Give your health care provider a list of all the medicines, herbs, non-prescription drugs, or dietary supplements you use. Also tell them if you smoke, drink alcohol, or use illegal drugs. Some items may interact with your medicine. What should I watch for while using this medication? Your condition will be monitored carefully while you are  receiving this medication. You may need blood work while taking this medication. This medication may affect your coordination, reaction time, or judgment. Do not drive or operate machinery until you know how this medication affects you. Sit up or stand slowly to reduce the risk of dizzy or fainting spells. Drinking alcohol with this medication can increase the risk of these side effects. This medication may increase your risk of getting an infection. Call your care team for advice if you get a fever, chills, sore throat, or other symptoms of a cold or flu. Do not treat yourself. Try to avoid being around people who are sick. Check with your care team if you have severe diarrhea, nausea, and vomiting, or if you sweat a lot. The loss of too much body fluid may make it dangerous for you to take this medication. Talk to your care team if you may be pregnant. Serious birth defects can occur if you take this medication during pregnancy and for 7 months after the last dose. You will need a negative pregnancy test before starting this medication. Contraception is recommended while taking this medication and for 7 months after the last dose. Your care team can help you find the option that works for you. If your partner can get pregnant, use a condom during sex while taking this medication and for 4 months after the last dose. Do not breastfeed while taking this medication and for 2 months after the last dose. This medication may cause infertility. Talk to your care team if you are concerned about your fertility. What side effects   may I notice from receiving this medication? Side effects that you should report to your care team as soon as possible: Allergic reactions--skin rash, itching, hives, swelling of the face, lips, tongue, or throat Bleeding--bloody or black, tar-like stools, vomiting blood or brown material that looks like coffee grounds, red or dark brown urine, small red or purple spots on skin, unusual  bruising or bleeding Bleeding in the brain--severe headache, stiff neck, confusion, dizziness, change in vision, numbness or weakness of the face, arm, or leg, trouble speaking, trouble walking, vomiting Bowel blockage--stomach cramping, unable to have a bowel movement or pass gas, loss of appetite, vomiting Heart failure--shortness of breath, swelling of the ankles, feet, or hands, sudden weight gain, unusual weakness or fatigue Infection--fever, chills, cough, sore throat, wounds that don't heal, pain or trouble when passing urine, general feeling of discomfort or being unwell Liver injury--right upper belly pain, loss of appetite, nausea, light-colored stool, dark yellow or brown urine, yellowing skin or eyes, unusual weakness or fatigue Low blood pressure--dizziness, feeling faint or lightheaded, blurry vision Lung injury--shortness of breath or trouble breathing, cough, spitting up blood, chest pain, fever Pain, tingling, or numbness in the hands or feet Severe or prolonged diarrhea Stomach pain, bloody diarrhea, pale skin, unusual weakness or fatigue, decrease in the amount of urine, which may be signs of hemolytic uremic syndrome Sudden and severe headache, confusion, change in vision, seizures, which may be signs of posterior reversible encephalopathy syndrome (PRES) TTP--purple spots on the skin or inside the mouth, pale skin, yellowing skin or eyes, unusual weakness or fatigue, fever, fast or irregular heartbeat, confusion, change in vision, trouble speaking, trouble walking Tumor lysis syndrome (TLS)--nausea, vomiting, diarrhea, decrease in the amount of urine, dark urine, unusual weakness or fatigue, confusion, muscle pain or cramps, fast or irregular heartbeat, joint pain Side effects that usually do not require medical attention (report to your care team if they continue or are bothersome): Constipation Diarrhea Fatigue Loss of appetite Nausea This list may not describe all possible  side effects. Call your doctor for medical advice about side effects. You may report side effects to FDA at 1-800-FDA-1088. Where should I keep my medication? This medication is given in a hospital or clinic. It will not be stored at home. NOTE: This sheet is a summary. It may not cover all possible information. If you have questions about this medicine, talk to your doctor, pharmacist, or health care provider.  2023 Elsevier/Gold Standard (2022-01-18 00:00:00) Daratumumab Injection What is this medication? DARATUMUMAB (dar a toom ue mab) treats multiple myeloma, a type of bone marrow cancer. It works by helping your immune system slow or stop the spread of cancer cells. It is a monoclonal antibody. This medicine may be used for other purposes; ask your health care provider or pharmacist if you have questions. COMMON BRAND NAME(S): DARZALEX What should I tell my care team before I take this medication? They need to know if you have any of these conditions: Hereditary fructose intolerance Infection, such as chickenpox, herpes, hepatitis B virus Lung or breathing disease, such as asthma, COPD An unusual or allergic reaction to daratumumab, sorbitol, other medications, foods, dyes, or preservatives Pregnant or trying to get pregnant Breast-feeding How should I use this medication? This medication is injected into a vein. It is given by your care team in a hospital or clinic setting. Talk to your care team about the use of this medication in children. Special care may be needed. Overdosage: If you think you  have taken too much of this medicine contact a poison control center or emergency room at once. NOTE: This medicine is only for you. Do not share this medicine with others. What if I miss a dose? Keep appointments for follow-up doses. It is important not to miss your dose. Call your care team if you are unable to keep an appointment. What may interact with this medication? Interactions have  not been studied. This list may not describe all possible interactions. Give your health care provider a list of all the medicines, herbs, non-prescription drugs, or dietary supplements you use. Also tell them if you smoke, drink alcohol, or use illegal drugs. Some items may interact with your medicine. What should I watch for while using this medication? Your condition will be monitored carefully while you are receiving this medication. This medication can cause serious allergic reactions. To reduce your risk, your care team may give you other medication to take before receiving this one. Be sure to follow the directions from your care team. This medication can affect the results of blood tests to match your blood type. These changes can last for up to 6 months after the final dose. Your care team will do blood tests to match your blood type before you start treatment. Tell all of your care team that you are being treated with this medication before receiving a blood transfusion. This medication can affect the results of some tests used to determine treatment response; extra tests may be needed to evaluate response. Talk to your care team if you wish to become pregnant or think you are pregnant. This medication can cause serious birth defects if taken during pregnancy and for 3 months after the last dose. A reliable form of contraception is recommended while taking this medication and for 3 months after the last dose. Talk to your care team about effective forms of contraception. Do not breast-feed while taking this medication. What side effects may I notice from receiving this medication? Side effects that you should report to your care team as soon as possible: Allergic reactions--skin rash, itching, hives, swelling of the face, lips, tongue, or throat Infection--fever, chills, cough, sore throat, wounds that don't heal, pain or trouble when passing urine, general feeling of discomfort or being  unwell Infusion reactions--chest pain, shortness of breath or trouble breathing, feeling faint or lightheaded Unusual bruising or bleeding Side effects that usually do not require medical attention (report to your care team if they continue or are bothersome): Constipation Diarrhea Fatigue Nausea Pain, tingling, or numbness in the hands or feet Swelling of the ankles, hands, or feet This list may not describe all possible side effects. Call your doctor for medical advice about side effects. You may report side effects to FDA at 1-800-FDA-1088. Where should I keep my medication? This medication is given in a hospital or clinic. It will not be stored at home. NOTE: This sheet is a summary. It may not cover all possible information. If you have questions about this medicine, talk to your doctor, pharmacist, or health care provider.  2023 Elsevier/Gold Standard (2021-12-14 00:00:00) Blood Transfusion, Adult, Care After After a blood transfusion, it is common to have: Bruising and soreness at the IV site. A headache. Follow these instructions at home: Your doctor may give you more instructions. If you have problems, contact your doctor. Insertion site care     Follow instructions from your doctor about how to take care of your insertion site. This is where an IV  tube was put into your vein. Make sure you: Wash your hands with soap and water for at least 20 seconds before and after you change your bandage. If you cannot use soap and water, use hand sanitizer. Change your bandage as told by your doctor. Check your insertion site every day for signs of infection. Check for: Redness, swelling, or pain. Bleeding from the site. Warmth. Pus or a bad smell. General instructions Take over-the-counter and prescription medicines only as told by your doctor. Rest as told by your doctor. Go back to your normal activities as told by your doctor. Keep all follow-up visits. You may need to have  tests at certain times to check your blood. Contact a doctor if: You have itching or red, swollen areas of skin (hives). You have a fever or chills. You have pain in the head, back, or chest. You feel worried or nervous (anxious). You feel weak after doing your normal activities. You have any of these problems at the insertion site: Redness, swelling, warmth, or pain. Bleeding that does not stop with pressure. Pus or a bad smell. If you received your blood transfusion in an outpatient setting, you will be told whom to contact to report any reactions. Get help right away if: You have signs of a serious reaction. This may be coming from an allergy or the body's defense system (immune system). Signs include: Trouble breathing or shortness of breath. Swelling of the face or feeling warm (flushed). A widespread rash. Dark pee (urine) or blood in the pee. Fast heartbeat. These symptoms may be an emergency. Get help right away. Call 911. Do not wait to see if the symptoms will go away. Do not drive yourself to the hospital. Summary Bruising and soreness at the IV site are common. Check your insertion site every day for signs of infection. Rest as told by your doctor. Go back to your normal activities as told by your doctor. Get help right away if you have signs of a serious reaction. This information is not intended to replace advice given to you by your health care provider. Make sure you discuss any questions you have with your health care provider. Document Revised: 11/18/2021 Document Reviewed: 11/18/2021 Elsevier Patient Education  [place].

## 2022-07-20 NOTE — Progress Notes (Signed)
Discharged home- stable- patient states understanding that she is to have labs drawn at 1030 in the am

## 2022-07-20 NOTE — Telephone Encounter (Signed)
Message left from [place] at [facility]: Patient has been scheduled to be seen with Dr. Bernarda Caffey on August 14, 2022 at 11:00 am.

## 2022-07-20 NOTE — Progress Notes (Signed)
Ok to proceed with chemo today despite scr=3.9, hgb=7.3 and plt=71 per Dr. Federico Flake.

## 2022-07-20 NOTE — Progress Notes (Signed)
CRITICAL VALUE STICKER  CRITICAL VALUE:  Hgb 6.7 plt 66.  RECEIVER (on-site recipient of call):  Landan Fedie D., RN  DATE & TIME NOTIFIED: .  07/20/2022 @ 1411  MESSENGER (representative from lab):  Alexis @ [facility]  MD NOTIFIED:   Dr. Federico Flake  TIME OF NOTIFICATION:   1430  RESPONSE:   Pt received one unit this morning after labs, recheck labs on 07/21/2022 and transfuse on Monday 07/24/2022 if necessary.

## 2022-07-20 NOTE — Progress Notes (Signed)
0840 Labs drawn and sent with [facility]

## 2022-07-21 ENCOUNTER — Other Ambulatory Visit: Payer: Self-pay | Admitting: Hematology and Oncology

## 2022-07-21 ENCOUNTER — Inpatient Hospital Stay: Payer: 59 | Admitting: Oncology

## 2022-07-21 DIAGNOSIS — C9000 Multiple myeloma not having achieved remission: Secondary | ICD-10-CM

## 2022-07-21 DIAGNOSIS — Z5112 Encounter for antineoplastic immunotherapy: Secondary | ICD-10-CM | POA: Diagnosis not present

## 2022-07-21 LAB — TYPE AND SCREEN
ABO/RH(D): O POS
Antibody Screen: POSITIVE
Donor AG Type: NEGATIVE
Unit division: 0

## 2022-07-21 LAB — CBC WITH DIFFERENTIAL (CANCER CENTER ONLY)
Abs Immature Granulocytes: 0.04 10*3/uL (ref 0.00–0.07)
Basophils Absolute: 0 10*3/uL (ref 0.0–0.1)
Basophils Relative: 0 %
Eosinophils Absolute: 0 10*3/uL (ref 0.0–0.5)
Eosinophils Relative: 0 %
HCT: 20.4 % — ABNORMAL LOW (ref 36.0–46.0)
Hemoglobin: 6.4 g/dL — CL (ref 12.0–15.0)
Immature Granulocytes: 2 %
Lymphocytes Relative: 28 %
Lymphs Abs: 0.5 10*3/uL — ABNORMAL LOW (ref 0.7–4.0)
MCH: 27.9 pg (ref 26.0–34.0)
MCHC: 31.4 g/dL (ref 30.0–36.0)
MCV: 89.1 fL (ref 80.0–100.0)
Monocytes Absolute: 0.1 10*3/uL (ref 0.1–1.0)
Monocytes Relative: 4 %
Neutro Abs: 1.1 10*3/uL — ABNORMAL LOW (ref 1.7–7.7)
Neutrophils Relative %: 66 %
Platelet Count: 62 10*3/uL — ABNORMAL LOW (ref 150–400)
RBC: 2.29 MIL/uL — ABNORMAL LOW (ref 3.87–5.11)
RDW: 18.1 % — ABNORMAL HIGH (ref 11.5–15.5)
WBC Count: 1.7 10*3/uL — ABNORMAL LOW (ref 4.0–10.5)
nRBC: 1.8 % — ABNORMAL HIGH (ref 0.0–0.2)

## 2022-07-21 LAB — BPAM RBC
Blood Product Expiration Date: 202312162359
ISSUE DATE / TIME: 202311160730
Unit Type and Rh: 5100

## 2022-07-21 LAB — SAMPLE TO BLOOD BANK

## 2022-07-21 MED FILL — Bortezomib For Inj 3.5 MG: INTRAMUSCULAR | Qty: 1 | Status: AC

## 2022-07-21 NOTE — Progress Notes (Signed)
CRITICAL VALUE STICKER  CRITICAL VALUE:   hgb 6.4  RECEIVER (on-site recipient of call):  Anahid Eskelson D., RN    DATE & TIME NOTIFIED:   07/21/2022 @ 1336  MESSENGER (representative from lab):  Elmyra Ricks @ [facility]  MD NOTIFIED:   Dr. Federico Flake / Vida Roller M., PA  TIME OF NOTIFICATION:  1406  RESPONSE:  Patient has transfusion appt on 07/24/22 @ [address]  Zachery Dauer, PA put orders in for 2 units PRBC.

## 2022-07-22 ENCOUNTER — Other Ambulatory Visit: Payer: Self-pay

## 2022-07-23 LAB — PREPARE RBC (CROSSMATCH)

## 2022-07-24 ENCOUNTER — Inpatient Hospital Stay: Payer: 59

## 2022-07-24 ENCOUNTER — Ambulatory Visit: Payer: 59 | Admitting: Oncology

## 2022-07-24 ENCOUNTER — Telehealth: Payer: Self-pay | Admitting: Oncology

## 2022-07-24 ENCOUNTER — Inpatient Hospital Stay (INDEPENDENT_AMBULATORY_CARE_PROVIDER_SITE_OTHER): Payer: 59 | Admitting: Oncology

## 2022-07-24 ENCOUNTER — Other Ambulatory Visit: Payer: 59

## 2022-07-24 ENCOUNTER — Telehealth: Payer: Self-pay

## 2022-07-24 VITALS — BP 137/67 | HR 105 | Temp 99.1°F | Resp 16

## 2022-07-24 VITALS — BP 144/64 | HR 105 | Temp 97.5°F | Resp 14 | Ht 65.4 in | Wt 176.9 lb

## 2022-07-24 DIAGNOSIS — C9000 Multiple myeloma not having achieved remission: Secondary | ICD-10-CM | POA: Diagnosis not present

## 2022-07-24 DIAGNOSIS — Z09 Encounter for follow-up examination after completed treatment for conditions other than malignant neoplasm: Secondary | ICD-10-CM | POA: Diagnosis not present

## 2022-07-24 DIAGNOSIS — Z5112 Encounter for antineoplastic immunotherapy: Secondary | ICD-10-CM | POA: Diagnosis not present

## 2022-07-24 DIAGNOSIS — F05 Delirium due to known physiological condition: Secondary | ICD-10-CM

## 2022-07-24 DIAGNOSIS — G893 Neoplasm related pain (acute) (chronic): Secondary | ICD-10-CM

## 2022-07-24 DIAGNOSIS — N179 Acute kidney failure, unspecified: Secondary | ICD-10-CM

## 2022-07-24 LAB — CMP (CANCER CENTER ONLY)
ALT: 31 U/L (ref 0–44)
AST: 38 U/L (ref 15–41)
Albumin: 1.6 g/dL — ABNORMAL LOW (ref 3.5–5.0)
Alkaline Phosphatase: 51 U/L (ref 38–126)
Anion gap: 13 (ref 5–15)
BUN: 31 mg/dL — ABNORMAL HIGH (ref 6–20)
CO2: 20 mmol/L — ABNORMAL LOW (ref 22–32)
Calcium: 7.8 mg/dL — ABNORMAL LOW (ref 8.9–10.3)
Chloride: 102 mmol/L (ref 98–111)
Creatinine: 3.03 mg/dL (ref 0.44–1.00)
GFR, Estimated: 17 mL/min — ABNORMAL LOW (ref >60)
Glucose, Bld: 82 mg/dL (ref 70–99)
Potassium: 3.7 mmol/L (ref 3.5–5.1)
Sodium: 135 mmol/L (ref 135–145)
Total Bilirubin: 0.9 mg/dL (ref 0.3–1.2)
Total Protein: 11.4 g/dL — ABNORMAL HIGH (ref 6.5–8.1)

## 2022-07-24 LAB — CBC WITH DIFFERENTIAL (CANCER CENTER ONLY)
Abs Immature Granulocytes: 0.03 10*3/uL (ref 0.00–0.07)
Basophils Absolute: 0 10*3/uL (ref 0.0–0.1)
Basophils Relative: 1 %
Eosinophils Absolute: 0.1 10*3/uL (ref 0.0–0.5)
Eosinophils Relative: 3 %
HCT: 32.1 % — ABNORMAL LOW (ref 36.0–46.0)
Hemoglobin: 10.3 g/dL — ABNORMAL LOW (ref 12.0–15.0)
Immature Granulocytes: 2 %
Lymphocytes Relative: 37 %
Lymphs Abs: 0.6 10*3/uL — ABNORMAL LOW (ref 0.7–4.0)
MCH: 28.5 pg (ref 26.0–34.0)
MCHC: 32.1 g/dL (ref 30.0–36.0)
MCV: 88.7 fL (ref 80.0–100.0)
Monocytes Absolute: 0.1 10*3/uL (ref 0.1–1.0)
Monocytes Relative: 4 %
Neutro Abs: 0.9 10*3/uL — ABNORMAL LOW (ref 1.7–7.7)
Neutrophils Relative %: 53 %
Platelet Count: 55 10*3/uL — ABNORMAL LOW (ref 150–400)
RBC: 3.62 MIL/uL — ABNORMAL LOW (ref 3.87–5.11)
RDW: 17.5 % — ABNORMAL HIGH (ref 11.5–15.5)
WBC Count: 1.7 10*3/uL — ABNORMAL LOW (ref 4.0–10.5)
nRBC: 14.8 % — ABNORMAL HIGH (ref 0.0–0.2)

## 2022-07-24 LAB — SAMPLE TO BLOOD BANK

## 2022-07-24 MED ORDER — SODIUM CHLORIDE 0.9% FLUSH
10.0000 mL | INTRAVENOUS | Status: AC | PRN
  Administered 2022-07-24: 10 mL

## 2022-07-24 MED ORDER — ACETAMINOPHEN 325 MG PO TABS
650.0000 mg | ORAL_TABLET | Freq: Once | ORAL | Status: AC
  Administered 2022-07-24: 650 mg via ORAL
  Filled 2022-07-24: qty 2

## 2022-07-24 MED ORDER — SODIUM CHLORIDE 0.9% IV SOLUTION
250.0000 mL | Freq: Once | INTRAVENOUS | Status: AC
  Administered 2022-07-24: 250 mL via INTRAVENOUS

## 2022-07-24 MED ORDER — LORATADINE 10 MG PO TABS: 10.0000 mg | ORAL_TABLET | Freq: Once | ORAL | Status: DC

## 2022-07-24 MED ORDER — BORTEZOMIB CHEMO SQ INJECTION 3.5 MG (2.5MG/ML)
1.3000 mg/m2 | Freq: Once | INTRAMUSCULAR | Status: AC
  Administered 2022-07-24: 2.5 mg via SUBCUTANEOUS
  Filled 2022-07-24: qty 1

## 2022-07-24 MED ORDER — PROCHLORPERAZINE MALEATE 10 MG PO TABS
10.0000 mg | ORAL_TABLET | Freq: Once | ORAL | Status: AC
  Administered 2022-07-24: 10 mg via ORAL
  Filled 2022-07-24: qty 1

## 2022-07-24 MED ORDER — HEPARIN SOD (PORK) LOCK FLUSH 100 UNIT/ML IV SOLN
500.0000 [IU] | Freq: Every day | INTRAVENOUS | Status: AC | PRN
  Administered 2022-07-24: 500 [IU]

## 2022-07-24 NOTE — Telephone Encounter (Signed)
Pt here in clinic for f/u visit with Dr Federico Flake. Pt is pale and ill appearing. She will answer questions appropriately but she defers questions to her spouse. Her spouse reports confusion over the weekend. She had episodes where she couldn't get her thoughts together or find the right words @ times. Family is unsure if the confusion is coming from the pain medication or the Revlimid. Pt's spouse held her pain medication yesterday to see if her mentation would improve. However, he had to give her a half dose as she is having headaches, unrelieved by tylenol. He is giving her the Revlimid @ night. 1 missed dose (Sat) due to her confusion (and unsure what was causing it). Pt denies N/V, mouth sores, diarrhea, SOB, chest pain, skin rashes/itching and fevers. Dr Federico Flake is changing her pain medication today. Pt also will be receiving blood transfusion today, as hgb 6.6.

## 2022-07-24 NOTE — Progress Notes (Signed)
Patient presents to clinic this am and states "I am sick". When patient asked to describe sickness she states to "ask my husband" . Her husband reports that she has been confused for most of the weekend and was having severe headaches- States that Dr. Federico Flake is aware and that her pain control pan has changed- States that they think her confusion is coming from her pain meds. Patient is oriented to person, place and time. Color pale. Denies pain at present.

## 2022-07-24 NOTE — Patient Instructions (Signed)
Blood Transfusion, Adult, Care After The following information offers guidance on how to care for yourself after your procedure. Your health care provider may also give you more specific instructions. If you have problems or questions, contact your health care provider. What can I expect after the procedure? After the procedure, it is common to have: Bruising and soreness where the IV was inserted. A headache. Follow these instructions at home: IV insertion site care     Follow instructions from your health care provider about how to take care of your IV insertion site. Make sure you: Wash your hands with soap and water for at least 20 seconds before and after you change your bandage (dressing). If soap and water are not available, use hand sanitizer. Change your dressing as told by your health care provider. Check your IV insertion site every day for signs of infection. Check for: Redness, swelling, or pain. Bleeding from the site. Warmth. Pus or a bad smell. General instructions Take over-the-counter and prescription medicines only as told by your health care provider. Rest as told by your health care provider. Return to your normal activities as told by your health care provider. Keep all follow-up visits. Lab tests may need to be done at certain periods to recheck your blood counts. Contact a health care provider if: You have itching or red, swollen areas of skin (hives). You have a fever or chills. You have pain in the head, back, or chest. You feel anxious or you feel weak after doing your normal activities. You have redness, swelling, warmth, or pain around the IV insertion site. You have blood coming from the IV insertion site that does not stop with pressure. You have pus or a bad smell coming from your IV insertion site. If you received your blood transfusion in an outpatient setting, you will be told whom to contact to report any reactions. Get help right away if: You  have symptoms of a serious allergic or immune system reaction, including: Trouble breathing or shortness of breath. Swelling of the face, feeling flushed, or widespread rash. Dark urine or blood in the urine. Fast heartbeat. These symptoms may be an emergency. Get help right away. Call 911. Do not wait to see if the symptoms will go away. Do not drive yourself to the hospital. Summary Bruising and soreness around the IV insertion site are common. Check your IV insertion site every day for signs of infection. Rest as told by your health care provider. Return to your normal activities as told by your health care provider. Get help right away for symptoms of a serious allergic or immune system reaction to the blood transfusion. This information is not intended to replace advice given to you by your health care provider. Make sure you discuss any questions you have with your health care provider. Document Revised: 11/18/2021 Document Reviewed: 11/18/2021 Elsevier Patient Education  [place]. Bortezomib Injection What is this medication? BORTEZOMIB (bor TEZ oh mib) treats lymphoma. It may also be used to treat multiple myeloma, a type of bone marrow cancer. It works by blocking a protein that causes cancer cells to grow and multiply. This helps to slow or stop the spread of cancer cells. This medicine may be used for other purposes; ask your health care provider or pharmacist if you have questions. COMMON BRAND NAME(S): Velcade What should I tell my care team before I take this medication? They need to know if you have any of these conditions: Dehydration Diabetes Heart  disease Liver disease Tingling of the fingers or toes or other nerve disorder An unusual or allergic reaction to bortezomib, other medications, foods, dyes, or preservatives If you or your partner are pregnant or trying to get pregnant Breastfeeding How should I use this medication? This medication is injected  into a vein or under the skin. It is given by your care team in a hospital or clinic setting. Talk to your care team about the use of this medication in children. Special care may be needed. Overdosage: If you think you have taken too much of this medicine contact a poison control center or emergency room at once. NOTE: This medicine is only for you. Do not share this medicine with others. What if I miss a dose? Keep appointments for follow-up doses. It is important not to miss your dose. Call your care team if you are unable to keep an appointment. What may interact with this medication? Ketoconazole Rifampin This list may not describe all possible interactions. Give your health care provider a list of all the medicines, herbs, non-prescription drugs, or dietary supplements you use. Also tell them if you smoke, drink alcohol, or use illegal drugs. Some items may interact with your medicine. What should I watch for while using this medication? Your condition will be monitored carefully while you are receiving this medication. You may need blood work while taking this medication. This medication may affect your coordination, reaction time, or judgment. Do not drive or operate machinery until you know how this medication affects you. Sit up or stand slowly to reduce the risk of dizzy or fainting spells. Drinking alcohol with this medication can increase the risk of these side effects. This medication may increase your risk of getting an infection. Call your care team for advice if you get a fever, chills, sore throat, or other symptoms of a cold or flu. Do not treat yourself. Try to avoid being around people who are sick. Check with your care team if you have severe diarrhea, nausea, and vomiting, or if you sweat a lot. The loss of too much body fluid may make it dangerous for you to take this medication. Talk to your care team if you may be pregnant. Serious birth defects can occur if you take this  medication during pregnancy and for 7 months after the last dose. You will need a negative pregnancy test before starting this medication. Contraception is recommended while taking this medication and for 7 months after the last dose. Your care team can help you find the option that works for you. If your partner can get pregnant, use a condom during sex while taking this medication and for 4 months after the last dose. Do not breastfeed while taking this medication and for 2 months after the last dose. This medication may cause infertility. Talk to your care team if you are concerned about your fertility. What side effects may I notice from receiving this medication? Side effects that you should report to your care team as soon as possible: Allergic reactions--skin rash, itching, hives, swelling of the face, lips, tongue, or throat Bleeding--bloody or black, tar-like stools, vomiting blood or brown material that looks like coffee grounds, red or dark brown urine, small red or purple spots on skin, unusual bruising or bleeding Bleeding in the brain--severe headache, stiff neck, confusion, dizziness, change in vision, numbness or weakness of the face, arm, or leg, trouble speaking, trouble walking, vomiting Bowel blockage--stomach cramping, unable to have a bowel movement or  pass gas, loss of appetite, vomiting Heart failure--shortness of breath, swelling of the ankles, feet, or hands, sudden weight gain, unusual weakness or fatigue Infection--fever, chills, cough, sore throat, wounds that don't heal, pain or trouble when passing urine, general feeling of discomfort or being unwell Liver injury--right upper belly pain, loss of appetite, nausea, light-colored stool, dark yellow or brown urine, yellowing skin or eyes, unusual weakness or fatigue Low blood pressure--dizziness, feeling faint or lightheaded, blurry vision Lung injury--shortness of breath or trouble breathing, cough, spitting up blood, chest  pain, fever Pain, tingling, or numbness in the hands or feet Severe or prolonged diarrhea Stomach pain, bloody diarrhea, pale skin, unusual weakness or fatigue, decrease in the amount of urine, which may be signs of hemolytic uremic syndrome Sudden and severe headache, confusion, change in vision, seizures, which may be signs of posterior reversible encephalopathy syndrome (PRES) TTP--purple spots on the skin or inside the mouth, pale skin, yellowing skin or eyes, unusual weakness or fatigue, fever, fast or irregular heartbeat, confusion, change in vision, trouble speaking, trouble walking Tumor lysis syndrome (TLS)--nausea, vomiting, diarrhea, decrease in the amount of urine, dark urine, unusual weakness or fatigue, confusion, muscle pain or cramps, fast or irregular heartbeat, joint pain Side effects that usually do not require medical attention (report to your care team if they continue or are bothersome): Constipation Diarrhea Fatigue Loss of appetite Nausea This list may not describe all possible side effects. Call your doctor for medical advice about side effects. You may report side effects to FDA at 1-800-FDA-1088. Where should I keep my medication? This medication is given in a hospital or clinic. It will not be stored at home. NOTE: This sheet is a summary. It may not cover all possible information. If you have questions about this medicine, talk to your doctor, pharmacist, or health care provider.  2023 Elsevier/Gold Standard (2022-01-18 00:00:00)

## 2022-07-24 NOTE — Telephone Encounter (Signed)
Patient has been scheduled for follow-up visit per 07/24/22 los. Pt given an appt calendar with date and time.  

## 2022-07-24 NOTE — Progress Notes (Signed)
Sent referral to [facility] to help assist patient apply for Disability.

## 2022-07-24 NOTE — Progress Notes (Signed)
[facility]  Visit:  Patient Care Team: Earlyne Iba, NP as PCP - General (Nurse Practitioner) Barbee Cough, MD as Consulting Physician (Internal Medicine)  CHIEF COMPLAINTS/PURPOSE OF CONSULTATION:  Oncology History  Multiple myeloma ([place])  06/20/2022 Initial Diagnosis   Multiple myeloma ([place])   06/29/2022 - 07/13/2022 Chemotherapy   Patient is on Treatment Plan : MULTIPLE MYELOMA CyBorD - Weekly Bortezomib     07/12/2022 Cancer Staging   Staging form: Plasma Cell Myeloma and Plasma Cell Disorders, AJCC 8th Edition - Clinical stage from 07/12/2022: RISS Stage III (Beta-2-microglobulin (mg/L): 17.7, Albumin (g/dL): 2.9, ISS: Stage III, High-risk cytogenetics: Present, LDH: Normal) - Signed by Barbee Cough, MD on 07/13/2022 Histopathologic type: Multiple myeloma Stage prefix: Initial diagnosis Beta 2 microglobulin range (mg/L): Greater than or equal to 5.5 Albumin range (g/dL): Less than 3.5 Cytogenetics: 17p deletion, t(11;14) translocation Lactate dehydrogenase (LDH) (U/L): 133 Serum calcium level: Elevated Pre-operative calcium level (mg/dL): 13.3 Serum creatinine level: Elevated Creatinine (mg/dL): 3.3 Bone disease on imaging: Present Hemoglobin (Hgb) (g/dL): 6.3 Pretreatment IgG (mg/dL): 215 Pretreatment IgA (mg/dL): 6,400 Pretreatment IgM (mg/dL): 13 Pretreatment monoclonal protein level in serum (M spike) (g/dL): 6 Pretreatment monoclonal protein level in 24-hour urine (M spike) (g): 1 Stage used in treatment planning: Yes National guidelines used in treatment planning: Yes   07/20/2022 -  Chemotherapy   Patient is on Treatment Plan : MYELOMA NEWLY DIAGNOSED TRANSPLANT CANDIDATE DaraVRd (Daratumumab SQ) q21d x 6 Cycles (Induction/Consolidation)       HISTORY OF PRESENTING ILLNESS: PAISLY FINGERHUT 56 y.o. female is here because of  multiple myeloma Medical history notable for DHD, GERD, hypothyroidism, osteoarthritis,  fibromyalgia, stomach polyps  June 14, 2022 through June 19 2022: Admitted to [facility] following several weeks of feeling poorly.  She has been to urgent care on several occasions during the weeks prior with complaints of facial pain and headache.  On each occasion she was given antibiotics and on 1 occasion Medrol Dosepak was added.  Patient was able to see PCP this week who drew labs which were noted to be abnormal she was subsequently directed to the emergency room. CT head demonstrated multiple lytic lesions throughout the bony calvarium suggesting neoplastic process.  Largest of these was in the left frontal calvarium.  There is a soft tissue mass extending from this large lytic lesion measuring 3.8 x 3.3 cm in size coarse calcifications extending into the scalp and extra-axial location left frontal region.  INR 1.2 PTT 26.3 WBC 9.1 hemoglobin 6.3 MCV 98 platelet count 73; 54 segs 4 bands 35 lymphs 3 monos 2 eos 2S.  Chemistries notable for sodium 132 creatinine 3.30 glucose 122 calcium 13.3 corrected to 14.4 ionized calcium 2.35 albumin 2.9 total protein 12.2 AST 63 ALT 73  Immunofixation showed a biclonal IgA kappa protein Serum free kappa 3067 lambda 2.3 with a kappa lambda 1333.  IgG 215 IgA >6400 IgM 13 Blood viscosity 5.0  24 hr urine showed loss of 1582 mg protein.  UPEP  showed loss of 1023 mg of paraprotein    Hypercalcemia was treated with dexamethasone, IV hydration, symptomatic and calcitonin  June 15 2022: CT chest abdomen pelvis contrast:  Diffuse moth-eaten appearance of the skeleton with bilateral  pathologic rib fractures of various ages. Expansile 2 cm inferior left scapular lytic lesion. Small 2.0 cm soft tissue lesion in the right paraspinal region at the level of the fourth costovertebral  junction. Findings are compatible with  suspected multiple myeloma.  Small dependent left pleural effusion Trace pericardial effusion.  No lymphadenopathy.   June 16 2022: Port-A-Cath placed by radiology  June 17, 2022: Cardiac echo.  LVEF 60 to 65%.  Right atrium moderately dilated.  Moderate tricuspid valve regurg.  IVC dilated.  Pulmonary artery systolic pressure moderately elevated at 54 mmHg  June 19 2022: Right posterior iliac crest bone marrow biopsy and aspirate Extensive involvement by atypical plasma cells  representing 70% of all cells in the aspirate associated with variably sized aggregates and diffuse sheets in the clot and biopsy sections.  The plasma cells display kappa light chain restriction consistent with plasma cell neoplasm FISH analysis demonstrated a T p53 deletion as well as Cyclin D1/IgH fusion  June 22 2022:  Post hospital follow up for management of multiple myeloma.    Reviewed results of labs and bone marrow biopsy with patient and family.    June 28 2022:  Admitted to [facility] with bilateral pleural effusions, R >> L.  Therapeutic Right thoracocentesis yielded 2.2 liters of bloody pleural fluid with relief of symptoms.  Cytology showed no malignant cells  June 29, 2022: Discharged home so as to begin chemotherapy.  Cycle 1 day 1 CyBorD July 01 2022:  Presented to [facility] with SOB.   CT CAP showed  Large right hemothorax.  Trace left-sided pleural effusion, decreased in the interval.  New patchy ground-glass opacity anteriorly in the left upper lobe with nodular peripheral ground-glass opacity in the posterior left  upper lobe. Imaging features are nonspecific and may be  infectious/inflammatory Bibasilar collapse/consolidation, right greater than left and similar to 06/28/2022..  Numerous lucent lesions throughout the visualized bony anatomy  compatible with the reported clinical history of multiple myeloma.  Multiple bilateral rib fractures of varying age, as before. Superior endplate compression deformity at T9 and T12 is stable.  No acute findings in the abdomen or pelvis.  Right Chest tube  placed with recovery of bloody pleural fluid.  Labs notable for Hgb 4.8.  Patient was transferred to [facility] for acute management  July 02 2022:  Admitted to [facility]  Placed in ICU.  Chest tube removed as bloody output declined.   Transfused with 3 units PRBCs CT Chest without contrast showed  Right-sided chest tube in place with tip terminating in the right upper lobe with surrounding pulmonary hemorrhage. Similar to prior, the side-port terminates outside of the thoracic cavity. This tube is not well positioned to drain the remaining right-sided pleural fluid.  Moderate sized right hydropneumothorax.    July 08 2022:  WBC 2.6 Hgb 8.2 PLT 69; 39 seg 51 lymph 7 mono 2 eos 1 baso.  Cr 2.37   July 11 2022:  Post hospital follow up.  Having considerable back pain.  Also has pain in  right chest which was site of chest tube.  Fatigued.  Not sleeping well due to pain.  Using narcotic analgesics sparingly because she is concerned about developing addiction.  Has oxycodone,  gabapentin, Trazadone for pain.  Asked her to provide a tally of how many oxycodone tablets she has.  She requested considering addition of a muscle relaxer.   Reviewed results of bone marrow, SPEP with IEP, free light chain and FISH results with patient and her family   WBC 2.7 hemoglobin 7.6 platelet count 93; 32 segs 53 lymphs 6 monos 2 eos 1 basophil 6 immature  Chemistry is notable for creatinine 2.89 albumin 1.6 total protein 10.7.  SPEP  with IEP demonstrated an M spike of 6.8 g/dL of paraprotein.  Serum free kappa 4673 lambda 2.8 with a kappa lambda 1453 IgG 188 IgA 4897 IgM 15  July 13 2022:  Cycle 1 Day 8 (delayed)  July 18 2022:  Scheduled follow up for management of multiple myeloma.  Reviewed results of labs with patient, her sister and mother.  She continues to have fatigue and rib pain.  Taking oxycodone sparingly.  Discussed the use of low-dose long-acting MS Contin.  Since last visit I have also contacted  Dr. Enzo Montgomery at [facility] to discuss autotransplant.  We will send records regarding patient.  Patient also having scotomas and blurry vision.  I contacted Dr. Dwana Melena from [facility] to arrange for a follow up.  There is concern that her visual disturbances are related to hyperviscosity from myeloma.  Needing help with disability paperwork.  Requesting home PT.    WBC 1.9 hemoglobin 7.3 platelet count 71 and RBC 1.6  July 19, 2022 received 1 unit of packed RBCs  July 20 2022:  Cycle 1 Bortezomib/ Lenolidomide/Dex/ Dara July 21, 2022: WBC 1.7 hemoglobin 6.4 platelet count 62; 66 segs 20 lymphs 4 monos 2 eos  July 24, 2022:  Scheduled follow-up for management of multiple myeloma.  Had episodes of confusion since last visit which was exacerbated by taking 1/2 tablets of MS Contin 15 mg for pain (Hold) Has also taken gabapentin.  (Hold) Xyzal has been held since friday Not taking clolopin, trazodone, zyrtec, scopolamine patch  Here with husband and mother Husband does most of the talking  Discussed medication induced delerium To receive 2 units of packed red blood cells today  Review of Systems  Constitutional:  Positive for fatigue. Negative for chills and fever.       Here with husband and mother.  Husband provides most of the history  HENT:   Negative for hearing loss, lump/mass, mouth sores, nosebleeds, sore throat, tinnitus, trouble swallowing and voice change.   Eyes:  Positive for eye problems. Negative for icterus.       Vision changes:  None  Respiratory:  Positive for chest tightness. Negative for cough, hemoptysis, shortness of breath and wheezing.        PND:  none Orthopnea:  none Has chest wall pain owing to myeloma   Cardiovascular:  Negative for chest pain, leg swelling and palpitations.       PND:  none Orthopnea:  none  Gastrointestinal:  Negative for abdominal distention, abdominal pain, blood in stool, constipation, diarrhea, nausea and  vomiting.       Constipation improved with treatment of hypecalcemia  Endocrine: Negative for hot flashes.       Cold intolerance:  none Heat intolerance:  none  Genitourinary:  Negative for bladder incontinence, difficulty urinating, dysuria, frequency, hematuria and nocturia.   Musculoskeletal:  Positive for arthralgias and myalgias. Negative for back pain, gait problem, neck pain and neck stiffness.       Arthralgias in shoulders, ribs, back which makes sleeping difficult.  Uses Oxycodone infrequently since it does not provide much relief  Skin:  Negative for itching, rash and wound.  Neurological:  Positive for extremity weakness and headaches. Negative for dizziness, gait problem, light-headedness, numbness, seizures and speech difficulty.  Hematological:  Negative for adenopathy. Does not bruise/bleed easily.  Psychiatric/Behavioral:  Positive for decreased concentration and sleep disturbance. Negative for confusion and suicidal ideas. The patient is not nervous/anxious.  Thinking foggy despite improvement in hypercalcemia    MEDICAL HISTORY: Past Medical History:  Diagnosis Date   ADHD (attention deficit hyperactivity disorder)    Anxiety    Depression    GERD (gastroesophageal reflux disease)    Hypothyroidism    Multiple allergies    Osteoarthritis     SURGICAL HISTORY: Past Surgical History:  Procedure Laterality Date   ADENOIDECTOMY     BONE MARROW BIOPSY     CHEST TUBE INSERTION     Taken out after 3 days   KNEE SURGERY Bilateral    PARTIAL HYSTERECTOMY     SINOSCOPY     TONSILLECTOMY     TUBAL LIGATION     WRIST SURGERY Bilateral     SOCIAL HISTORY: Social History   Socioeconomic History   Marital status: Married    Spouse name: Carloyn Manner   Number of children: 2   Years of education: 14   Highest education level: Associate degree: academic program  Occupational History   Not on file  Tobacco Use   Smoking status: Never   Smokeless tobacco: Never   Vaping Use   Vaping Use: Never used  Substance and Sexual Activity   Alcohol use: Yes    Comment: rarely   Drug use: Never   Sexual activity: Not Currently    Partners: Male    Comment: Married but no interest  Other Topics Concern   Not on file  Social History Narrative   Not on file   Social Determinants of Health   Financial Resource Strain: Not on file  Food Insecurity: Not on file  Transportation Needs: Not on file  Physical Activity: Not on file  Stress: Not on file  Social Connections: Not on file  Intimate Partner Violence: Not on file    FAMILY HISTORY Family History  Problem Relation Age of Onset   Hypertension Mother    Diabetes Mother    Glaucoma Mother    Gout Mother    Congestive Heart Failure Mother    Allergic rhinitis Father    Thyroid disease Father    Allergic rhinitis Brother    Gout Brother     ALLERGIES:  is allergic to doxycycline and tape.  MEDICATIONS:  Current Outpatient Medications  Medication Sig Dispense Refill   acyclovir (ZOVIRAX) 400 MG tablet Take 1 tablet (400 mg total) by mouth 2 (two) times daily. 60 tablet 3   amoxicillin (AMOXIL) 500 MG tablet TAKE 4 TABLETS PRIOR TO DENTAL PROCEDURE AS DIRECTED.     ASPIRIN 81 PO Take 81 mg by mouth daily.     azelastine (ASTELIN) 0.1 % nasal spray Place 2 sprays into both nostrils 2 (two) times daily as needed for rhinitis. Use in each nostril as directed 30 mL 3   cetirizine (ZYRTEC) 10 MG tablet Take 10 mg by mouth daily.     clonazePAM (KLONOPIN) 0.5 MG tablet Take 1 tablet (0.5 mg total) by mouth 2 (two) times daily as needed for anxiety. 60 tablet 1   dexamethasone (DECADRON) 4 MG tablet Take 4 mg by mouth daily.     dexamethasone (DECADRON) 4 MG tablet Take 8 mg po daily for 2 days after Cytoxan chemotherapy. 30 tablet 3   DEXILANT 60 MG capsule Take 1 capsule by mouth daily.  0   fluticasone (FLONASE) 50 MCG/ACT nasal spray Place 1 spray into the nose daily.     gabapentin  (NEURONTIN) 400 MG capsule Take 400 mg by mouth at bedtime.  lenalidomide (REVLIMID) 10 MG capsule Take 1 capsule (10 mg total) by mouth daily. Take for 14 days, then hold for 7 days. Repeat every 21 days. 14 capsule 0   levocetirizine (XYZAL) 5 MG tablet TAKE ONE TABLET BY MOUTH TWICE DAILY AS DIRECTED 60 tablet 0   levothyroxine (SYNTHROID) 88 MCG tablet Take 88 mcg by mouth daily.  3   montelukast (SINGULAIR) 10 MG tablet Take 1 tablet by mouth daily.     morphine (MS CONTIN) 15 MG 12 hr tablet Take 1 tablet (15 mg total) by mouth every 12 (twelve) hours. 60 tablet 0   ondansetron (ZOFRAN-ODT) 4 MG disintegrating tablet Take 4 mg by mouth every 8 (eight) hours as needed.     oxyCODONE (OXY IR/ROXICODONE) 5 MG immediate release tablet Take by mouth. Take 1 tablet (5 mg total) by mouth every 4 (four) hours as needed for Moderate pain (4-6) or Severe pain (7-10).     promethazine (PHENERGAN) 25 MG tablet Take 25 mg by mouth 2 (two) times daily as needed.     scopolamine (TRANSDERM-SCOP) 1 MG/3DAYS Place 1 patch (1.5 mg total) onto the skin every 3 (three) days. 10 patch 12   traZODone (DESYREL) 50 MG tablet Take 1 tablet (50 mg total) by mouth at bedtime. 30 tablet 1   Wheat Dextrin (BENEFIBER PO) Take by mouth.     Current Facility-Administered Medications  Medication Dose Route Frequency Provider Last Rate Last Admin   acetaminophen (TYLENOL) tablet 650 mg  650 mg Oral Once Mosher, Vida Roller A, PA-C        PHYSICAL EXAMINATION:  ECOG PERFORMANCE STATUS: 2 - Symptomatic, <50% confined to bed   There were no vitals filed for this visit.   There were no vitals filed for this visit.    Physical Exam Vitals and nursing note reviewed.  Constitutional:      General: She is not in acute distress.    Appearance: Normal appearance. She is obese. She is not toxic-appearing or diaphoretic.     Comments: Here with husband and mother.  Husband provides most of the history  HENT:     Head:  Normocephalic and atraumatic.     Right Ear: External ear normal.     Left Ear: External ear normal.     Nose: Nose normal. No congestion or rhinorrhea.     Mouth/Throat:     Mouth: Mucous membranes are moist.  Eyes:     General: No scleral icterus.    Extraocular Movements: Extraocular movements intact.     Conjunctiva/sclera: Conjunctivae normal.     Pupils: Pupils are equal, round, and reactive to light.  Cardiovascular:     Rate and Rhythm: Normal rate and regular rhythm.     Heart sounds: Normal heart sounds. No murmur heard.    No friction rub. No gallop.  Pulmonary:     Effort: Pulmonary effort is normal. No respiratory distress.     Breath sounds: Normal breath sounds. No stridor. No wheezing or rales.  Abdominal:     General: Bowel sounds are normal. There is no distension.     Palpations: Abdomen is soft.     Tenderness: There is no abdominal tenderness. There is no guarding or rebound.  Musculoskeletal:        General: No swelling, tenderness or deformity.     Cervical back: Normal range of motion and neck supple. No rigidity or tenderness.     Right lower leg: No edema.  Left lower leg: No edema.  Lymphadenopathy:     Head:     Right side of head: No submental, submandibular, tonsillar, preauricular, posterior auricular or occipital adenopathy.     Left side of head: No submental, submandibular, tonsillar, preauricular, posterior auricular or occipital adenopathy.     Cervical: No cervical adenopathy.     Right cervical: No superficial, deep or posterior cervical adenopathy.    Left cervical: No superficial, deep or posterior cervical adenopathy.     Upper Body:     Right upper body: No supraclavicular, axillary, pectoral or epitrochlear adenopathy.     Left upper body: No supraclavicular, axillary, pectoral or epitrochlear adenopathy.  Skin:    General: Skin is warm.     Coloration: Skin is not jaundiced.     Findings: No bruising, erythema or rash.   Neurological:     General: No focal deficit present.     Mental Status: She is alert and oriented to person, place, and time. Mental status is at baseline.     Cranial Nerves: No cranial nerve deficit.     Sensory: No sensory deficit.  Psychiatric:        Mood and Affect: Mood normal.        Behavior: Behavior normal.        Thought Content: Thought content normal.        Judgment: Judgment normal.      LABORATORY DATA: I have personally reviewed the data as listed:  Clinical Support on 07/21/2022  Component Date Value Ref Range Status   WBC Count 07/21/2022 1.7 (L)  4.0 - 10.5 K/uL Final   RBC 07/21/2022 2.29 (L)  3.87 - 5.11 MIL/uL Final   Hemoglobin 07/21/2022 6.4 (LL)  12.0 - 15.0 g/dL Final   Comment: REPEATED TO VERIFY THIS CRITICAL RESULT HAS VERIFIED AND BEEN CALLED TO DUNLAP,K. RN BY NICOLE MCCOY ON 11 17 2023 AT 1336, AND HAS BEEN READ BACK. CRITICA L RESULT VERIFIED    HCT 07/21/2022 20.4 (L)  36.0 - 46.0 % Final   MCV 07/21/2022 89.1  80.0 - 100.0 fL Final   MCH 07/21/2022 27.9  26.0 - 34.0 pg Final   MCHC 07/21/2022 31.4  30.0 - 36.0 g/dL Final   RDW 07/21/2022 18.1 (H)  11.5 - 15.5 % Final   Platelet Count 07/21/2022 62 (L)  150 - 400 K/uL Final   Comment: Immature Platelet Fraction may be clinically indicated, consider ordering this additional test [id number] CONSISTENT WITH PREVIOUS RESULT    nRBC 07/21/2022 1.8 (H)  0.0 - 0.2 % Final   Neutrophils Relative % 07/21/2022 66  % Final   Neutro Abs 07/21/2022 1.1 (L)  1.7 - 7.7 K/uL Final   Lymphocytes Relative 07/21/2022 28  % Final   Lymphs Abs 07/21/2022 0.5 (L)  0.7 - 4.0 K/uL Final   Monocytes Relative 07/21/2022 4  % Final   Monocytes Absolute 07/21/2022 0.1  0.1 - 1.0 K/uL Final   Eosinophils Relative 07/21/2022 0  % Final   Eosinophils Absolute 07/21/2022 0.0  0.0 - 0.5 K/uL Final   Basophils Relative 07/21/2022 0  % Final   Basophils Absolute 07/21/2022 0.0  0.0 - 0.1 K/uL Final   Immature  Granulocytes 07/21/2022 2  % Final   Abs Immature Granulocytes 07/21/2022 0.04  0.00 - 0.07 K/uL Final   [place] 07/21/2022 PRESENT   Final   Performed at [facility], [place] [address] [place], [place] [address]   Blood  Bank Specimen 07/21/2022 SAMPLE AVAILABLE FOR TESTING   Final   Sample Expiration 07/21/2022    Final                   Value:07/24/2022,2359 Performed at [facility], [place] [address], [place], [place] [address]    ABO/RH(D) 07/21/2022 O POS   Final   Antibody Screen 07/21/2022 POS   Final   Sample Expiration 07/21/2022 07/24/2022,2359   Final   Antibody Identification [id number]/3716    Final                   Value:NON SPECIFIC ANTIBODY REACTIVITY Performed at [facility], [place] [address] [place], [place] [address]    Unit Number 07/21/2022 [id number]   Final   Blood Component Type 07/21/2022 RED CELLS,LR   Final   Unit division 07/21/2022 00   Final   Status of Unit 07/21/2022 ALLOCATED   Final   Transfusion Status 07/21/2022 OK TO TRANSFUSE   Final   Crossmatch Result 07/21/2022 COMPATIBLE   Final   Donor AG Type 07/21/2022 NEGATIVE FOR E ANTIGEN NEGATIVE FOR KELL ANTIGEN NEGATIVE FOR DUFFY A ANTIGEN NEGATIVE FOR S ANTIGEN   Final   Unit Number 07/21/2022 [id number]   Final   Blood Component Type 07/21/2022 RED CELLS,LR   Final   Unit division 07/21/2022 00   Final   Status of Unit 07/21/2022 ALLOCATED   Final   Donor AG Type 07/21/2022 NEGATIVE FOR E ANTIGEN NEGATIVE FOR KELL ANTIGEN NEGATIVE FOR S ANTIGEN NEGATIVE FOR DUFFY A ANTIGEN   Final   Transfusion Status 07/21/2022 OK TO TRANSFUSE   Final   Crossmatch Result 07/21/2022 COMPATIBLE   Final   Blood Product Unit Number 07/21/2022 [id number]   Final   PRODUCT CODE 07/21/2022 [id number]   Final   Unit Type and Rh 07/21/2022 5100   Final   Blood Product Expiration Date 07/21/2022 245809983382   Final   Blood Product Unit Number 07/21/2022  [id number]   Final   PRODUCT CODE 07/21/2022 [id number]   Final   Unit Type and Rh 07/21/2022 5100   Final   Blood Product Expiration Date 07/21/2022 [id number]   Final   Order Confirmation 07/23/2022    Final                   Value:ORDER PROCESSED BY BLOOD BANK Performed at [facility], [place] [address], [place], [place] [address]   Office Visit on 07/18/2022  Component Date Value Ref Range Status   PT AG Type 07/12/2022    Final                   Value:POSITIVE FOR c ANTIGEN POSITIVE FOR C ANTIGEN NEGATIVE FOR E ANTIGEN POSITIVE FOR e ANTIGEN NEGATIVE FOR KELL ANTIGEN NEGATIVE FOR DUFFY A ANTIGEN POSITIVE FOR DUFFY B ANTIGEN POSITIVE FOR KIDD A ANTIGEN POSITIVE FOR KIDD B ANTIGEN NEGATIVE FOR S ANTIGEN  POSITIVE FOR s ANTIGEN POSITIVE FOR M ANTIGEN Performed at [facility], [place] [address] [place], [place] [address]   Clinical Support on 07/18/2022  Component Date Value Ref Range Status   ABO/RH(D) 07/18/2022 O POS   Final   Antibody Screen 07/18/2022 POS   Final   Sample Expiration 07/18/2022 07/21/2022,2359   Final   Antibody Identification 89/21/1941 ANTI E   Final   Unit Number 07/18/2022 [id number]   Final   Blood Component Type 07/18/2022 RED CELLS,LR  Final   Unit division 07/18/2022 00   Final   Status of Unit 07/18/2022 ISSUED,FINAL   Final   Donor AG Type 07/18/2022 NEGATIVE FOR E ANTIGEN   Final   Transfusion Status 07/18/2022 OK TO TRANSFUSE   Final   Crossmatch Result 07/18/2022 COMPATIBLE   Final   WBC Count 07/18/2022 1.9 (L)  4.0 - 10.5 K/uL Final   RBC 07/18/2022 2.54 (L)  3.87 - 5.11 MIL/uL Final   Hemoglobin 07/18/2022 7.3 (L)  12.0 - 15.0 g/dL Final   HCT 07/18/2022 23.5 (L)  36.0 - 46.0 % Final   MCV 07/18/2022 92.5  80.0 - 100.0 fL Final   MCH 07/18/2022 28.7  26.0 - 34.0 pg Final   MCHC 07/18/2022 31.1  30.0 - 36.0 g/dL Final   RDW 07/18/2022 18.3 (H)  11.5 - 15.5 % Final   Platelet Count 07/18/2022 71 (L)  150  - 400 K/uL Final   Comment: SPECIMEN CHECKED FOR CLOTS Immature Platelet Fraction may be clinically indicated, consider ordering this additional test [id number] CONSISTENT WITH PREVIOUS RESULT REPEATED TO VERIFY    nRBC 07/18/2022 1.6 (H)  0.0 - 0.2 % Final   Performed at [facility], [place] [address] [place], [place] [address]   WBC Count 07/19/2022 2.0 (L)  4.0 - 10.5 K/uL Final   RBC 07/19/2022 2.41 (L)  3.87 - 5.11 MIL/uL Final   Hemoglobin 07/19/2022 6.7 (LL)  12.0 - 15.0 g/dL Final   Comment: REPEATED TO VERIFY THIS CRITICAL RESULT HAS VERIFIED AND BEEN CALLED TO RN KATRINA D BY ALEXIS CRUICKSHANK ON 11 16 2023 AT 1413, AND HAS BEEN READ BACK.     HCT 07/19/2022 22.2 (L)  36.0 - 46.0 % Final   MCV 07/19/2022 92.1  80.0 - 100.0 fL Final   MCH 07/19/2022 27.8  26.0 - 34.0 pg Final   MCHC 07/19/2022 30.2  30.0 - 36.0 g/dL Final   RDW 07/19/2022 18.1 (H)  11.5 - 15.5 % Final   Platelet Count 07/19/2022 66 (L)  150 - 400 K/uL Final   Comment: SPECIMEN CHECKED FOR CLOTS Immature Platelet Fraction may be clinically indicated, consider ordering this additional test [id number] CONSISTENT WITH PREVIOUS RESULT REPEATED TO VERIFY    nRBC 07/19/2022 1.0 (H)  0.0 - 0.2 % Final   Neutrophils Relative % 07/19/2022 32  % Final   Neutro Abs 07/19/2022 0.6 (L)  1.7 - 7.7 K/uL Final   Lymphocytes Relative 07/19/2022 49  % Final   Lymphs Abs 07/19/2022 1.0  0.7 - 4.0 K/uL Final   Monocytes Relative 07/19/2022 8  % Final   Monocytes Absolute 07/19/2022 0.2  0.1 - 1.0 K/uL Final   Eosinophils Relative 07/19/2022 3  % Final   Eosinophils Absolute 07/19/2022 0.1  0.0 - 0.5 K/uL Final   Basophils Relative 07/19/2022 1  % Final   Basophils Absolute 07/19/2022 0.0  0.0 - 0.1 K/uL Final   Immature Granulocytes 07/19/2022 7  % Final   Abs Immature Granulocytes 07/19/2022 0.13 (H)  0.00 - 0.07 K/uL Final   Rouleaux 07/19/2022 PRESENT   Final   Performed at [facility], [place] [address], [place], [place] [address]   Order Confirmation 07/19/2022    Final                   Value:ORDER PROCESSED BY BLOOD BANK Performed at [facility], [place] [address], [place], [place] [address]    ISSUE  DATE / TIME 07/18/2022 [id number]   Final   Blood Product Unit Number 07/18/2022 [id number]   Final   PRODUCT CODE 07/18/2022 [id number]   Final   Unit Type and Rh 07/18/2022 5100   Final   Blood Product Expiration Date 07/18/2022 [id number]   Final  Lab on 07/12/2022  Component Date Value Ref Range Status   ABO/RH(D) 07/12/2022 O POS   Final   Antibody Screen 07/12/2022 POS   Final   Sample Expiration 07/12/2022 07/15/2022,2359   Final   Antibody Identification 28/20/6015 ANTI E   Final   PT AG Type 07/12/2022 NEGATIVE FOR E ANTIGEN   Final   Unit Number 07/12/2022 [id number]   Final   Blood Component Type 07/12/2022 RED CELLS,LR   Final   Unit division 07/12/2022 00   Final   Status of Unit 07/12/2022 ISSUED,FINAL   Final   Donor AG Type 07/12/2022 NEGATIVE FOR E ANTIGEN   Final   Transfusion Status 07/12/2022 OK TO TRANSFUSE   Final   Crossmatch Result 07/12/2022 COMPATIBLE   Final   Order Confirmation 07/12/2022    Final                   Value:ORDER PROCESSED BY BLOOD BANK Performed at [facility], [place] [address], [place], [place] [address]    ISSUE DATE / TIME 07/12/2022 [id number]   Final   Blood Product Unit Number 07/12/2022 [id number]   Final   PRODUCT CODE 07/12/2022 [id number]   Final   Unit Type and Rh 07/12/2022 9500   Final   Blood Product Expiration Date 07/12/2022 [id number]   Final   ABO/RH(D) 07/12/2022    Final                   Value:O POS Performed at [facility], [place] [address], [place], [place] [address]   Appointment on 07/11/2022  Component Date Value Ref Range Status   Sodium 07/11/2022 138  135 - 145 mmol/L Final   Potassium 07/11/2022 4.4  3.5 - 5.1  mmol/L Final   Chloride 07/11/2022 100  98 - 111 mmol/L Final   CO2 07/11/2022 19 (L)  22 - 32 mmol/L Final   Glucose, Bld 07/11/2022 91  70 - 99 mg/dL Final   Glucose reference range applies only to samples taken after fasting for at least 8 hours.   BUN 07/11/2022 31 (H)  6 - 20 mg/dL Final   Creatinine 07/11/2022 2.89 (H)  0.44 - 1.00 mg/dL Final   Calcium 07/11/2022 9.6  8.9 - 10.3 mg/dL Final   Total Protein 07/11/2022 10.7 (H)  6.5 - 8.1 g/dL Final   Albumin 07/11/2022 1.6 (L)  3.5 - 5.0 g/dL Final   AST 07/11/2022 24  15 - 41 U/L Final   ALT 07/11/2022 16  0 - 44 U/L Final   Alkaline Phosphatase 07/11/2022 69  38 - 126 U/L Final   Total Bilirubin 07/11/2022 0.6  0.3 - 1.2 mg/dL Final   GFR, Estimated 07/11/2022 19 (L)  >60 mL/min Final   Comment: (NOTE) Calculated using the CKD-EPI Creatinine Equation (2021)    Anion gap 07/11/2022 19 (H)  5 - 15 Final   Performed at [facility], [place] [address], [place], [place] [address]   WBC Count 07/11/2022 2.7 (L)  4.0 - 10.5 K/uL Final   RBC 07/11/2022 2.72 (L)  3.87 - 5.11 MIL/uL Final   Hemoglobin 07/11/2022 7.6 (L)  12.0 -  15.0 g/dL Final   HCT 07/11/2022 24.7 (L)  36.0 - 46.0 % Final   MCV 07/11/2022 90.8  80.0 - 100.0 fL Final   MCH 07/11/2022 27.9  26.0 - 34.0 pg Final   MCHC 07/11/2022 30.8  30.0 - 36.0 g/dL Final   RDW 07/11/2022 18.6 (H)  11.5 - 15.5 % Final   Platelet Count 07/11/2022 93 (L)  150 - 400 K/uL Final   Comment: SPECIMEN CHECKED FOR CLOTS Immature Platelet Fraction may be clinically indicated, consider ordering this additional test [id number] CONSISTENT WITH PREVIOUS RESULT REPEATED TO VERIFY    nRBC 07/11/2022 0.0  0.0 - 0.2 % Final   Neutrophils Relative % 07/11/2022 32  % Final   Neutro Abs 07/11/2022 0.9 (L)  1.7 - 7.7 K/uL Final   Lymphocytes Relative 07/11/2022 53  % Final   Lymphs Abs 07/11/2022 1.5  0.7 - 4.0 K/uL Final   Monocytes Relative 07/11/2022 6  % Final   Monocytes  Absolute 07/11/2022 0.2  0.1 - 1.0 K/uL Final   Eosinophils Relative 07/11/2022 2  % Final   Eosinophils Absolute 07/11/2022 0.0  0.0 - 0.5 K/uL Final   Basophils Relative 07/11/2022 1  % Final   Basophils Absolute 07/11/2022 0.0  0.0 - 0.1 K/uL Final   Immature Granulocytes 07/11/2022 6  % Final   Abs Immature Granulocytes 07/11/2022 0.17 (H)  0.00 - 0.07 K/uL Final   Rouleaux 07/11/2022 PRESENT   Final   Performed at [facility], [place] [address] [place], [place] [address]   IgG (Immunoglobin G), Serum 07/11/2022 188 (L)  586 - 1,602 mg/dL Final   Result confirmed on concentration.   IgA 07/11/2022 4,897 (H)  87 - 352 mg/dL Final   Comment: (NOTE) Results confirmed on dilution.    IgM (Immunoglobulin M), Srm 07/11/2022 15 (L)  26 - 217 mg/dL Final   Result confirmed on concentration.   Total Protein ELP 07/11/2022 11.0 (H)  6.0 - 8.5 g/dL Corrected   **Verified by repeat analysis**   Albumin SerPl Elph-Mcnc 07/11/2022 2.5 (L)  2.9 - 4.4 g/dL Corrected   Alpha 1 07/11/2022 0.3  0.0 - 0.4 g/dL Corrected   Alpha2 Glob SerPl Elph-Mcnc 07/11/2022 0.5  0.4 - 1.0 g/dL Corrected   B-Globulin SerPl Elph-Mcnc 07/11/2022 7.4 (H)  0.7 - 1.3 g/dL Corrected   Gamma Glob SerPl Elph-Mcnc 07/11/2022 0.3 (L)  0.4 - 1.8 g/dL Corrected   M Protein SerPl Elph-Mcnc 07/11/2022 6.8 (H)  Not Observed g/dL Corrected   Globulin, Total 07/11/2022 8.5 (H)  2.2 - 3.9 g/dL Corrected   Albumin/Glob SerPl 07/11/2022 0.3 (L)  0.7 - 1.7 Corrected   IFE 1 07/11/2022 Comment (A)   Corrected   Comment: (NOTE) Immunofixation shows IgA monoclonal protein with kappa light chain specificity.    Please Note 07/11/2022 Comment   Corrected   Comment: (NOTE) Protein electrophoresis scan will follow via computer, mail, or courier delivery. Performed At: [facility] [place], [place] [id number] Rush Farmer MD FA:[id number]    Kappa free light chain 07/11/2022 4,067.3 (H)  3.3  - 19.4 mg/L Final   Lambda free light chains 07/11/2022 2.8 (L)  5.7 - 26.3 mg/L Final   Kappa, lambda light chain ratio 07/11/2022 1,452.61 (H)  0.26 - 1.65 Final   Comment: (NOTE) Performed At: [facility] [place], [place] [id number] Rush Farmer MD [id number]     RADIOGRAPHIC STUDIES: I have personally reviewed the radiological images  as listed and agree with the findings in the report  No results found.  ASSESSMENT/PLAN 56 year old female who on June 14 2022 presented with acute renal failure, hypercalcemia, elevated total protein in setting of hypoalbuminemia, cytopenias (anemia, thrombocytopenia), lytic bone lesions which has lead to the diagnosis of multiple myeloma.    Multiple myeloma ISS-Stage III, Biclonal IgA kappa + kappa light chain escape/ FISH with TP 53 deletion and Cyclin D1/IgH fusion:  Diagnosed in October 2023 after patient presented with hypercalcemia, AKI, wide spread lytic lesions in skeleton, cytopenias.  At presentation serum free kappa 3067 lambda 2.3 with a kappa lambda 1333. IgA >6400 Blood viscosity 5.0  24 hr urine showed loss of 1582 mg protein.  UPEP  showed loss of 1023 mg of paraprotein.   BM Bx showed extensive involvement by plasma cells with kappa restriction.    Therapeutics:  Patient has high risk disease and is a stem cell transplant candidate  June 29, 2022: Cycle 1 Day 1 CyBorD.  Initiation of chemotherapy delayed due to insurance reasons.  Cycle 1 Day 8 interrupted because of hemothorax July 13 2022: Cycle 1 Day 8 CyBorD.  Labs notable for improvement in IgA M spike but increased kappa light chains.   July 20 2022:  Cycle 1 Vrd + Dara. Revlimid dose 10 mg daily Days 1 to 14 due to decreased renal function.  Will observe closely for side effects from Revlimid due to decreased renal fxn       Delirium:  Multifactorial with main components being medications and underlying illness.  Decreased renal  function prolonging medication clearance Reviewed medication list and recommended that MS contin, Gabapentin, Xyzal, clolopin, trazodone, zyrtec, scopolamine patch be held.  Will also hold Revlimid for the next day or so as it may be contributing.  As mental status improves will restart Revlimid and consider qod dosing.    Hypercalcemia:  Secondary to myeloma.  Was treated with IVF, calcitonin, Dexamethasone, Zometa. Should improve with continued therapy of myeloma.  To receive denosumab with chemotherapy.      Pulmonary HTN:  Has been reported in Multiple Myeloma.  Multifactorial can be related to caused by pulmonary vasculopathy, endothelial dysfunction, pulmonary vascular amyloidosis, hyperviscosity.  Noted to have hyperviscosity on labs drawn during the hospitalization.  Not hypoxemic so doubt PE  Cancer related pain:  Due to bone lesions from myeloma.  Will improve with treatment.   July 18 2022:  Added MS Contin 15 mg po bid.       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  Cytology from pleural fluid has been negative for plasma cellsUnderwent chest tube placement  Follow up CXR improved  Anemia:  Multifactorial.  Secondary to myeloma, renal insufficiency and bleeding (hemothorax)  Will follow Hgb and transfuse as needed for Hgb < 8 g/dL.  Phenotyping performed in preparation for starting daratumumab.  Arrange for PRBC's  Visual disturbances:  Likely due to hyperviscosity from myeloma.  For ophthalmology follow up  Deconditioning:  Arrange for PT by home health.  Patient is unable to leave home except for medical visits.    Social work consult:  To assist with disability paperwork    Cancer Staging  Multiple myeloma [facility]) Staging form: Plasma Cell Myeloma and Plasma Cell Disorders, AJCC 8th Edition - Clinical stage from  07/12/2022: RISS Stage III (Beta-2-microglobulin (mg/L):  17.7, Albumin (g/dL): 2.9, ISS: Stage III, High-risk cytogenetics: Present, LDH: Normal) - Signed by Barbee Cough, MD on 07/13/2022 Histopathologic type: Multiple myeloma Stage prefix: Initial diagnosis Beta 2 microglobulin range (mg/L): Greater than or equal to 5.5 Albumin range (g/dL): Less than 3.5 Cytogenetics: 17p deletion, t(11;14) translocation Lactate dehydrogenase (LDH) (U/L): 133 Serum calcium level: Elevated Pre-operative calcium level (mg/dL): 13.3 Serum creatinine level: Elevated Creatinine (mg/dL): 3.3 Bone disease on imaging: Present Hemoglobin (Hgb) (g/dL): 6.3 Pretreatment IgG (mg/dL): 215 Pretreatment IgA (mg/dL): 6,400 Pretreatment IgM (mg/dL): 13 Pretreatment monoclonal protein level in serum (M spike) (g/dL): 6 Pretreatment monoclonal protein level in 24-hour urine (M spike) (g): 1 Stage used in treatment planning: Yes National guidelines used in treatment planning: Yes   No problem-specific Assessment & Plan notes found for this encounter.   No orders of the defined types were placed in this encounter.    All questions were answered. The patient knows to call the clinic with any problems, questions or concerns.  This note was electronically signed.    Barbee Cough, MD  07/24/2022 8:38 AM

## 2022-07-25 ENCOUNTER — Other Ambulatory Visit: Payer: Self-pay

## 2022-07-25 DIAGNOSIS — C9000 Multiple myeloma not having achieved remission: Secondary | ICD-10-CM

## 2022-07-25 LAB — TYPE AND SCREEN
ABO/RH(D): O POS
Antibody Screen: POSITIVE
Unit division: 0
Unit division: 0

## 2022-07-25 LAB — BPAM RBC
Blood Product Expiration Date: 202312192359
Blood Product Expiration Date: 202312252359
ISSUE DATE / TIME: 202311200845
ISSUE DATE / TIME: 202311200845
Unit Type and Rh: 5100
Unit Type and Rh: 5100

## 2022-07-25 MED FILL — Bortezomib For Inj 3.5 MG: INTRAMUSCULAR | Qty: 1 | Status: AC

## 2022-07-25 MED FILL — Daratumumab-Hyaluronidase-fihj Inj 1800-30000 MG-Unit/15ML: SUBCUTANEOUS | Qty: 15 | Status: AC

## 2022-07-26 ENCOUNTER — Inpatient Hospital Stay: Payer: 59

## 2022-07-26 ENCOUNTER — Inpatient Hospital Stay (INDEPENDENT_AMBULATORY_CARE_PROVIDER_SITE_OTHER): Payer: 59 | Admitting: Oncology

## 2022-07-26 ENCOUNTER — Other Ambulatory Visit: Payer: 59

## 2022-07-26 ENCOUNTER — Other Ambulatory Visit: Payer: Self-pay | Admitting: Pharmacist

## 2022-07-26 ENCOUNTER — Other Ambulatory Visit: Payer: Self-pay

## 2022-07-26 VITALS — BP 135/71 | HR 103 | Temp 97.4°F | Resp 16

## 2022-07-26 DIAGNOSIS — C9000 Multiple myeloma not having achieved remission: Secondary | ICD-10-CM

## 2022-07-26 DIAGNOSIS — Z5112 Encounter for antineoplastic immunotherapy: Secondary | ICD-10-CM | POA: Diagnosis not present

## 2022-07-26 DIAGNOSIS — F05 Delirium due to known physiological condition: Secondary | ICD-10-CM

## 2022-07-26 DIAGNOSIS — Z09 Encounter for follow-up examination after completed treatment for conditions other than malignant neoplasm: Secondary | ICD-10-CM

## 2022-07-26 DIAGNOSIS — D759 Disease of blood and blood-forming organs, unspecified: Secondary | ICD-10-CM | POA: Diagnosis not present

## 2022-07-26 DIAGNOSIS — Z7189 Other specified counseling: Secondary | ICD-10-CM

## 2022-07-26 DIAGNOSIS — J942 Hemothorax: Secondary | ICD-10-CM

## 2022-07-26 LAB — BASIC METABOLIC PANEL
BUN: 27 — AB (ref 4–21)
BUN: 27 — AB (ref 4–21)
CO2: 16 (ref 13–22)
CO2: 16 (ref 13–22)
Chloride: 100 (ref 99–108)
Chloride: 100 (ref 99–108)
Creatinine: 3.1 — AB (ref 0.5–1.1)
Creatinine: 3.1 — AB (ref 0.5–1.1)
Glucose: 98
Glucose: 98
Potassium: 3.5 mEq/L (ref 3.5–5.1)
Potassium: 3.5 mEq/L (ref 3.5–5.1)
Sodium: 134 — AB (ref 137–147)
Sodium: 134 — AB (ref 137–147)

## 2022-07-26 LAB — COMPREHENSIVE METABOLIC PANEL
Albumin: 2.9 — AB (ref 3.5–5.0)
Albumin: 2.9 — AB (ref 3.5–5.0)
Calcium: 8.3 — AB (ref 8.7–10.7)
Calcium: 8.3 — AB (ref 8.7–10.7)

## 2022-07-26 LAB — HEPATIC FUNCTION PANEL
ALT: 28 U/L (ref 7–35)
AST: 34 (ref 13–35)
Alkaline Phosphatase: 128 — AB (ref 25–125)
Bilirubin, Total: 0.9

## 2022-07-26 LAB — CBC AND DIFFERENTIAL
HCT: 27 — AB (ref 36–46)
HCT: 27 — AB (ref 36–46)
Hemoglobin: 9.4 — AB (ref 12.0–16.0)
Hemoglobin: 9.4 — AB (ref 12.0–16.0)
Neutrophils Absolute: 1.52
Neutrophils Absolute: 1.52
Platelets: 35 10*3/uL — AB (ref 150–400)
Platelets: 35 10*3/uL — AB (ref 150–400)
WBC: 2.2
WBC: 2.2

## 2022-07-26 LAB — CBC
RBC: 3.17 — AB (ref 3.87–5.11)
RBC: 3.17 — AB (ref 3.87–5.11)

## 2022-07-26 MED ORDER — ACETAMINOPHEN 325 MG PO TABS
650.0000 mg | ORAL_TABLET | Freq: Once | ORAL | Status: AC
  Administered 2022-07-26: 650 mg via ORAL
  Filled 2022-07-26: qty 2

## 2022-07-26 MED ORDER — DIPHENHYDRAMINE HCL 25 MG PO CAPS
50.0000 mg | ORAL_CAPSULE | Freq: Once | ORAL | Status: AC
  Administered 2022-07-26: 50 mg via ORAL
  Filled 2022-07-26: qty 2

## 2022-07-26 MED ORDER — SODIUM CHLORIDE 0.9% FLUSH: 10.0000 mL | INTRAVENOUS | Status: DC | PRN

## 2022-07-26 MED ORDER — HEPARIN SOD (PORK) LOCK FLUSH 100 UNIT/ML IV SOLN: 500.0000 [IU] | Freq: Once | INTRAVENOUS | Status: DC | PRN

## 2022-07-26 MED ORDER — MONTELUKAST SODIUM 10 MG PO TABS
10.0000 mg | ORAL_TABLET | Freq: Once | ORAL | Status: AC
  Administered 2022-07-26: 10 mg via ORAL
  Filled 2022-07-26: qty 1

## 2022-07-26 MED ORDER — DARATUMUMAB-HYALURONIDASE-FIHJ 1800-30000 MG-UT/15ML ~~LOC~~ SOLN
1800.0000 mg | Freq: Once | SUBCUTANEOUS | Status: AC
  Administered 2022-07-26: 1800 mg via SUBCUTANEOUS
  Filled 2022-07-26: qty 15

## 2022-07-26 MED ORDER — DEXAMETHASONE 4 MG PO TABS
20.0000 mg | ORAL_TABLET | Freq: Once | ORAL | Status: AC
  Administered 2022-07-26: 20 mg via ORAL
  Filled 2022-07-26: qty 5

## 2022-07-26 MED ORDER — BORTEZOMIB CHEMO SQ INJECTION 3.5 MG (2.5MG/ML)
1.3000 mg/m2 | Freq: Once | INTRAMUSCULAR | Status: AC
  Administered 2022-07-26: 2.5 mg via SUBCUTANEOUS
  Filled 2022-07-26: qty 1

## 2022-07-26 MED ORDER — SODIUM CHLORIDE 0.9 % IV SOLN: Freq: Once | INTRAVENOUS | Status: AC

## 2022-07-26 MED FILL — Bortezomib For Inj 3.5 MG: INTRAMUSCULAR | Qty: 1 | Status: AC

## 2022-07-26 NOTE — Progress Notes (Signed)
[place] regimen with patient and family

## 2022-07-26 NOTE — Progress Notes (Signed)
[facility]  Visit:  Patient Care Team: Earlyne Iba, NP as PCP - General (Nurse Practitioner) Barbee Cough, MD as Consulting Physician (Internal Medicine)  CHIEF COMPLAINTS/PURPOSE OF CONSULTATION:  Oncology History  Multiple myeloma ([place])  06/20/2022 Initial Diagnosis   Multiple myeloma ([place])   06/29/2022 - 07/13/2022 Chemotherapy   Patient is on Treatment Plan : MULTIPLE MYELOMA CyBorD - Weekly Bortezomib     07/12/2022 Cancer Staging   Staging form: Plasma Cell Myeloma and Plasma Cell Disorders, AJCC 8th Edition - Clinical stage from 07/12/2022: RISS Stage III (Beta-2-microglobulin (mg/L): 17.7, Albumin (g/dL): 2.9, ISS: Stage III, High-risk cytogenetics: Present, LDH: Normal) - Signed by Barbee Cough, MD on 07/13/2022 Histopathologic type: Multiple myeloma Stage prefix: Initial diagnosis Beta 2 microglobulin range (mg/L): Greater than or equal to 5.5 Albumin range (g/dL): Less than 3.5 Cytogenetics: 17p deletion, t(11;14) translocation Lactate dehydrogenase (LDH) (U/L): 133 Serum calcium level: Elevated Pre-operative calcium level (mg/dL): 13.3 Serum creatinine level: Elevated Creatinine (mg/dL): 3.3 Bone disease on imaging: Present Hemoglobin (Hgb) (g/dL): 6.3 Pretreatment IgG (mg/dL): 215 Pretreatment IgA (mg/dL): 6,400 Pretreatment IgM (mg/dL): 13 Pretreatment monoclonal protein level in serum (M spike) (g/dL): 6 Pretreatment monoclonal protein level in 24-hour urine (M spike) (g): 1 Stage used in treatment planning: Yes National guidelines used in treatment planning: Yes   07/20/2022 -  Chemotherapy   Patient is on Treatment Plan : MYELOMA NEWLY DIAGNOSED TRANSPLANT CANDIDATE DaraVRd (Daratumumab SQ) q21d x 6 Cycles (Induction/Consolidation)       HISTORY OF PRESENTING ILLNESS: Kimberly Esparza 56 y.o. female is here because of  multiple myeloma Medical history notable for DHD, GERD, hypothyroidism, osteoarthritis,  fibromyalgia, stomach polyps  June 14, 2022 through June 19 2022: Admitted to [facility] following several weeks of feeling poorly.  She has been to urgent care on several occasions during the weeks prior with complaints of facial pain and headache.  On each occasion she was given antibiotics and on 1 occasion Medrol Dosepak was added.  Patient was able to see PCP this week who drew labs which were noted to be abnormal she was subsequently directed to the emergency room. CT head demonstrated multiple lytic lesions throughout the bony calvarium suggesting neoplastic process.  Largest of these was in the left frontal calvarium.  There is a soft tissue mass extending from this large lytic lesion measuring 3.8 x 3.3 cm in size coarse calcifications extending into the scalp and extra-axial location left frontal region.  INR 1.2 PTT 26.3 WBC 9.1 hemoglobin 6.3 MCV 98 platelet count 73; 54 segs 4 bands 35 lymphs 3 monos 2 eos 2S.  Chemistries notable for sodium 132 creatinine 3.30 glucose 122 calcium 13.3 corrected to 14.4 ionized calcium 2.35 albumin 2.9 total protein 12.2 AST 63 ALT 73  Immunofixation showed a biclonal IgA kappa protein Serum free kappa 3067 lambda 2.3 with a kappa lambda 1333.  IgG 215 IgA >6400 IgM 13 Blood viscosity 5.0  24 hr urine showed loss of 1582 mg protein.  UPEP  showed loss of 1023 mg of paraprotein    Hypercalcemia was treated with dexamethasone, IV hydration, symptomatic and calcitonin  June 15 2022: CT chest abdomen pelvis contrast:  Diffuse moth-eaten appearance of the skeleton with bilateral  pathologic rib fractures of various ages. Expansile 2 cm inferior left scapular lytic lesion. Small 2.0 cm soft tissue lesion in the right paraspinal region at the level of the fourth costovertebral  junction. Findings are compatible with  suspected multiple myeloma.  Small dependent left pleural effusion Trace pericardial effusion.  No lymphadenopathy.   June 16 2022: Port-A-Cath placed by radiology  June 17, 2022: Cardiac echo.  LVEF 60 to 65%.  Right atrium moderately dilated.  Moderate tricuspid valve regurg.  IVC dilated.  Pulmonary artery systolic pressure moderately elevated at 54 mmHg  June 19 2022: Right posterior iliac crest bone marrow biopsy and aspirate Extensive involvement by atypical plasma cells  representing 70% of all cells in the aspirate associated with variably sized aggregates and diffuse sheets in the clot and biopsy sections.  The plasma cells display kappa light chain restriction consistent with plasma cell neoplasm FISH analysis demonstrated a T p53 deletion as well as Cyclin D1/IgH fusion  June 22 2022:  Post hospital follow up for management of multiple myeloma.    Reviewed results of labs and bone marrow biopsy with patient and family.    June 28 2022:  Admitted to [facility] with bilateral pleural effusions, R >> L.  Therapeutic Right thoracocentesis yielded 2.2 liters of bloody pleural fluid with relief of symptoms.  Cytology showed no malignant cells  June 29, 2022: Discharged home so as to begin chemotherapy.  Cycle 1 day 1 CyBorD July 01 2022:  Presented to [facility] with SOB.   CT CAP showed  Large right hemothorax.  Trace left-sided pleural effusion, decreased in the interval.  New patchy ground-glass opacity anteriorly in the left upper lobe with nodular peripheral ground-glass opacity in the posterior left  upper lobe. Imaging features are nonspecific and may be  infectious/inflammatory Bibasilar collapse/consolidation, right greater than left and similar to 06/28/2022..  Numerous lucent lesions throughout the visualized bony anatomy  compatible with the reported clinical history of multiple myeloma.  Multiple bilateral rib fractures of varying age, as before. Superior endplate compression deformity at T9 and T12 is stable.  No acute findings in the abdomen or pelvis.  Right Chest tube  placed with recovery of bloody pleural fluid.  Labs notable for Hgb 4.8.  Patient was transferred to [facility] for acute management  July 02 2022:  Admitted to [facility]  Placed in ICU.  Chest tube removed as bloody output declined.   Transfused with 3 units PRBCs CT Chest without contrast showed  Right-sided chest tube in place with tip terminating in the right upper lobe with surrounding pulmonary hemorrhage. Similar to prior, the side-port terminates outside of the thoracic cavity. This tube is not well positioned to drain the remaining right-sided pleural fluid.  Moderate sized right hydropneumothorax.    July 08 2022:  WBC 2.6 Hgb 8.2 PLT 69; 39 seg 51 lymph 7 mono 2 eos 1 baso.  Cr 2.37   July 11 2022:  Post hospital follow up.  Having considerable back pain.  Also has pain in  right chest which was site of chest tube.  Fatigued.  Not sleeping well due to pain.  Using narcotic analgesics sparingly because she is concerned about developing addiction.  Has oxycodone,  gabapentin, Trazadone for pain.  Asked her to provide a tally of how many oxycodone tablets she has.  She requested considering addition of a muscle relaxer.   Reviewed results of bone marrow, SPEP with IEP, free light chain and FISH results with patient and her family   WBC 2.7 hemoglobin 7.6 platelet count 93; 32 segs 53 lymphs 6 monos 2 eos 1 basophil 6 immature  Chemistry is notable for creatinine 2.89 albumin 1.6 total protein 10.7.  SPEP  with IEP demonstrated an M spike of 6.8 g/dL of paraprotein.  Serum free kappa 4673 lambda 2.8 with a kappa lambda 1453 IgG 188 IgA 4897 IgM 15  July 13 2022:  Cycle 1 Day 8 (delayed)  July 18 2022:  Scheduled follow up for management of multiple myeloma.  Reviewed results of labs with patient, her sister and mother.  She continues to have fatigue and rib pain.  Taking oxycodone sparingly.  Discussed the use of low-dose long-acting MS Contin.  Since last visit I have also contacted  Dr. Enzo Montgomery at [facility] to discuss autotransplant.  We will send records regarding patient.  Patient also having scotomas and blurry vision.  I contacted Dr. Dwana Melena from [facility] to arrange for a follow up.  There is concern that her visual disturbances are related to hyperviscosity from myeloma.  Needing help with disability paperwork.  Requesting home PT.    WBC 1.9 hemoglobin 7.3 platelet count 71 and RBC 1.6  July 19, 2022 received 1 unit of packed RBCs  July 20 2022:  Cycle 1 Day 1 Bortezomib/ Lenolidomide/Dex/ Dara July 21, 2022: WBC 1.7 hemoglobin 6.4 platelet count 62; 66 segs 20 lymphs 4 monos 2 eos  July 24, 2022:  Scheduled follow-up for management of multiple myeloma.  Had episodes of confusion since last visit which were exacerbated by taking 1/2 tablets of MS Contin 15 mg for pain (Hold).  Has also taken gabapentin.  (Hold) Have held xyzal since Friday.  Not taking clolopin, trazodone, zyrtec, scopolamine patch.  Here with husband and mother.  Husband does most of the talking.  Discussed medication induced delerium.  To receive 2 units of packed red blood cells today  Cycle 1 Day 4 Bortezomib/ Lenolidomide/Dex/ Dara  WBC 1.7 hemoglobin 10.3 platelet count 55; 53 segs 37 lymphs 4 monos 3 eos 1 basophil 2 immature granulocytes CMP notable for creatinine 3.03 calcium 7.8 albumin 1.6 total protein 11.4 calculated GFR 17   July 26 2022:  Scheduled follow up for management of multiple myeloma.  Has been weaned off MS Contin, gabapenin, Xyzal etc since last visit.  Mentation clearer.  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  Not requiring narcotic analgesics.  Has diarrhea due to d/c of morphine and continued use of cathartics.  Will hold cathartics.   Appetite remains diminished.  Has dysgeusia but no nausea/ emesis.   For chemotherapy today.  Receiving IVF today  Here with husband and mother   Review of Systems   Constitutional:  Positive for fatigue. Negative for chills and fever.       Has gained 4 lbs since last visit  HENT:   Negative for hearing loss, lump/mass, mouth sores, nosebleeds, sore throat, tinnitus, trouble swallowing and voice change.   Eyes:  Positive for eye problems. Negative for icterus.       Vision changes:  None  Respiratory:  Positive for chest tightness. Negative for cough, hemoptysis, shortness of breath and wheezing.        PND:  none Orthopnea:  none Has chest wall pain owing to myeloma   Cardiovascular:  Negative for chest pain, leg swelling and palpitations.       PND:  none Orthopnea:  none  Gastrointestinal:  Positive for constipation. Negative for abdominal distention, abdominal pain, blood in stool, diarrhea, nausea and vomiting.       Constipation improved with treatment of hypecalcemia  Endocrine: Negative for hot flashes.  Cold intolerance:  none Heat intolerance:  none  Genitourinary:  Negative for bladder incontinence, difficulty urinating, dysuria, frequency, hematuria and nocturia.   Musculoskeletal:  Positive for arthralgias and myalgias. Negative for back pain, gait problem, neck pain and neck stiffness.       Arthralgias in shoulders, ribs, back which makes sleeping difficult.  Uses Oxycodone infrequently since it does not provide much relief  Skin:  Negative for itching, rash and wound.  Neurological:  Positive for extremity weakness and headaches. Negative for dizziness, gait problem, light-headedness, numbness, seizures and speech difficulty.  Hematological:  Negative for adenopathy. Does not bruise/bleed easily.  Psychiatric/Behavioral:  Positive for decreased concentration and sleep disturbance. Negative for confusion and suicidal ideas. The patient is not nervous/anxious.        Thinking foggy despite improvement in hypercalcemia    MEDICAL HISTORY: Past Medical History:  Diagnosis Date   ADHD (attention deficit hyperactivity disorder)     Anxiety    Depression    GERD (gastroesophageal reflux disease)    Hypothyroidism    Multiple allergies    Osteoarthritis     SURGICAL HISTORY: Past Surgical History:  Procedure Laterality Date   ADENOIDECTOMY     BONE MARROW BIOPSY     CHEST TUBE INSERTION     Taken out after 3 days   KNEE SURGERY Bilateral    PARTIAL HYSTERECTOMY     SINOSCOPY     TONSILLECTOMY     TUBAL LIGATION     WRIST SURGERY Bilateral     SOCIAL HISTORY: Social History   Socioeconomic History   Marital status: Married    Spouse name: Carloyn Manner   Number of children: 2   Years of education: 14   Highest education level: Associate degree: academic program  Occupational History   Not on file  Tobacco Use   Smoking status: Never   Smokeless tobacco: Never  Vaping Use   Vaping Use: Never used  Substance and Sexual Activity   Alcohol use: Yes    Comment: rarely   Drug use: Never   Sexual activity: Not Currently    Partners: Male    Comment: Married but no interest  Other Topics Concern   Not on file  Social History Narrative   Not on file   Social Determinants of Health   Financial Resource Strain: Not on file  Food Insecurity: Not on file  Transportation Needs: Not on file  Physical Activity: Not on file  Stress: Not on file  Social Connections: Not on file  Intimate Partner Violence: Not on file    FAMILY HISTORY Family History  Problem Relation Age of Onset   Hypertension Mother    Diabetes Mother    Glaucoma Mother    Gout Mother    Congestive Heart Failure Mother    Allergic rhinitis Father    Thyroid disease Father    Allergic rhinitis Brother    Gout Brother     ALLERGIES:  is allergic to doxycycline and tape.  MEDICATIONS:  Current Outpatient Medications  Medication Sig Dispense Refill   acyclovir (ZOVIRAX) 400 MG tablet Take 1 tablet (400 mg total) by mouth 2 (two) times daily. 60 tablet 3   ASPIRIN 81 PO Take 81 mg by mouth daily.     azelastine (ASTELIN) 0.1 %  nasal spray Place 2 sprays into both nostrils 2 (two) times daily as needed for rhinitis. Use in each nostril as directed 30 mL 3   dexamethasone (DECADRON) 4 MG tablet  Take 4 mg by mouth daily.     dexamethasone (DECADRON) 4 MG tablet Take 8 mg po daily for 2 days after Cytoxan chemotherapy. 30 tablet 3   fluticasone (FLONASE) 50 MCG/ACT nasal spray Place 1 spray into the nose daily.     lenalidomide (REVLIMID) 10 MG capsule Take 1 capsule (10 mg total) by mouth daily. Take for 14 days, then hold for 7 days. Repeat every 21 days. 14 capsule 0   levothyroxine (SYNTHROID) 88 MCG tablet Take 88 mcg by mouth daily.  3   montelukast (SINGULAIR) 10 MG tablet Take 1 tablet by mouth daily.     ondansetron (ZOFRAN-ODT) 4 MG disintegrating tablet Take 4 mg by mouth every 8 (eight) hours as needed.     oxyCODONE (OXY IR/ROXICODONE) 5 MG immediate release tablet Take by mouth. Take 1 tablet (5 mg total) by mouth every 4 (four) hours as needed for Moderate pain (4-6) or Severe pain (7-10).     Wheat Dextrin (BENEFIBER PO) Take by mouth.     amoxicillin (AMOXIL) 500 MG tablet TAKE 4 TABLETS PRIOR TO DENTAL PROCEDURE AS DIRECTED. (Patient not taking: Reported on 07/26/2022)     DEXILANT 60 MG capsule Take 1 capsule by mouth daily. (Patient not taking: Reported on 07/26/2022)  0   Current Facility-Administered Medications  Medication Dose Route Frequency Provider Last Rate Last Admin   acetaminophen (TYLENOL) tablet 650 mg  650 mg Oral Once Mosher, Vida Roller A, PA-C        PHYSICAL EXAMINATION:  ECOG PERFORMANCE STATUS: 2 - Symptomatic, <50% confined to bed   Vitals:   07/26/22 1024  BP: (!) 142/66  Pulse: (!) 109  Resp: 16  Temp: (!) 97.5 F (36.4 C)  SpO2: 98%     Filed Weights   07/26/22 1024  Weight: 174 lb 6.4 oz (79.1 kg)      Physical Exam Vitals and nursing note reviewed.  Constitutional:      General: She is not in acute distress.    Appearance: Normal appearance. She is obese. She  is not toxic-appearing or diaphoretic.     Comments: More awake and alert  HENT:     Head: Normocephalic and atraumatic.     Right Ear: External ear normal.     Left Ear: External ear normal.     Nose: Nose normal. No congestion or rhinorrhea.     Mouth/Throat:     Mouth: Mucous membranes are moist.  Eyes:     General: No scleral icterus.    Extraocular Movements: Extraocular movements intact.     Conjunctiva/sclera: Conjunctivae normal.     Pupils: Pupils are equal, round, and reactive to light.  Cardiovascular:     Rate and Rhythm: Normal rate and regular rhythm.     Heart sounds: Normal heart sounds. No murmur heard.    No friction rub. No gallop.  Pulmonary:     Effort: Pulmonary effort is normal. No respiratory distress.     Breath sounds: Normal breath sounds. No stridor. No wheezing or rales.  Chest:     Chest wall: No tenderness.  Abdominal:     General: Bowel sounds are normal. There is no distension.     Palpations: Abdomen is soft.     Tenderness: There is no abdominal tenderness. There is no guarding or rebound.  Musculoskeletal:        General: No swelling, tenderness or deformity.     Cervical back: Normal range of motion and neck supple.  No rigidity or tenderness.     Right lower leg: No edema.     Left lower leg: No edema.  Lymphadenopathy:     Head:     Right side of head: No submental, submandibular, tonsillar, preauricular, posterior auricular or occipital adenopathy.     Left side of head: No submental, submandibular, tonsillar, preauricular, posterior auricular or occipital adenopathy.     Cervical: No cervical adenopathy.     Right cervical: No superficial, deep or posterior cervical adenopathy.    Left cervical: No superficial, deep or posterior cervical adenopathy.     Upper Body:     Right upper body: No supraclavicular, axillary, pectoral or epitrochlear adenopathy.     Left upper body: No supraclavicular, axillary, pectoral or epitrochlear  adenopathy.  Skin:    General: Skin is warm.     Coloration: Skin is not jaundiced.     Findings: No bruising, erythema or rash.  Neurological:     General: No focal deficit present.     Mental Status: She is alert and oriented to person, place, and time. Mental status is at baseline.     Cranial Nerves: No cranial nerve deficit.     Sensory: No sensory deficit.  Psychiatric:        Mood and Affect: Mood normal.        Behavior: Behavior normal.        Thought Content: Thought content normal.        Judgment: Judgment normal.      LABORATORY DATA: I have personally reviewed the data as listed:  Appointment on 07/24/2022  Component Date Value Ref Range Status   WBC Count 07/24/2022 1.7 (L)  4.0 - 10.5 K/uL Final   RBC 07/24/2022 3.62 (L)  3.87 - 5.11 MIL/uL Final   Hemoglobin 07/24/2022 10.3 (L)  12.0 - 15.0 g/dL Final   Comment: REPEATED TO VERIFY POST TRANSFUSION SPECIMEN    HCT 07/24/2022 32.1 (L)  36.0 - 46.0 % Final   MCV 07/24/2022 88.7  80.0 - 100.0 fL Final   MCH 07/24/2022 28.5  26.0 - 34.0 pg Final   MCHC 07/24/2022 32.1  30.0 - 36.0 g/dL Final   RDW 07/24/2022 17.5 (H)  11.5 - 15.5 % Final   Platelet Count 07/24/2022 55 (L)  150 - 400 K/uL Final   Comment: Immature Platelet Fraction may be clinically indicated, consider ordering this additional test [id number] CONSISTENT WITH PREVIOUS RESULT REPEATED TO VERIFY    nRBC 07/24/2022 14.8 (H)  0.0 - 0.2 % Final   Neutrophils Relative % 07/24/2022 53  % Final   Neutro Abs 07/24/2022 0.9 (L)  1.7 - 7.7 K/uL Final   Lymphocytes Relative 07/24/2022 37  % Final   Lymphs Abs 07/24/2022 0.6 (L)  0.7 - 4.0 K/uL Final   Monocytes Relative 07/24/2022 4  % Final   Monocytes Absolute 07/24/2022 0.1  0.1 - 1.0 K/uL Final   Eosinophils Relative 07/24/2022 3  % Final   Eosinophils Absolute 07/24/2022 0.1  0.0 - 0.5 K/uL Final   Basophils Relative 07/24/2022 1  % Final   Basophils Absolute 07/24/2022 0.0  0.0 - 0.1 K/uL Final    WBC Morphology 07/24/2022 VACUOLATED NEUTROPHILS   Final   RBC Morphology 07/24/2022 MORPHOLOGY UNREMARKABLE   Final   Smear Review 07/24/2022 MORPHOLOGY UNREMARKABLE   Final   Immature Granulocytes 07/24/2022 2  % Final   Abs Immature Granulocytes 07/24/2022 0.03  0.00 - 0.07 K/uL Final   Rouleaux  07/24/2022 PRESENT   Final   Performed at [facility], [place] [address], [place], [place] [address]   Sodium 07/24/2022 135  135 - 145 mmol/L Final   Potassium 07/24/2022 3.7  3.5 - 5.1 mmol/L Final   Chloride 07/24/2022 102  98 - 111 mmol/L Final   CO2 07/24/2022 20 (L)  22 - 32 mmol/L Final   Glucose, Bld 07/24/2022 82  70 - 99 mg/dL Final   Glucose reference range applies only to samples taken after fasting for at least 8 hours.   BUN 07/24/2022 31 (H)  6 - 20 mg/dL Final   Creatinine 07/24/2022 3.03 (HH)  0.44 - 1.00 mg/dL Final   CRITICAL RESULT CALLED TO, READ BACK BY AND VERIFIED WITH LEWIS, MD 07/24/22 1837 J. COLE   Calcium 07/24/2022 7.8 (L)  8.9 - 10.3 mg/dL Final   Total Protein 07/24/2022 11.4 (H)  6.5 - 8.1 g/dL Final   Albumin 07/24/2022 1.6 (L)  3.5 - 5.0 g/dL Final   AST 07/24/2022 38  15 - 41 U/L Final   ALT 07/24/2022 31  0 - 44 U/L Final   Alkaline Phosphatase 07/24/2022 51  38 - 126 U/L Final   Total Bilirubin 07/24/2022 0.9  0.3 - 1.2 mg/dL Final   GFR, Estimated 07/24/2022 17 (L)  >60 mL/min Final   Comment: (NOTE) Calculated using the CKD-EPI Creatinine Equation (2021)    Anion gap 07/24/2022 13  5 - 15 Final   Performed at [facility], [place] [address] [place], [place] [address]   Blood Bank Specimen 07/24/2022 SAMPLE AVAILABLE FOR TESTING   Final   Sample Expiration 07/24/2022    Final                   Value:07/27/2022,2359 Performed at [facility], [place] [address] [place], [place] [address]   Clinical Support on 07/21/2022  Component Date Value Ref Range Status   WBC Count 07/21/2022 1.7 (L)  4.0 -  10.5 K/uL Final   RBC 07/21/2022 2.29 (L)  3.87 - 5.11 MIL/uL Final   Hemoglobin 07/21/2022 6.4 (LL)  12.0 - 15.0 g/dL Final   Comment: REPEATED TO VERIFY THIS CRITICAL RESULT HAS VERIFIED AND BEEN CALLED TO DUNLAP,K. RN BY NICOLE MCCOY ON 11 17 2023 AT 40, AND HAS BEEN READ BACK. CRITICA L RESULT VERIFIED    HCT 07/21/2022 20.4 (L)  36.0 - 46.0 % Final   MCV 07/21/2022 89.1  80.0 - 100.0 fL Final   MCH 07/21/2022 27.9  26.0 - 34.0 pg Final   MCHC 07/21/2022 31.4  30.0 - 36.0 g/dL Final   RDW 07/21/2022 18.1 (H)  11.5 - 15.5 % Final   Platelet Count 07/21/2022 62 (L)  150 - 400 K/uL Final   Comment: Immature Platelet Fraction may be clinically indicated, consider ordering this additional test [id number] CONSISTENT WITH PREVIOUS RESULT    nRBC 07/21/2022 1.8 (H)  0.0 - 0.2 % Final   Neutrophils Relative % 07/21/2022 66  % Final   Neutro Abs 07/21/2022 1.1 (L)  1.7 - 7.7 K/uL Final   Lymphocytes Relative 07/21/2022 28  % Final   Lymphs Abs 07/21/2022 0.5 (L)  0.7 - 4.0 K/uL Final   Monocytes Relative 07/21/2022 4  % Final   Monocytes Absolute 07/21/2022 0.1  0.1 - 1.0 K/uL Final   Eosinophils Relative 07/21/2022 0  % Final   Eosinophils Absolute 07/21/2022 0.0  0.0 - 0.5 K/uL Final  Basophils Relative 07/21/2022 0  % Final   Basophils Absolute 07/21/2022 0.0  0.0 - 0.1 K/uL Final   Immature Granulocytes 07/21/2022 2  % Final   Abs Immature Granulocytes 07/21/2022 0.04  0.00 - 0.07 K/uL Final   Rouleaux 07/21/2022 PRESENT   Final   Performed at [facility], [place] [address] [place], [place] [address]   Blood Bank Specimen 07/21/2022 SAMPLE AVAILABLE FOR TESTING   Final   Sample Expiration 07/21/2022    Final                   Value:07/24/2022,2359 Performed at [facility], [place] [address] [place], [place] [address]    ABO/RH(D) 07/21/2022 O POS   Final   Antibody Screen 07/21/2022 POS   Final   Sample Expiration 07/21/2022 07/24/2022,2359    Final   Antibody Identification [id number] NON SPECIFIC ANTIBODY REACTIVITY   Final   Unit Number 07/21/2022 [id number]   Final   Blood Component Type 07/21/2022 RED CELLS,LR   Final   Unit division 07/21/2022 00   Final   Status of Unit 07/21/2022 ISSUED,FINAL   Final   Transfusion Status 07/21/2022 OK TO TRANSFUSE   Final   Crossmatch Result 07/21/2022 COMPATIBLE   Final   Donor AG Type 07/21/2022 NEGATIVE FOR E ANTIGEN NEGATIVE FOR KELL ANTIGEN NEGATIVE FOR DUFFY A ANTIGEN NEGATIVE FOR S ANTIGEN   Final   Unit Number 07/21/2022 [id number]   Final   Blood Component Type 07/21/2022 RED CELLS,LR   Final   Unit division 07/21/2022 00   Final   Status of Unit 07/21/2022 ISSUED,FINAL   Final   Donor AG Type 07/21/2022 NEGATIVE FOR E ANTIGEN NEGATIVE FOR KELL ANTIGEN NEGATIVE FOR S ANTIGEN NEGATIVE FOR DUFFY A ANTIGEN   Final   Transfusion Status 07/21/2022 OK TO TRANSFUSE   Final   Crossmatch Result 07/21/2022 COMPATIBLE   Final   ISSUE DATE / TIME 07/21/2022 [id number]   Final   Blood Product Unit Number 07/21/2022 [id number]   Final   PRODUCT CODE 07/21/2022 [id number]   Final   Unit Type and Rh 07/21/2022 5100   Final   Blood Product Expiration Date 07/21/2022 [id number]   Final   ISSUE DATE / TIME 07/21/2022 [id number]   Final   Blood Product Unit Number 07/21/2022 [id number]   Final   PRODUCT CODE 07/21/2022 [id number]   Final   Unit Type and Rh 07/21/2022 5100   Final   Blood Product Expiration Date 07/21/2022 [id number]   Final   Order Confirmation 07/23/2022    Final                   Value:ORDER PROCESSED BY BLOOD BANK Performed at [facility], [place] [address], [place], [place] [address]   Office Visit on 07/18/2022  Component Date Value Ref Range Status   PT AG Type 07/12/2022    Final                   Value:POSITIVE FOR c ANTIGEN POSITIVE FOR C ANTIGEN NEGATIVE FOR E ANTIGEN POSITIVE FOR e ANTIGEN NEGATIVE FOR KELL ANTIGEN NEGATIVE FOR  DUFFY A ANTIGEN POSITIVE FOR DUFFY B ANTIGEN POSITIVE FOR KIDD A ANTIGEN POSITIVE FOR KIDD B ANTIGEN NEGATIVE FOR S ANTIGEN  POSITIVE FOR s ANTIGEN POSITIVE FOR M ANTIGEN Performed at [facility], [place] [address], [place], [place] [address]   Clinical Support on 07/18/2022  Component Date Value Ref Range  Status   ABO/RH(D) 07/18/2022 O POS   Final   Antibody Screen 07/18/2022 POS   Final   Sample Expiration 07/18/2022 07/21/2022,2359   Final   Antibody Identification 89/38/1017 ANTI E   Final   Unit Number 07/18/2022 [id number]   Final   Blood Component Type 07/18/2022 RED CELLS,LR   Final   Unit division 07/18/2022 00   Final   Status of Unit 07/18/2022 ISSUED,FINAL   Final   Donor AG Type 07/18/2022 NEGATIVE FOR E ANTIGEN   Final   Transfusion Status 07/18/2022 OK TO TRANSFUSE   Final   Crossmatch Result 07/18/2022 COMPATIBLE   Final   WBC Count 07/18/2022 1.9 (L)  4.0 - 10.5 K/uL Final   RBC 07/18/2022 2.54 (L)  3.87 - 5.11 MIL/uL Final   Hemoglobin 07/18/2022 7.3 (L)  12.0 - 15.0 g/dL Final   HCT 07/18/2022 23.5 (L)  36.0 - 46.0 % Final   MCV 07/18/2022 92.5  80.0 - 100.0 fL Final   MCH 07/18/2022 28.7  26.0 - 34.0 pg Final   MCHC 07/18/2022 31.1  30.0 - 36.0 g/dL Final   RDW 07/18/2022 18.3 (H)  11.5 - 15.5 % Final   Platelet Count 07/18/2022 71 (L)  150 - 400 K/uL Final   Comment: SPECIMEN CHECKED FOR CLOTS Immature Platelet Fraction may be clinically indicated, consider ordering this additional test [id number] CONSISTENT WITH PREVIOUS RESULT REPEATED TO VERIFY    nRBC 07/18/2022 1.6 (H)  0.0 - 0.2 % Final   Performed at [facility], [place] [address], [place], [place] [address]   WBC Count 07/19/2022 2.0 (L)  4.0 - 10.5 K/uL Final   RBC 07/19/2022 2.41 (L)  3.87 - 5.11 MIL/uL Final   Hemoglobin 07/19/2022 6.7 (LL)  12.0 - 15.0 g/dL Final   Comment: REPEATED TO VERIFY THIS CRITICAL RESULT HAS VERIFIED AND BEEN CALLED TO RN KATRINA D  BY ALEXIS CRUICKSHANK ON 11 16 2023 AT 1413, AND HAS BEEN READ BACK.     HCT 07/19/2022 22.2 (L)  36.0 - 46.0 % Final   MCV 07/19/2022 92.1  80.0 - 100.0 fL Final   MCH 07/19/2022 27.8  26.0 - 34.0 pg Final   MCHC 07/19/2022 30.2  30.0 - 36.0 g/dL Final   RDW 07/19/2022 18.1 (H)  11.5 - 15.5 % Final   Platelet Count 07/19/2022 66 (L)  150 - 400 K/uL Final   Comment: SPECIMEN CHECKED FOR CLOTS Immature Platelet Fraction may be clinically indicated, consider ordering this additional test [id number] CONSISTENT WITH PREVIOUS RESULT REPEATED TO VERIFY    nRBC 07/19/2022 1.0 (H)  0.0 - 0.2 % Final   Neutrophils Relative % 07/19/2022 32  % Final   Neutro Abs 07/19/2022 0.6 (L)  1.7 - 7.7 K/uL Final   Lymphocytes Relative 07/19/2022 49  % Final   Lymphs Abs 07/19/2022 1.0  0.7 - 4.0 K/uL Final   Monocytes Relative 07/19/2022 8  % Final   Monocytes Absolute 07/19/2022 0.2  0.1 - 1.0 K/uL Final   Eosinophils Relative 07/19/2022 3  % Final   Eosinophils Absolute 07/19/2022 0.1  0.0 - 0.5 K/uL Final   Basophils Relative 07/19/2022 1  % Final   Basophils Absolute 07/19/2022 0.0  0.0 - 0.1 K/uL Final   Immature Granulocytes 07/19/2022 7  % Final   Abs Immature Granulocytes 07/19/2022 0.13 (H)  0.00 - 0.07 K/uL Final   Rouleaux 07/19/2022 PRESENT   Final   Performed at [facility],  [place] [address] [place], [place] [address]   Order Confirmation 07/19/2022    Final                   Value:ORDER PROCESSED BY BLOOD BANK Performed at [facility], [place] [address], [place], [place] [address]    ISSUE DATE / TIME 07/18/2022 [id number]   Final   Blood Product Unit Number 07/18/2022 [id number]   Final   PRODUCT CODE 07/18/2022 [id number]   Final   Unit Type and Rh 07/18/2022 5100   Final   Blood Product Expiration Date 07/18/2022 [id number]   Final  Lab on 07/12/2022  Component Date Value Ref Range Status   ABO/RH(D) 07/12/2022 O POS   Final   Antibody  Screen 07/12/2022 POS   Final   Sample Expiration 07/12/2022 07/15/2022,2359   Final   Antibody Identification 93/90/3009 ANTI E   Final   PT AG Type 07/12/2022 NEGATIVE FOR E ANTIGEN   Final   Unit Number 07/12/2022 [id number]   Final   Blood Component Type 07/12/2022 RED CELLS,LR   Final   Unit division 07/12/2022 00   Final   Status of Unit 07/12/2022 ISSUED,FINAL   Final   Donor AG Type 07/12/2022 NEGATIVE FOR E ANTIGEN   Final   Transfusion Status 07/12/2022 OK TO TRANSFUSE   Final   Crossmatch Result 07/12/2022 COMPATIBLE   Final   Order Confirmation 07/12/2022    Final                   Value:ORDER PROCESSED BY BLOOD BANK Performed at [facility], [place] [address], [place], [place] [address]    ISSUE DATE / TIME 07/12/2022 [id number]   Final   Blood Product Unit Number 07/12/2022 [id number]   Final   PRODUCT CODE 07/12/2022 [id number]   Final   Unit Type and Rh 07/12/2022 9500   Final   Blood Product Expiration Date 07/12/2022 [id number]   Final   ABO/RH(D) 07/12/2022    Final                   Value:O POS Performed at [facility], [place] [address], [place], [place] [address]   Appointment on 07/11/2022  Component Date Value Ref Range Status   Sodium 07/11/2022 138  135 - 145 mmol/L Final   Potassium 07/11/2022 4.4  3.5 - 5.1 mmol/L Final   Chloride 07/11/2022 100  98 - 111 mmol/L Final   CO2 07/11/2022 19 (L)  22 - 32 mmol/L Final   Glucose, Bld 07/11/2022 91  70 - 99 mg/dL Final   Glucose reference range applies only to samples taken after fasting for at least 8 hours.   BUN 07/11/2022 31 (H)  6 - 20 mg/dL Final   Creatinine 07/11/2022 2.89 (H)  0.44 - 1.00 mg/dL Final   Calcium 07/11/2022 9.6  8.9 - 10.3 mg/dL Final   Total Protein 07/11/2022 10.7 (H)  6.5 - 8.1 g/dL Final   Albumin 07/11/2022 1.6 (L)  3.5 - 5.0 g/dL Final   AST 07/11/2022 24  15 - 41 U/L Final   ALT 07/11/2022 16  0 - 44 U/L Final   Alkaline Phosphatase  07/11/2022 69  38 - 126 U/L Final   Total Bilirubin 07/11/2022 0.6  0.3 - 1.2 mg/dL Final   GFR, Estimated 07/11/2022 19 (L)  >60 mL/min Final   Comment: (NOTE) Calculated using the CKD-EPI Creatinine Equation (2021)    Anion gap  07/11/2022 19 (H)  5 - 15 Final   Performed at [facility], [place] [address], [place], [place] [address]   WBC Count 07/11/2022 2.7 (L)  4.0 - 10.5 K/uL Final   RBC 07/11/2022 2.72 (L)  3.87 - 5.11 MIL/uL Final   Hemoglobin 07/11/2022 7.6 (L)  12.0 - 15.0 g/dL Final   HCT 07/11/2022 24.7 (L)  36.0 - 46.0 % Final   MCV 07/11/2022 90.8  80.0 - 100.0 fL Final   MCH 07/11/2022 27.9  26.0 - 34.0 pg Final   MCHC 07/11/2022 30.8  30.0 - 36.0 g/dL Final   RDW 07/11/2022 18.6 (H)  11.5 - 15.5 % Final   Platelet Count 07/11/2022 93 (L)  150 - 400 K/uL Final   Comment: SPECIMEN CHECKED FOR CLOTS Immature Platelet Fraction may be clinically indicated, consider ordering this additional test [id number] CONSISTENT WITH PREVIOUS RESULT REPEATED TO VERIFY    nRBC 07/11/2022 0.0  0.0 - 0.2 % Final   Neutrophils Relative % 07/11/2022 32  % Final   Neutro Abs 07/11/2022 0.9 (L)  1.7 - 7.7 K/uL Final   Lymphocytes Relative 07/11/2022 53  % Final   Lymphs Abs 07/11/2022 1.5  0.7 - 4.0 K/uL Final   Monocytes Relative 07/11/2022 6  % Final   Monocytes Absolute 07/11/2022 0.2  0.1 - 1.0 K/uL Final   Eosinophils Relative 07/11/2022 2  % Final   Eosinophils Absolute 07/11/2022 0.0  0.0 - 0.5 K/uL Final   Basophils Relative 07/11/2022 1  % Final   Basophils Absolute 07/11/2022 0.0  0.0 - 0.1 K/uL Final   Immature Granulocytes 07/11/2022 6  % Final   Abs Immature Granulocytes 07/11/2022 0.17 (H)  0.00 - 0.07 K/uL Final   Rouleaux 07/11/2022 PRESENT   Final   Performed at [facility], [place] [address] [place], [place] [address]   IgG (Immunoglobin G), Serum 07/11/2022 188 (L)  586 - 1,602 mg/dL Final   Result confirmed on concentration.   IgA  07/11/2022 4,897 (H)  87 - 352 mg/dL Final   Comment: (NOTE) Results confirmed on dilution.    IgM (Immunoglobulin M), Srm 07/11/2022 15 (L)  26 - 217 mg/dL Final   Result confirmed on concentration.   Total Protein ELP 07/11/2022 11.0 (H)  6.0 - 8.5 g/dL Corrected   **Verified by repeat analysis**   Albumin SerPl Elph-Mcnc 07/11/2022 2.5 (L)  2.9 - 4.4 g/dL Corrected   Alpha 1 07/11/2022 0.3  0.0 - 0.4 g/dL Corrected   Alpha2 Glob SerPl Elph-Mcnc 07/11/2022 0.5  0.4 - 1.0 g/dL Corrected   B-Globulin SerPl Elph-Mcnc 07/11/2022 7.4 (H)  0.7 - 1.3 g/dL Corrected   Gamma Glob SerPl Elph-Mcnc 07/11/2022 0.3 (L)  0.4 - 1.8 g/dL Corrected   M Protein SerPl Elph-Mcnc 07/11/2022 6.8 (H)  Not Observed g/dL Corrected   Globulin, Total 07/11/2022 8.5 (H)  2.2 - 3.9 g/dL Corrected   Albumin/Glob SerPl 07/11/2022 0.3 (L)  0.7 - 1.7 Corrected   IFE 1 07/11/2022 Comment (A)   Corrected   Comment: (NOTE) Immunofixation shows IgA monoclonal protein with kappa light chain specificity.    Please Note 07/11/2022 Comment   Corrected   Comment: (NOTE) Protein electrophoresis scan will follow via computer, mail, or courier delivery. Performed At: [facility] [place], [place] [id number] Rush Farmer MD [id number]    Kappa free light chain 07/11/2022 4,067.3 (H)  3.3 - 19.4 mg/L Final   Lambda free light chains  07/11/2022 2.8 (L)  5.7 - 26.3 mg/L Final   Kappa, lambda light chain ratio 07/11/2022 1,452.61 (H)  0.26 - 1.65 Final   Comment: (NOTE) Performed At: [facility] [place], [place] [id number] Rush Farmer MD [id number]     RADIOGRAPHIC STUDIES: I have personally reviewed the radiological images as listed and agree with the findings in the report  No results found.  ASSESSMENT/PLAN 56 year old female who on June 14 2022 presented with acute renal failure, hypercalcemia, elevated total protein in setting of hypoalbuminemia,  cytopenias (anemia, thrombocytopenia), lytic bone lesions which has lead to the diagnosis of multiple myeloma.    Multiple myeloma ISS-Stage III, Biclonal IgA kappa + kappa light chain escape/ FISH with TP 53 deletion and Cyclin D1/IgH fusion:  Diagnosed in October 2023 after patient presented with hypercalcemia, AKI, wide spread lytic lesions in skeleton, cytopenias.  At presentation serum free kappa 3067 lambda 2.3 with a kappa lambda 1333. IgA >6400 Blood viscosity 5.0  24 hr urine showed loss of 1582 mg protein.  UPEP  showed loss of 1023 mg of paraprotein.   BM Bx showed extensive involvement by plasma cells with kappa restriction.    Therapeutics:  Patient has high risk disease and is a stem cell transplant candidate  June 29, 2022: Cycle 1 Day 1 CyBorD.  Initiation of chemotherapy delayed due to insurance reasons.  Cycle 1 Day 8 interrupted because of hemothorax July 13 2022: Cycle 1 Day 8 CyBorD.  Labs notable for improvement in IgA M spike but increased kappa light chains.   July 20 2022:  Cycle 1 Vrd + Dara. Revlimid dose 10 mg daily Days 1 to 14 due to decreased renal function.  Will observe closely for side effects from Revlimid due to decreased renal fxn    Hypercalcemia:  Secondary to myeloma.  Was treated with IVF, calcitonin, Dexamethasone, Zometa. Should improve with continued therapy of myeloma.  To receive denosumab with chemotherapy.       Pulmonary HTN:  Reported in Multiple Myeloma.  Multifactorial can be related to caused by pulmonary vasculopathy, endothelial dysfunction, pulmonary vascular amyloidosis, hyperviscosity.  Noted to have hyperviscosity on labs drawn during the hospitalization.  Not hypoxemic so doubt PE  Cancer related pain:  Due to bone lesions from myeloma.  Will improve with treatment.   July 18 2022:  Added MS Contin 15 mg po bid.       July 26 2022: Stopped MS Contin due to delirium   Delirium:  Multifactorial with main components being  medications and underlying illness.  Decreased renal function prolonging medication clearance At the last visit reviewed medication list and recommended that MS contin, Gabapentin, Xyzal, clolopin, trazodone, zyrtec, scopolamine patch be held.  Will also hold Revlimid for the next day or so as it may be contributing.  Since there has been improvement  will restart Revlimid and consider qod dosing.   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  Cytology from pleural fluid has been negative for plasma cellsUnderwent chest tube placement  Follow up CXR improved.  Hgb more stable and chest pain improved indicating that this problem is resolving   Anemia:  Multifactorial.  Secondary to myeloma, renal insufficiency and bleeding (hemothorax)  Will follow Hgb and transfuse as needed for Hgb < 8 g/dL.  Phenotyping performed in preparation for starting  daratumumab   Visual disturbances:  Likely due to hyperviscosity from myeloma.  Arranging for ophthalmology follow up  Deconditioning:  Arrange for PT by home health.  Patient is unable to leave home except for medical visits.    Social work consult:  To assist with disability paperwork    Cancer Staging  Multiple myeloma [facility]) Staging form: Plasma Cell Myeloma and Plasma Cell Disorders, AJCC 8th Edition - Clinical stage from 07/12/2022: RISS Stage III (Beta-2-microglobulin (mg/L): 17.7, Albumin (g/dL): 2.9, ISS: Stage III, High-risk cytogenetics: Present, LDH: Normal) - Signed by Barbee Cough, MD on 07/13/2022 Histopathologic type: Multiple myeloma Stage prefix: Initial diagnosis Beta 2 microglobulin range (mg/L): Greater than or equal to 5.5 Albumin range (g/dL): Less than 3.5 Cytogenetics: 17p deletion, t(11;14) translocation Lactate dehydrogenase (LDH) (U/L): 133 Serum calcium level:  Elevated Pre-operative calcium level (mg/dL): 13.3 Serum creatinine level: Elevated Creatinine (mg/dL): 3.3 Bone disease on imaging: Present Hemoglobin (Hgb) (g/dL): 6.3 Pretreatment IgG (mg/dL): 215 Pretreatment IgA (mg/dL): 6,400 Pretreatment IgM (mg/dL): 13 Pretreatment monoclonal protein level in serum (M spike) (g/dL): 6 Pretreatment monoclonal protein level in 24-hour urine (M spike) (g): 1 Stage used in treatment planning: Yes National guidelines used in treatment planning: Yes   No problem-specific Assessment & Plan notes found for this encounter.   No orders of the defined types were placed in this encounter.    All questions were answered. The patient knows to call the clinic with any problems, questions or concerns.  This note was electronically signed.    Barbee Cough, MD  07/26/2022 11:13 AM

## 2022-07-26 NOTE — Patient Instructions (Signed)
Bortezomib Injection What is this medication? BORTEZOMIB (bor TEZ oh mib) treats lymphoma. It may also be used to treat multiple myeloma, a type of bone marrow cancer. It works by blocking a protein that causes cancer cells to grow and multiply. This helps to slow or stop the spread of cancer cells. This medicine may be used for other purposes; ask your health care provider or pharmacist if you have questions. COMMON BRAND NAME(S): Velcade What should I tell my care team before I take this medication? They need to know if you have any of these conditions: Dehydration Diabetes Heart disease Liver disease Tingling of the fingers or toes or other nerve disorder An unusual or allergic reaction to bortezomib, other medications, foods, dyes, or preservatives If you or your partner are pregnant or trying to get pregnant Breastfeeding How should I use this medication? This medication is injected into a vein or under the skin. It is given by your care team in a hospital or clinic setting. Talk to your care team about the use of this medication in children. Special care may be needed. Overdosage: If you think you have taken too much of this medicine contact a poison control center or emergency room at once. NOTE: This medicine is only for you. Do not share this medicine with others. What if I miss a dose? Keep appointments for follow-up doses. It is important not to miss your dose. Call your care team if you are unable to keep an appointment. What may interact with this medication? Ketoconazole Rifampin This list may not describe all possible interactions. Give your health care provider a list of all the medicines, herbs, non-prescription drugs, or dietary supplements you use. Also tell them if you smoke, drink alcohol, or use illegal drugs. Some items may interact with your medicine. What should I watch for while using this medication? Your condition will be monitored carefully while you are  receiving this medication. You may need blood work while taking this medication. This medication may affect your coordination, reaction time, or judgment. Do not drive or operate machinery until you know how this medication affects you. Sit up or stand slowly to reduce the risk of dizzy or fainting spells. Drinking alcohol with this medication can increase the risk of these side effects. This medication may increase your risk of getting an infection. Call your care team for advice if you get a fever, chills, sore throat, or other symptoms of a cold or flu. Do not treat yourself. Try to avoid being around people who are sick. Check with your care team if you have severe diarrhea, nausea, and vomiting, or if you sweat a lot. The loss of too much body fluid may make it dangerous for you to take this medication. Talk to your care team if you may be pregnant. Serious birth defects can occur if you take this medication during pregnancy and for 7 months after the last dose. You will need a negative pregnancy test before starting this medication. Contraception is recommended while taking this medication and for 7 months after the last dose. Your care team can help you find the option that works for you. If your partner can get pregnant, use a condom during sex while taking this medication and for 4 months after the last dose. Do not breastfeed while taking this medication and for 2 months after the last dose. This medication may cause infertility. Talk to your care team if you are concerned about your fertility. What side effects   may I notice from receiving this medication? Side effects that you should report to your care team as soon as possible: Allergic reactions--skin rash, itching, hives, swelling of the face, lips, tongue, or throat Bleeding--bloody or black, tar-like stools, vomiting blood or brown material that looks like coffee grounds, red or dark brown urine, small red or purple spots on skin, unusual  bruising or bleeding Bleeding in the brain--severe headache, stiff neck, confusion, dizziness, change in vision, numbness or weakness of the face, arm, or leg, trouble speaking, trouble walking, vomiting Bowel blockage--stomach cramping, unable to have a bowel movement or pass gas, loss of appetite, vomiting Heart failure--shortness of breath, swelling of the ankles, feet, or hands, sudden weight gain, unusual weakness or fatigue Infection--fever, chills, cough, sore throat, wounds that don't heal, pain or trouble when passing urine, general feeling of discomfort or being unwell Liver injury--right upper belly pain, loss of appetite, nausea, light-colored stool, dark yellow or brown urine, yellowing skin or eyes, unusual weakness or fatigue Low blood pressure--dizziness, feeling faint or lightheaded, blurry vision Lung injury--shortness of breath or trouble breathing, cough, spitting up blood, chest pain, fever Pain, tingling, or numbness in the hands or feet Severe or prolonged diarrhea Stomach pain, bloody diarrhea, pale skin, unusual weakness or fatigue, decrease in the amount of urine, which may be signs of hemolytic uremic syndrome Sudden and severe headache, confusion, change in vision, seizures, which may be signs of posterior reversible encephalopathy syndrome (PRES) TTP--purple spots on the skin or inside the mouth, pale skin, yellowing skin or eyes, unusual weakness or fatigue, fever, fast or irregular heartbeat, confusion, change in vision, trouble speaking, trouble walking Tumor lysis syndrome (TLS)--nausea, vomiting, diarrhea, decrease in the amount of urine, dark urine, unusual weakness or fatigue, confusion, muscle pain or cramps, fast or irregular heartbeat, joint pain Side effects that usually do not require medical attention (report to your care team if they continue or are bothersome): Constipation Diarrhea Fatigue Loss of appetite Nausea This list may not describe all possible  side effects. Call your doctor for medical advice about side effects. You may report side effects to FDA at 1-800-FDA-1088. Where should I keep my medication? This medication is given in a hospital or clinic. It will not be stored at home. NOTE: This sheet is a summary. It may not cover all possible information. If you have questions about this medicine, talk to your doctor, pharmacist, or health care provider.  2023 [employer] Standard (2022-01-18 00:00:00) Daratumumab Injection What is this medication? DARATUMUMAB (dar a toom ue mab) treats multiple myeloma, a type of bone marrow cancer. It works by helping your immune system slow or stop the spread of cancer cells. It is a monoclonal antibody. This medicine may be used for other purposes; ask your health care provider or pharmacist if you have questions. COMMON BRAND NAME(S): DARZALEX What should I tell my care team before I take this medication? They need to know if you have any of these conditions: Hereditary fructose intolerance Infection, such as chickenpox, herpes, hepatitis B virus Lung or breathing disease, such as asthma, COPD An unusual or allergic reaction to daratumumab, sorbitol, other medications, foods, dyes, or preservatives Pregnant or trying to get pregnant Breast-feeding How should I use this medication? This medication is injected into a vein. It is given by your care team in a hospital or clinic setting. Talk to your care team about the use of this medication in children. Special care may be needed. Overdosage: If you think you  have taken too much of this medicine contact a poison control center or emergency room at once. NOTE: This medicine is only for you. Do not share this medicine with others. What if I miss a dose? Keep appointments for follow-up doses. It is important not to miss your dose. Call your care team if you are unable to keep an appointment. What may interact with this medication? Interactions have  not been studied. This list may not describe all possible interactions. Give your health care provider a list of all the medicines, herbs, non-prescription drugs, or dietary supplements you use. Also tell them if you smoke, drink alcohol, or use illegal drugs. Some items may interact with your medicine. What should I watch for while using this medication? Your condition will be monitored carefully while you are receiving this medication. This medication can cause serious allergic reactions. To reduce your risk, your care team may give you other medication to take before receiving this one. Be sure to follow the directions from your care team. This medication can affect the results of blood tests to match your blood type. These changes can last for up to 6 months after the final dose. Your care team will do blood tests to match your blood type before you start treatment. Tell all of your care team that you are being treated with this medication before receiving a blood transfusion. This medication can affect the results of some tests used to determine treatment response; extra tests may be needed to evaluate response. Talk to your care team if you wish to become pregnant or think you are pregnant. This medication can cause serious birth defects if taken during pregnancy and for 3 months after the last dose. A reliable form of contraception is recommended while taking this medication and for 3 months after the last dose. Talk to your care team about effective forms of contraception. Do not breast-feed while taking this medication. What side effects may I notice from receiving this medication? Side effects that you should report to your care team as soon as possible: Allergic reactions--skin rash, itching, hives, swelling of the face, lips, tongue, or throat Infection--fever, chills, cough, sore throat, wounds that don't heal, pain or trouble when passing urine, general feeling of discomfort or being  unwell Infusion reactions--chest pain, shortness of breath or trouble breathing, feeling faint or lightheaded Unusual bruising or bleeding Side effects that usually do not require medical attention (report to your care team if they continue or are bothersome): Constipation Diarrhea Fatigue Nausea Pain, tingling, or numbness in the hands or feet Swelling of the ankles, hands, or feet This list may not describe all possible side effects. Call your doctor for medical advice about side effects. You may report side effects to FDA at 1-800-FDA-1088. Where should I keep my medication? This medication is given in a hospital or clinic. It will not be stored at home. NOTE: This sheet is a summary. It may not cover all possible information. If you have questions about this medicine, talk to your doctor, pharmacist, or health care provider.  2023 [employer] Standard (2021-12-14 00:00:00)

## 2022-07-31 ENCOUNTER — Inpatient Hospital Stay: Payer: 59

## 2022-07-31 ENCOUNTER — Encounter: Payer: Self-pay | Admitting: Oncology

## 2022-07-31 ENCOUNTER — Other Ambulatory Visit: Payer: Self-pay | Admitting: Oncology

## 2022-07-31 VITALS — BP 129/64 | HR 90 | Temp 97.7°F | Resp 16 | Wt 173.0 lb

## 2022-07-31 DIAGNOSIS — C9000 Multiple myeloma not having achieved remission: Secondary | ICD-10-CM

## 2022-07-31 DIAGNOSIS — F05 Delirium due to known physiological condition: Secondary | ICD-10-CM | POA: Insufficient documentation

## 2022-07-31 DIAGNOSIS — Z5112 Encounter for antineoplastic immunotherapy: Secondary | ICD-10-CM | POA: Diagnosis not present

## 2022-07-31 MED ORDER — BORTEZOMIB CHEMO SQ INJECTION 3.5 MG (2.5MG/ML)
1.3000 mg/m2 | Freq: Once | INTRAMUSCULAR | Status: AC
  Administered 2022-07-31: 2.5 mg via SUBCUTANEOUS
  Filled 2022-07-31: qty 1

## 2022-07-31 MED ORDER — PROCHLORPERAZINE MALEATE 10 MG PO TABS
10.0000 mg | ORAL_TABLET | Freq: Once | ORAL | Status: AC
  Administered 2022-07-31: 10 mg via ORAL
  Filled 2022-07-31: qty 1

## 2022-07-31 NOTE — Patient Instructions (Signed)
Bortezomib Injection What is this medication? BORTEZOMIB (bor TEZ oh mib) treats lymphoma. It may also be used to treat multiple myeloma, a type of bone marrow cancer. It works by blocking a protein that causes cancer cells to grow and multiply. This helps to slow or stop the spread of cancer cells. This medicine may be used for other purposes; ask your health care provider or pharmacist if you have questions. COMMON BRAND NAME(S): Velcade What should I tell my care team before I take this medication? They need to know if you have any of these conditions: Dehydration Diabetes Heart disease Liver disease Tingling of the fingers or toes or other nerve disorder An unusual or allergic reaction to bortezomib, other medications, foods, dyes, or preservatives If you or your partner are pregnant or trying to get pregnant Breastfeeding How should I use this medication? This medication is injected into a vein or under the skin. It is given by your care team in a hospital or clinic setting. Talk to your care team about the use of this medication in children. Special care may be needed. Overdosage: If you think you have taken too much of this medicine contact a poison control center or emergency room at once. NOTE: This medicine is only for you. Do not share this medicine with others. What if I miss a dose? Keep appointments for follow-up doses. It is important not to miss your dose. Call your care team if you are unable to keep an appointment. What may interact with this medication? Ketoconazole Rifampin This list may not describe all possible interactions. Give your health care provider a list of all the medicines, herbs, non-prescription drugs, or dietary supplements you use. Also tell them if you smoke, drink alcohol, or use illegal drugs. Some items may interact with your medicine. What should I watch for while using this medication? Your condition will be monitored carefully while you are  receiving this medication. You may need blood work while taking this medication. This medication may affect your coordination, reaction time, or judgment. Do not drive or operate machinery until you know how this medication affects you. Sit up or stand slowly to reduce the risk of dizzy or fainting spells. Drinking alcohol with this medication can increase the risk of these side effects. This medication may increase your risk of getting an infection. Call your care team for advice if you get a fever, chills, sore throat, or other symptoms of a cold or flu. Do not treat yourself. Try to avoid being around people who are sick. Check with your care team if you have severe diarrhea, nausea, and vomiting, or if you sweat a lot. The loss of too much body fluid may make it dangerous for you to take this medication. Talk to your care team if you may be pregnant. Serious birth defects can occur if you take this medication during pregnancy and for 7 months after the last dose. You will need a negative pregnancy test before starting this medication. Contraception is recommended while taking this medication and for 7 months after the last dose. Your care team can help you find the option that works for you. If your partner can get pregnant, use a condom during sex while taking this medication and for 4 months after the last dose. Do not breastfeed while taking this medication and for 2 months after the last dose. This medication may cause infertility. Talk to your care team if you are concerned about your fertility. What side effects   may I notice from receiving this medication? Side effects that you should report to your care team as soon as possible: Allergic reactions--skin rash, itching, hives, swelling of the face, lips, tongue, or throat Bleeding--bloody or black, tar-like stools, vomiting blood or brown material that looks like coffee grounds, red or dark brown urine, small red or purple spots on skin, unusual  bruising or bleeding Bleeding in the brain--severe headache, stiff neck, confusion, dizziness, change in vision, numbness or weakness of the face, arm, or leg, trouble speaking, trouble walking, vomiting Bowel blockage--stomach cramping, unable to have a bowel movement or pass gas, loss of appetite, vomiting Heart failure--shortness of breath, swelling of the ankles, feet, or hands, sudden weight gain, unusual weakness or fatigue Infection--fever, chills, cough, sore throat, wounds that don't heal, pain or trouble when passing urine, general feeling of discomfort or being unwell Liver injury--right upper belly pain, loss of appetite, nausea, light-colored stool, dark yellow or brown urine, yellowing skin or eyes, unusual weakness or fatigue Low blood pressure--dizziness, feeling faint or lightheaded, blurry vision Lung injury--shortness of breath or trouble breathing, cough, spitting up blood, chest pain, fever Pain, tingling, or numbness in the hands or feet Severe or prolonged diarrhea Stomach pain, bloody diarrhea, pale skin, unusual weakness or fatigue, decrease in the amount of urine, which may be signs of hemolytic uremic syndrome Sudden and severe headache, confusion, change in vision, seizures, which may be signs of posterior reversible encephalopathy syndrome (PRES) TTP--purple spots on the skin or inside the mouth, pale skin, yellowing skin or eyes, unusual weakness or fatigue, fever, fast or irregular heartbeat, confusion, change in vision, trouble speaking, trouble walking Tumor lysis syndrome (TLS)--nausea, vomiting, diarrhea, decrease in the amount of urine, dark urine, unusual weakness or fatigue, confusion, muscle pain or cramps, fast or irregular heartbeat, joint pain Side effects that usually do not require medical attention (report to your care team if they continue or are bothersome): Constipation Diarrhea Fatigue Loss of appetite Nausea This list may not describe all possible  side effects. Call your doctor for medical advice about side effects. You may report side effects to FDA at 1-800-FDA-1088. Where should I keep my medication? This medication is given in a hospital or clinic. It will not be stored at home. NOTE: This sheet is a summary. It may not cover all possible information. If you have questions about this medicine, talk to your doctor, pharmacist, or health care provider.  2023 [employer] Standard (2022-01-18 00:00:00)  

## 2022-08-01 ENCOUNTER — Ambulatory Visit: Payer: 59 | Admitting: Oncology

## 2022-08-01 ENCOUNTER — Telehealth: Payer: Self-pay

## 2022-08-01 ENCOUNTER — Inpatient Hospital Stay: Payer: 59

## 2022-08-01 ENCOUNTER — Inpatient Hospital Stay (INDEPENDENT_AMBULATORY_CARE_PROVIDER_SITE_OTHER): Payer: 59 | Admitting: Oncology

## 2022-08-01 VITALS — BP 137/63 | HR 94 | Temp 97.9°F | Resp 20 | Ht 65.4 in | Wt 172.8 lb

## 2022-08-01 DIAGNOSIS — Z09 Encounter for follow-up examination after completed treatment for conditions other than malignant neoplasm: Secondary | ICD-10-CM | POA: Diagnosis not present

## 2022-08-01 DIAGNOSIS — C9000 Multiple myeloma not having achieved remission: Secondary | ICD-10-CM

## 2022-08-01 DIAGNOSIS — Z7189 Other specified counseling: Secondary | ICD-10-CM

## 2022-08-01 DIAGNOSIS — D630 Anemia in neoplastic disease: Secondary | ICD-10-CM | POA: Diagnosis not present

## 2022-08-01 DIAGNOSIS — G893 Neoplasm related pain (acute) (chronic): Secondary | ICD-10-CM | POA: Diagnosis not present

## 2022-08-01 DIAGNOSIS — F05 Delirium due to known physiological condition: Secondary | ICD-10-CM

## 2022-08-01 DIAGNOSIS — J942 Hemothorax: Secondary | ICD-10-CM

## 2022-08-01 DIAGNOSIS — Z5112 Encounter for antineoplastic immunotherapy: Secondary | ICD-10-CM | POA: Diagnosis not present

## 2022-08-01 LAB — CBC WITH DIFFERENTIAL (CANCER CENTER ONLY)
Abs Immature Granulocytes: 0 10*3/uL (ref 0.00–0.07)
Basophils Absolute: 0 10*3/uL (ref 0.0–0.1)
Basophils Relative: 1 %
Eosinophils Absolute: 0 10*3/uL (ref 0.0–0.5)
Eosinophils Relative: 3 %
HCT: 26.4 % — ABNORMAL LOW (ref 36.0–46.0)
Hemoglobin: 8.5 g/dL — ABNORMAL LOW (ref 12.0–15.0)
Immature Granulocytes: 0 %
Lymphocytes Relative: 34 %
Lymphs Abs: 0.4 10*3/uL — ABNORMAL LOW (ref 0.7–4.0)
MCH: 28.2 pg (ref 26.0–34.0)
MCHC: 32.2 g/dL (ref 30.0–36.0)
MCV: 87.7 fL (ref 80.0–100.0)
Monocytes Absolute: 0.1 10*3/uL (ref 0.1–1.0)
Monocytes Relative: 9 %
Neutro Abs: 0.6 10*3/uL — ABNORMAL LOW (ref 1.7–7.7)
Neutrophils Relative %: 53 %
Platelet Count: 21 10*3/uL — ABNORMAL LOW (ref 150–400)
RBC: 3.01 MIL/uL — ABNORMAL LOW (ref 3.87–5.11)
RDW: 18.7 % — ABNORMAL HIGH (ref 11.5–15.5)
WBC Count: 1.1 10*3/uL — ABNORMAL LOW (ref 4.0–10.5)
nRBC: 0 % (ref 0.0–0.2)

## 2022-08-01 LAB — CMP (CANCER CENTER ONLY)
ALT: 19 U/L (ref 0–44)
AST: 31 U/L (ref 15–41)
Albumin: 1.7 g/dL — ABNORMAL LOW (ref 3.5–5.0)
Alkaline Phosphatase: 45 U/L (ref 38–126)
Anion gap: 16 — ABNORMAL HIGH (ref 5–15)
BUN: 25 mg/dL — ABNORMAL HIGH (ref 6–20)
CO2: 16 mmol/L — ABNORMAL LOW (ref 22–32)
Calcium: 6.7 mg/dL — ABNORMAL LOW (ref 8.9–10.3)
Chloride: 96 mmol/L — ABNORMAL LOW (ref 98–111)
Creatinine: 2.44 mg/dL — ABNORMAL HIGH (ref 0.44–1.00)
GFR, Estimated: 23 mL/min — ABNORMAL LOW (ref >60)
Glucose, Bld: 73 mg/dL (ref 70–99)
Potassium: 3.2 mmol/L — ABNORMAL LOW (ref 3.5–5.1)
Sodium: 128 mmol/L — ABNORMAL LOW (ref 135–145)
Total Bilirubin: 0.8 mg/dL (ref 0.3–1.2)
Total Protein: 11.2 g/dL — ABNORMAL HIGH (ref 6.5–8.1)

## 2022-08-01 LAB — SAMPLE TO BLOOD BANK

## 2022-08-01 MED ORDER — LENALIDOMIDE 10 MG PO CAPS: 10.0000 mg | ORAL_CAPSULE | Freq: Every day | ORAL | 0 refills | Status: DC

## 2022-08-01 NOTE — Patient Instructions (Signed)
Please do not take Revlimid for the next several days so that we can assess if it is contributing to you nausea.  Please contact us on Thursday to let us know how you are doing off this medication

## 2022-08-01 NOTE — Telephone Encounter (Signed)
late entry from 07/25/2022. Gave husband new appt times for 07/26/2022. Pt to get fluids and possible consult with Dr. Orlene Erm.

## 2022-08-01 NOTE — Progress Notes (Signed)
[facility]  Visit:  Patient Care Team: Earlyne Iba, NP as PCP - General (Nurse Practitioner) Barbee Cough, MD as Consulting Physician (Internal Medicine)  CHIEF COMPLAINTS/PURPOSE OF CONSULTATION:  Oncology History  Multiple myeloma ([place])  06/20/2022 Initial Diagnosis   Multiple myeloma ([place])   06/29/2022 - 07/13/2022 Chemotherapy   Patient is on Treatment Plan : MULTIPLE MYELOMA CyBorD - Weekly Bortezomib     07/12/2022 Cancer Staging   Staging form: Plasma Cell Myeloma and Plasma Cell Disorders, AJCC 8th Edition - Clinical stage from 07/12/2022: RISS Stage III (Beta-2-microglobulin (mg/L): 17.7, Albumin (g/dL): 2.9, ISS: Stage III, High-risk cytogenetics: Present, LDH: Normal) - Signed by Barbee Cough, MD on 07/13/2022 Histopathologic type: Multiple myeloma Stage prefix: Initial diagnosis Beta 2 microglobulin range (mg/L): Greater than or equal to 5.5 Albumin range (g/dL): Less than 3.5 Cytogenetics: 17p deletion, t(11;14) translocation Lactate dehydrogenase (LDH) (U/L): 133 Serum calcium level: Elevated Pre-operative calcium level (mg/dL): 13.3 Serum creatinine level: Elevated Creatinine (mg/dL): 3.3 Bone disease on imaging: Present Hemoglobin (Hgb) (g/dL): 6.3 Pretreatment IgG (mg/dL): 215 Pretreatment IgA (mg/dL): 6,400 Pretreatment IgM (mg/dL): 13 Pretreatment monoclonal protein level in serum (M spike) (g/dL): 6 Pretreatment monoclonal protein level in 24-hour urine (M spike) (g): 1 Stage used in treatment planning: Yes National guidelines used in treatment planning: Yes   07/20/2022 -  Chemotherapy   Patient is on Treatment Plan : MYELOMA NEWLY DIAGNOSED TRANSPLANT CANDIDATE DaraVRd (Daratumumab SQ) q21d x 6 Cycles (Induction/Consolidation)       HISTORY OF PRESENTING ILLNESS: Kimberly Esparza 56 y.o. female is here because of  multiple myeloma Medical history notable for DHD, GERD, hypothyroidism, osteoarthritis,  fibromyalgia, stomach polyps  June 14, 2022 through June 19 2022: Admitted to [facility] following several weeks of feeling poorly.  She has been to urgent care on several occasions during the weeks prior with complaints of facial pain and headache.  On each occasion she was given antibiotics and on 1 occasion Medrol Dosepak was added.  Patient was able to see PCP this week who drew labs which were noted to be abnormal she was subsequently directed to the emergency room. CT head demonstrated multiple lytic lesions throughout the bony calvarium suggesting neoplastic process.  Largest of these was in the left frontal calvarium.  There is a soft tissue mass extending from this large lytic lesion measuring 3.8 x 3.3 cm in size coarse calcifications extending into the scalp and extra-axial location left frontal region.  INR 1.2 PTT 26.3 WBC 9.1 hemoglobin 6.3 MCV 98 platelet count 73; 54 segs 4 bands 35 lymphs 3 monos 2 eos 2S.  Chemistries notable for sodium 132 creatinine 3.30 glucose 122 calcium 13.3 corrected to 14.4 ionized calcium 2.35 albumin 2.9 total protein 12.2 AST 63 ALT 73  Immunofixation showed a biclonal IgA kappa protein Serum free kappa 3067 lambda 2.3 with a kappa lambda 1333.  IgG 215 IgA >6400 IgM 13 Blood viscosity 5.0  24 hr urine showed loss of 1582 mg protein.  UPEP  showed loss of 1023 mg of paraprotein    Hypercalcemia was treated with dexamethasone, IV hydration, symptomatic and calcitonin  June 15 2022: CT chest abdomen pelvis contrast:  Diffuse moth-eaten appearance of the skeleton with bilateral  pathologic rib fractures of various ages. Expansile 2 cm inferior left scapular lytic lesion. Small 2.0 cm soft tissue lesion in the right paraspinal region at the level of the fourth costovertebral  junction. Findings are compatible with  suspected multiple myeloma.  Small dependent left pleural effusion Trace pericardial effusion.  No lymphadenopathy.   June 16 2022: Port-A-Cath placed by radiology  June 17, 2022: Cardiac echo.  LVEF 60 to 65%.  Right atrium moderately dilated.  Moderate tricuspid valve regurg.  IVC dilated.  Pulmonary artery systolic pressure moderately elevated at 54 mmHg  June 19 2022: Right posterior iliac crest bone marrow biopsy and aspirate Extensive involvement by atypical plasma cells  representing 70% of all cells in the aspirate associated with variably sized aggregates and diffuse sheets in the clot and biopsy sections.  The plasma cells display kappa light chain restriction consistent with plasma cell neoplasm FISH analysis demonstrated a T p53 deletion as well as Cyclin D1/IgH fusion  June 22 2022:  Post hospital follow up for management of multiple myeloma.    Reviewed results of labs and bone marrow biopsy with patient and family.    June 28 2022:  Admitted to [facility] with bilateral pleural effusions, R >> L.  Therapeutic Right thoracocentesis yielded 2.2 liters of bloody pleural fluid with relief of symptoms.  Cytology showed no malignant cells  June 29, 2022: Discharged home so as to begin chemotherapy.  Cycle 1 day 1 CyBorD July 01 2022:  Presented to [facility] with SOB.   CT CAP showed  Large right hemothorax.  Trace left-sided pleural effusion, decreased in the interval.  New patchy ground-glass opacity anteriorly in the left upper lobe with nodular peripheral ground-glass opacity in the posterior left  upper lobe. Imaging features are nonspecific and may be  infectious/inflammatory Bibasilar collapse/consolidation, right greater than left and similar to 06/28/2022..  Numerous lucent lesions throughout the visualized bony anatomy  compatible with the reported clinical history of multiple myeloma.  Multiple bilateral rib fractures of varying age, as before. Superior endplate compression deformity at T9 and T12 is stable.  No acute findings in the abdomen or pelvis.  Right Chest tube  placed with recovery of bloody pleural fluid.  Labs notable for Hgb 4.8.  Patient was transferred to [facility] for acute management  July 02 2022:  Admitted to [facility]  Placed in ICU.  Chest tube removed as bloody output declined.   Transfused with 3 units PRBCs CT Chest without contrast showed  Right-sided chest tube in place with tip terminating in the right upper lobe with surrounding pulmonary hemorrhage. Similar to prior, the side-port terminates outside of the thoracic cavity. This tube is not well positioned to drain the remaining right-sided pleural fluid.  Moderate sized right hydropneumothorax.    July 08 2022:  WBC 2.6 Hgb 8.2 PLT 69; 39 seg 51 lymph 7 mono 2 eos 1 baso.  Cr 2.37   July 11 2022:  Post hospital follow up.  Having considerable back pain.  Also has pain in  right chest which was site of chest tube.  Fatigued.  Not sleeping well due to pain.  Using narcotic analgesics sparingly because she is concerned about developing addiction.  Has oxycodone,  gabapentin, Trazadone for pain.  Asked her to provide a tally of how many oxycodone tablets she has.  She requested considering addition of a muscle relaxer.   Reviewed results of bone marrow, SPEP with IEP, free light chain and FISH results with patient and her family   WBC 2.7 hemoglobin 7.6 platelet count 93; 32 segs 53 lymphs 6 monos 2 eos 1 basophil 6 immature  Chemistry is notable for creatinine 2.89 albumin 1.6 total protein 10.7.  SPEP  with IEP demonstrated an M spike of 6.8 g/dL of paraprotein.  Serum free kappa 4673 lambda 2.8 with a kappa lambda 1453 IgG 188 IgA 4897 IgM 15  July 13 2022:  Cycle 1 Day 8 (delayed)  July 18 2022:  Scheduled follow up for management of multiple myeloma.  Reviewed results of labs with patient, her sister and mother.  She continues to have fatigue and rib pain.  Taking oxycodone sparingly.  Discussed the use of low-dose long-acting MS Contin.  Since last visit I have also contacted  Dr. Enzo Montgomery at [facility] to discuss autotransplant.  We will send records regarding patient.  Patient also having scotomas and blurry vision.  I contacted Dr. Dwana Melena from [facility] to arrange for a follow up.  There is concern that her visual disturbances are related to hyperviscosity from myeloma.  Needing help with disability paperwork.  Requesting home PT.    WBC 1.9 hemoglobin 7.3 platelet count 71 and RBC 1.6  July 19, 2022 received 1 unit of packed RBCs  July 20 2022:  Cycle 1 Day 1 Bortezomib/ Lenolidomide/Dex/ Dara July 21, 2022: WBC 1.7 hemoglobin 6.4 platelet count 62; 66 segs 20 lymphs 4 monos 2 eos  July 24, 2022:  Scheduled follow-up for management of multiple myeloma Had episodes of confusion since last visit Exacerbated by taking 1/2 tablets of MS Contin 15 mg for pain (Hold) Has also taken gabapentin.  (Hold) Have held xyzal since friday Not taking clolopin, trazodone, zyrtec, scopolamine patch  Here with husband and mother Husband does most of the talking  Discussed medication induced delerium To receive 2 units of packed red blood cells today  Cycle 1 Day 4 Bortezomib/ Lenolidomide/Dex/ Dara  WBC 1.7 hemoglobin 10.3 platelet count 55; 53 segs 37 lymphs 4 monos 3 eos 1 basophil 2 immature granulocytes CMP notable for creatinine 3.03 calcium 7.8 albumin 1.6 total protein 11.4 calculated GFR 17   July 26 2022:  Has been weaned off MS Contin, gabapenin, Xyzal etc since last visit.  Mentation clearer.  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  Not requiring narcotic analgesics.  Has diarrhea due to d/c of morphine and continued use of cathartics.  Will hold cathartics.   Appetite remains diminished.  Has dysgeusia but no nausea/ emesis.   Receiving IVF today.    Cycle 1 Day 8 Bortezomib/ Lenolidomide/Dex/ Dara  WBC 2.2 hemoglobin 9.4 platelet count 35 ANC 1.52 Chemistries notable for creatinine 3.1 alk phos 128  albumin 2.9  July 31 2022:  Cycle 1 Day 11 Bortezomib/ Lenolidomide/Dex/ Dara  August 01 2022:  Scheduled follow up for management of multiple myeloma.  Anorectic due to dysgeusia (things taste sweet) and nauseated without emesis.  Antiemetics not effective against nausea.  Not sleeping well at night.  Rib pain improving but head bump stable.   Using narcotic analgesics sparingly.  Has lost 2 lbs since last visit.  ECOG 2.  To meet with a counselor regarding disability paperwork.  Interested in obtaining a wig prior to bone marrow transplant  August 03, 2022: Cycle 1 Day 14 Bortezomib/ Lenolidomide/Dex/ Dara  August 10 2022:  Cycle 2 Day 1 Bortezomib/ Lenolidomide/Dex/ Dara  August 14 2022:  BMT consult [place]  Review of Systems  Constitutional:  Positive for fatigue. Negative for chills and fever.  HENT:   Negative for hearing loss, lump/mass, mouth sores, nosebleeds, sore throat, tinnitus, trouble swallowing and voice change.   Eyes:  Positive for  eye problems. Negative for icterus.       Vision changes:  None  Respiratory:  Negative for chest tightness, cough, hemoptysis, shortness of breath and wheezing.        PND:  none Orthopnea:  none   Cardiovascular:  Negative for chest pain, leg swelling and palpitations.       PND:  none Orthopnea:  none  Gastrointestinal:  Positive for constipation. Negative for abdominal distention, abdominal pain, blood in stool, diarrhea, nausea and vomiting.       Constipation improved with treatment of hypecalcemia  Endocrine: Negative for hot flashes.       Cold intolerance:  none Heat intolerance:  none  Genitourinary:  Negative for bladder incontinence, difficulty urinating, dysuria, frequency, hematuria and nocturia.   Musculoskeletal:  Positive for arthralgias and myalgias. Negative for back pain, gait problem, neck pain and neck stiffness.       Arthralgias in shoulders, ribs, back which makes sleeping difficult.  Uses Oxycodone  infrequently since it does not provide much relief  Skin:  Negative for itching, rash and wound.  Neurological:  Positive for extremity weakness. Negative for dizziness, gait problem, headaches, light-headedness, numbness, seizures and speech difficulty.  Hematological:  Negative for adenopathy. Does not bruise/bleed easily.  Psychiatric/Behavioral:  Positive for sleep disturbance. Negative for confusion, decreased concentration and suicidal ideas. The patient is not nervous/anxious.        Concentration improving    MEDICAL HISTORY: Past Medical History:  Diagnosis Date   ADHD (attention deficit hyperactivity disorder)    Anxiety    Depression    GERD (gastroesophageal reflux disease)    Hypothyroidism    Multiple allergies    Osteoarthritis     SURGICAL HISTORY: Past Surgical History:  Procedure Laterality Date   ADENOIDECTOMY     BONE MARROW BIOPSY     CHEST TUBE INSERTION     Taken out after 3 days   KNEE SURGERY Bilateral    PARTIAL HYSTERECTOMY     SINOSCOPY     TONSILLECTOMY     TUBAL LIGATION     WRIST SURGERY Bilateral     SOCIAL HISTORY: Social History   Socioeconomic History   Marital status: Married    Spouse name: Carloyn Manner   Number of children: 2   Years of education: 14   Highest education level: Associate degree: academic program  Occupational History   Not on file  Tobacco Use   Smoking status: Never   Smokeless tobacco: Never  Vaping Use   Vaping Use: Never used  Substance and Sexual Activity   Alcohol use: Yes    Comment: rarely   Drug use: Never   Sexual activity: Not Currently    Partners: Male    Comment: Married but no interest  Other Topics Concern   Not on file  Social History Narrative   Not on file   Social Determinants of Health   Financial Resource Strain: Not on file  Food Insecurity: Not on file  Transportation Needs: Not on file  Physical Activity: Not on file  Stress: Not on file  Social Connections: Not on file   Intimate Partner Violence: Not on file    FAMILY HISTORY Family History  Problem Relation Age of Onset   Hypertension Mother    Diabetes Mother    Glaucoma Mother    Gout Mother    Congestive Heart Failure Mother    Allergic rhinitis Father    Thyroid disease Father    Allergic  rhinitis Brother    Gout Brother     ALLERGIES:  is allergic to doxycycline and tape.  MEDICATIONS:  Current Outpatient Medications  Medication Sig Dispense Refill   acyclovir (ZOVIRAX) 400 MG tablet Take 1 tablet (400 mg total) by mouth 2 (two) times daily. 60 tablet 3   amoxicillin (AMOXIL) 500 MG tablet TAKE 4 TABLETS PRIOR TO DENTAL PROCEDURE AS DIRECTED. (Patient not taking: Reported on 07/26/2022)     ASPIRIN 81 PO Take 81 mg by mouth daily.     azelastine (ASTELIN) 0.1 % nasal spray Place 2 sprays into both nostrils 2 (two) times daily as needed for rhinitis. Use in each nostril as directed 30 mL 3   dexamethasone (DECADRON) 4 MG tablet Take 4 mg by mouth daily.     dexamethasone (DECADRON) 4 MG tablet Take 8 mg po daily for 2 days after Cytoxan chemotherapy. 30 tablet 3   DEXILANT 60 MG capsule Take 1 capsule by mouth daily. (Patient not taking: Reported on 07/26/2022)  0   fluticasone (FLONASE) 50 MCG/ACT nasal spray Place 1 spray into the nose daily.     lenalidomide (REVLIMID) 10 MG capsule Take 1 capsule (10 mg total) by mouth daily. Take for 14 days, then hold for 7 days. Repeat every 21 days. 14 capsule 0   levothyroxine (SYNTHROID) 88 MCG tablet Take 88 mcg by mouth daily.  3   montelukast (SINGULAIR) 10 MG tablet Take 1 tablet by mouth daily.     ondansetron (ZOFRAN-ODT) 4 MG disintegrating tablet Take 4 mg by mouth every 8 (eight) hours as needed.     oxyCODONE (OXY IR/ROXICODONE) 5 MG immediate release tablet Take by mouth. Take 1 tablet (5 mg total) by mouth every 4 (four) hours as needed for Moderate pain (4-6) or Severe pain (7-10).     Wheat Dextrin (BENEFIBER PO) Take by mouth.      Current Facility-Administered Medications  Medication Dose Route Frequency Provider Last Rate Last Admin   acetaminophen (TYLENOL) tablet 650 mg  650 mg Oral Once Rosanne Sack A, PA-C       Facility-Administered Medications Ordered in Other Visits  Medication Dose Route Frequency Provider Last Rate Last Admin   heparin lock flush 100 unit/mL  500 Units Intracatheter Once PRN , Gregary Signs, MD       sodium chloride flush (NS) 0.9 % injection 10 mL  10 mL Intracatheter PRN , Gregary Signs, MD        PHYSICAL EXAMINATION:  ECOG PERFORMANCE STATUS: 2 - Symptomatic, <50% confined to bed   There were no vitals filed for this visit.    There were no vitals filed for this visit.     Physical Exam Vitals and nursing note reviewed.  Constitutional:      General: She is not in acute distress.    Appearance: Normal appearance. She is obese. She is not toxic-appearing or diaphoretic.     Comments: Here with brother.  Tired but fairly comfortable appearing.    HENT:     Head: Normocephalic and atraumatic.     Right Ear: External ear normal.     Left Ear: External ear normal.     Nose: Nose normal. No congestion or rhinorrhea.     Mouth/Throat:     Mouth: Mucous membranes are moist.  Eyes:     General: No scleral icterus.    Extraocular Movements: Extraocular movements intact.     Conjunctiva/sclera: Conjunctivae normal.     Pupils: Pupils  are equal, round, and reactive to light.  Cardiovascular:     Rate and Rhythm: Normal rate and regular rhythm.     Heart sounds: Normal heart sounds. No murmur heard.    No friction rub. No gallop.  Pulmonary:     Effort: Pulmonary effort is normal. No respiratory distress.     Breath sounds: Normal breath sounds. No stridor. No wheezing or rales.  Chest:     Chest wall: No tenderness.  Abdominal:     General: Bowel sounds are normal. There is no distension.     Palpations: Abdomen is soft.     Tenderness: There is no abdominal  tenderness. There is no guarding or rebound.  Musculoskeletal:        General: No swelling, tenderness or deformity.     Cervical back: Normal range of motion and neck supple. No rigidity or tenderness.     Right lower leg: No edema.     Left lower leg: No edema.  Lymphadenopathy:     Head:     Right side of head: No submental, submandibular, tonsillar, preauricular, posterior auricular or occipital adenopathy.     Left side of head: No submental, submandibular, tonsillar, preauricular, posterior auricular or occipital adenopathy.     Cervical: No cervical adenopathy.     Right cervical: No superficial, deep or posterior cervical adenopathy.    Left cervical: No superficial, deep or posterior cervical adenopathy.     Upper Body:     Right upper body: No supraclavicular, axillary, pectoral or epitrochlear adenopathy.     Left upper body: No supraclavicular, axillary, pectoral or epitrochlear adenopathy.  Skin:    General: Skin is warm.     Coloration: Skin is not jaundiced.     Findings: No bruising, erythema or rash.  Neurological:     General: No focal deficit present.     Mental Status: She is alert and oriented to person, place, and time. Mental status is at baseline.     Cranial Nerves: No cranial nerve deficit.     Sensory: No sensory deficit.  Psychiatric:        Mood and Affect: Mood normal.        Behavior: Behavior normal.        Thought Content: Thought content normal.        Judgment: Judgment normal.      LABORATORY DATA: I have personally reviewed the data as listed:  Office Visit on 07/26/2022  Component Date Value Ref Range Status   Hemoglobin 07/26/2022 9.4 (A)  12.0 - 16.0 Final   HCT 07/26/2022 27 (A)  36 - 46 Final   Neutrophils Absolute 07/26/2022 1.52   Final   Platelets 07/26/2022 35 (A)  150 - 400 K/uL Final   WBC 07/26/2022 2.2   Final   RBC 07/26/2022 3.17 (A)  3.87 - 5.11 Final   Glucose 07/26/2022 98   Final   BUN 07/26/2022 27 (A)  4 - 21  Final   CO2 07/26/2022 16  13 - 22 Final   Creatinine 07/26/2022 3.1 (A)  0.5 - 1.1 Final   Potassium 07/26/2022 3.5  3.5 - 5.1 mEq/L Final   Sodium 07/26/2022 134 (A)  137 - 147 Final   Chloride 07/26/2022 100  99 - 108 Final   Calcium 07/26/2022 8.3 (A)  8.7 - 10.7 Final   Albumin 07/26/2022 2.9 (A)  3.5 - 5.0 Final   Alkaline Phosphatase 07/26/2022 128 (A)  25 - 125 Final  ALT 07/26/2022 28  7 - 35 U/L Final   AST 07/26/2022 34  13 - 35 Final   Bilirubin, Total 07/26/2022 0.9   Final  Appointment on 07/24/2022  Component Date Value Ref Range Status   WBC Count 07/24/2022 1.7 (L)  4.0 - 10.5 K/uL Final   RBC 07/24/2022 3.62 (L)  3.87 - 5.11 MIL/uL Final   Hemoglobin 07/24/2022 10.3 (L)  12.0 - 15.0 g/dL Final   Comment: REPEATED TO VERIFY POST TRANSFUSION SPECIMEN    HCT 07/24/2022 32.1 (L)  36.0 - 46.0 % Final   MCV 07/24/2022 88.7  80.0 - 100.0 fL Final   MCH 07/24/2022 28.5  26.0 - 34.0 pg Final   MCHC 07/24/2022 32.1  30.0 - 36.0 g/dL Final   RDW 07/24/2022 17.5 (H)  11.5 - 15.5 % Final   Platelet Count 07/24/2022 55 (L)  150 - 400 K/uL Final   Comment: Immature Platelet Fraction may be clinically indicated, consider ordering this additional test [id number] CONSISTENT WITH PREVIOUS RESULT REPEATED TO VERIFY    nRBC 07/24/2022 14.8 (H)  0.0 - 0.2 % Final   Neutrophils Relative % 07/24/2022 53  % Final   Neutro Abs 07/24/2022 0.9 (L)  1.7 - 7.7 K/uL Final   Lymphocytes Relative 07/24/2022 37  % Final   Lymphs Abs 07/24/2022 0.6 (L)  0.7 - 4.0 K/uL Final   Monocytes Relative 07/24/2022 4  % Final   Monocytes Absolute 07/24/2022 0.1  0.1 - 1.0 K/uL Final   Eosinophils Relative 07/24/2022 3  % Final   Eosinophils Absolute 07/24/2022 0.1  0.0 - 0.5 K/uL Final   Basophils Relative 07/24/2022 1  % Final   Basophils Absolute 07/24/2022 0.0  0.0 - 0.1 K/uL Final   WBC Morphology 07/24/2022 VACUOLATED NEUTROPHILS   Final   RBC Morphology 07/24/2022 MORPHOLOGY UNREMARKABLE    Final   Smear Review 07/24/2022 MORPHOLOGY UNREMARKABLE   Final   Immature Granulocytes 07/24/2022 2  % Final   Abs Immature Granulocytes 07/24/2022 0.03  0.00 - 0.07 K/uL Final   [place] 07/24/2022 PRESENT   Final   Performed at [facility], [place] [address], [place], [place] [address]   Sodium 07/24/2022 135  135 - 145 mmol/L Final   Potassium 07/24/2022 3.7  3.5 - 5.1 mmol/L Final   Chloride 07/24/2022 102  98 - 111 mmol/L Final   CO2 07/24/2022 20 (L)  22 - 32 mmol/L Final   Glucose, Bld 07/24/2022 82  70 - 99 mg/dL Final   Glucose reference range applies only to samples taken after fasting for at least 8 hours.   BUN 07/24/2022 31 (H)  6 - 20 mg/dL Final   Creatinine 07/24/2022 3.03 (HH)  0.44 - 1.00 mg/dL Final   CRITICAL RESULT CALLED TO, READ BACK BY AND VERIFIED WITH LEWIS, MD 07/24/22 1837 J. COLE   Calcium 07/24/2022 7.8 (L)  8.9 - 10.3 mg/dL Final   Total Protein 07/24/2022 11.4 (H)  6.5 - 8.1 g/dL Final   Albumin 07/24/2022 1.6 (L)  3.5 - 5.0 g/dL Final   AST 07/24/2022 38  15 - 41 U/L Final   ALT 07/24/2022 31  0 - 44 U/L Final   Alkaline Phosphatase 07/24/2022 51  38 - 126 U/L Final   Total Bilirubin 07/24/2022 0.9  0.3 - 1.2 mg/dL Final   GFR, Estimated 07/24/2022 17 (L)  >60 mL/min Final   Comment: (NOTE) Calculated using the CKD-EPI Creatinine Equation (2021)  Anion gap 07/24/2022 13  5 - 15 Final   Performed at [facility], [place] [address], [place], [place] [address]   Blood Bank Specimen 07/24/2022 SAMPLE AVAILABLE FOR TESTING   Final   Sample Expiration 07/24/2022    Final                   Value:07/27/2022,2359 Performed at [facility], [place] [address] [place], [place] [address]   Clinical Support on 07/21/2022  Component Date Value Ref Range Status   WBC Count 07/21/2022 1.7 (L)  4.0 - 10.5 K/uL Final   RBC 07/21/2022 2.29 (L)  3.87 - 5.11 MIL/uL Final   Hemoglobin 07/21/2022 6.4 (LL)  12.0 - 15.0  g/dL Final   Comment: REPEATED TO VERIFY THIS CRITICAL RESULT HAS VERIFIED AND BEEN CALLED TO DUNLAP,K. RN BY NICOLE MCCOY ON 11 17 2023 AT 63, AND HAS BEEN READ BACK. CRITICA L RESULT VERIFIED    HCT 07/21/2022 20.4 (L)  36.0 - 46.0 % Final   MCV 07/21/2022 89.1  80.0 - 100.0 fL Final   MCH 07/21/2022 27.9  26.0 - 34.0 pg Final   MCHC 07/21/2022 31.4  30.0 - 36.0 g/dL Final   RDW 07/21/2022 18.1 (H)  11.5 - 15.5 % Final   Platelet Count 07/21/2022 62 (L)  150 - 400 K/uL Final   Comment: Immature Platelet Fraction may be clinically indicated, consider ordering this additional test [id number] CONSISTENT WITH PREVIOUS RESULT    nRBC 07/21/2022 1.8 (H)  0.0 - 0.2 % Final   Neutrophils Relative % 07/21/2022 66  % Final   Neutro Abs 07/21/2022 1.1 (L)  1.7 - 7.7 K/uL Final   Lymphocytes Relative 07/21/2022 28  % Final   Lymphs Abs 07/21/2022 0.5 (L)  0.7 - 4.0 K/uL Final   Monocytes Relative 07/21/2022 4  % Final   Monocytes Absolute 07/21/2022 0.1  0.1 - 1.0 K/uL Final   Eosinophils Relative 07/21/2022 0  % Final   Eosinophils Absolute 07/21/2022 0.0  0.0 - 0.5 K/uL Final   Basophils Relative 07/21/2022 0  % Final   Basophils Absolute 07/21/2022 0.0  0.0 - 0.1 K/uL Final   Immature Granulocytes 07/21/2022 2  % Final   Abs Immature Granulocytes 07/21/2022 0.04  0.00 - 0.07 K/uL Final   Rouleaux 07/21/2022 PRESENT   Final   Performed at [facility], [place] [address] [place], [place] [address]   Blood Bank Specimen 07/21/2022 SAMPLE AVAILABLE FOR TESTING   Final   Sample Expiration 07/21/2022    Final                   Value:07/24/2022,2359 Performed at [facility], [place] [address] [place], [place] [address]    ABO/RH(D) 07/21/2022 O POS   Final   Antibody Screen 07/21/2022 POS   Final   Sample Expiration 07/21/2022 07/24/2022,2359   Final   Antibody Identification 24/40/1027 NON SPECIFIC ANTIBODY REACTIVITY   Final   Unit Number 07/21/2022  [id number]   Final   Blood Component Type 07/21/2022 RED CELLS,LR   Final   Unit division 07/21/2022 00   Final   Status of Unit 07/21/2022 ISSUED,FINAL   Final   Transfusion Status 07/21/2022 OK TO TRANSFUSE   Final   Crossmatch Result 07/21/2022 COMPATIBLE   Final   Donor AG Type 07/21/2022 NEGATIVE FOR E ANTIGEN NEGATIVE FOR KELL ANTIGEN NEGATIVE FOR DUFFY A ANTIGEN NEGATIVE FOR S ANTIGEN  Final   Unit Number 07/21/2022 [id number]   Final   Blood Component Type 07/21/2022 RED CELLS,LR   Final   Unit division 07/21/2022 00   Final   Status of Unit 07/21/2022 ISSUED,FINAL   Final   Donor AG Type 07/21/2022 NEGATIVE FOR E ANTIGEN NEGATIVE FOR KELL ANTIGEN NEGATIVE FOR S ANTIGEN NEGATIVE FOR DUFFY A ANTIGEN   Final   Transfusion Status 07/21/2022 OK TO TRANSFUSE   Final   Crossmatch Result 07/21/2022 COMPATIBLE   Final   ISSUE DATE / TIME 07/21/2022 [id number]   Final   Blood Product Unit Number 07/21/2022 [id number]   Final   PRODUCT CODE 07/21/2022 [id number]   Final   Unit Type and Rh 07/21/2022 5100   Final   Blood Product Expiration Date 07/21/2022 811031594585   Final   ISSUE DATE / TIME 07/21/2022 [id number]   Final   Blood Product Unit Number 07/21/2022 [id number]   Final   PRODUCT CODE 07/21/2022 [id number]   Final   Unit Type and Rh 07/21/2022 5100   Final   Blood Product Expiration Date 07/21/2022 [id number]   Final   Order Confirmation 07/23/2022    Final                   Value:ORDER PROCESSED BY BLOOD BANK Performed at [facility], [place] [address] [place], [place] [address]   Office Visit on 07/18/2022  Component Date Value Ref Range Status   PT AG Type 07/12/2022    Final                   Value:POSITIVE FOR c ANTIGEN POSITIVE FOR C ANTIGEN NEGATIVE FOR E ANTIGEN POSITIVE FOR e ANTIGEN NEGATIVE FOR KELL ANTIGEN NEGATIVE FOR DUFFY A ANTIGEN POSITIVE FOR DUFFY B ANTIGEN POSITIVE FOR KIDD A ANTIGEN POSITIVE FOR KIDD B ANTIGEN NEGATIVE FOR S  ANTIGEN  POSITIVE FOR s ANTIGEN POSITIVE FOR M ANTIGEN Performed at [facility], [place] [address], [place], [place] [address]   Clinical Support on 07/18/2022  Component Date Value Ref Range Status   ABO/RH(D) 07/18/2022 O POS   Final   Antibody Screen 07/18/2022 POS   Final   Sample Expiration 07/18/2022 07/21/2022,2359   Final   Antibody Identification 02/33/4356 ANTI E   Final   Unit Number 07/18/2022 [id number]   Final   Blood Component Type 07/18/2022 RED CELLS,LR   Final   Unit division 07/18/2022 00   Final   Status of Unit 07/18/2022 ISSUED,FINAL   Final   Donor AG Type 07/18/2022 NEGATIVE FOR E ANTIGEN   Final   Transfusion Status 07/18/2022 OK TO TRANSFUSE   Final   Crossmatch Result 07/18/2022 COMPATIBLE   Final   WBC Count 07/18/2022 1.9 (L)  4.0 - 10.5 K/uL Final   RBC 07/18/2022 2.54 (L)  3.87 - 5.11 MIL/uL Final   Hemoglobin 07/18/2022 7.3 (L)  12.0 - 15.0 g/dL Final   HCT 07/18/2022 23.5 (L)  36.0 - 46.0 % Final   MCV 07/18/2022 92.5  80.0 - 100.0 fL Final   MCH 07/18/2022 28.7  26.0 - 34.0 pg Final   MCHC 07/18/2022 31.1  30.0 - 36.0 g/dL Final   RDW 07/18/2022 18.3 (H)  11.5 - 15.5 % Final   Platelet Count 07/18/2022 71 (L)  150 - 400 K/uL Final   Comment: SPECIMEN CHECKED FOR CLOTS Immature Platelet Fraction may be clinically indicated, consider ordering this additional test [id number] CONSISTENT WITH PREVIOUS RESULT REPEATED TO  VERIFY    nRBC 07/18/2022 1.6 (H)  0.0 - 0.2 % Final   Performed at [place] [address], [place], [place] [address]   WBC Count 07/19/2022 2.0 (L)  4.0 - 10.5 K/uL Final   RBC 07/19/2022 2.41 (L)  3.87 - 5.11 MIL/uL Final   Hemoglobin 07/19/2022 6.7 (LL)  12.0 - 15.0 g/dL Final   Comment: REPEATED TO VERIFY THIS CRITICAL RESULT HAS VERIFIED AND BEEN CALLED TO RN KATRINA D BY ALEXIS CRUICKSHANK ON 11 16 2023 AT 1413, AND HAS BEEN READ BACK.     HCT 07/19/2022 22.2 (L)  36.0 - 46.0 %  Final   MCV 07/19/2022 92.1  80.0 - 100.0 fL Final   MCH 07/19/2022 27.8  26.0 - 34.0 pg Final   MCHC 07/19/2022 30.2  30.0 - 36.0 g/dL Final   RDW 07/19/2022 18.1 (H)  11.5 - 15.5 % Final   Platelet Count 07/19/2022 66 (L)  150 - 400 K/uL Final   Comment: SPECIMEN CHECKED FOR CLOTS Immature Platelet Fraction may be clinically indicated, consider ordering this additional test [id number] CONSISTENT WITH PREVIOUS RESULT REPEATED TO VERIFY    nRBC 07/19/2022 1.0 (H)  0.0 - 0.2 % Final   Neutrophils Relative % 07/19/2022 32  % Final   Neutro Abs 07/19/2022 0.6 (L)  1.7 - 7.7 K/uL Final   Lymphocytes Relative 07/19/2022 49  % Final   Lymphs Abs 07/19/2022 1.0  0.7 - 4.0 K/uL Final   Monocytes Relative 07/19/2022 8  % Final   Monocytes Absolute 07/19/2022 0.2  0.1 - 1.0 K/uL Final   Eosinophils Relative 07/19/2022 3  % Final   Eosinophils Absolute 07/19/2022 0.1  0.0 - 0.5 K/uL Final   Basophils Relative 07/19/2022 1  % Final   Basophils Absolute 07/19/2022 0.0  0.0 - 0.1 K/uL Final   Immature Granulocytes 07/19/2022 7  % Final   Abs Immature Granulocytes 07/19/2022 0.13 (H)  0.00 - 0.07 K/uL Final   Rouleaux 07/19/2022 PRESENT   Final   Performed at [facility], [place] [address] [place], [place] [address]   Order Confirmation 07/19/2022    Final                   Value:ORDER PROCESSED BY BLOOD BANK Performed at [facility], [place] [address] [place], [place] [address]    ISSUE DATE / TIME 07/18/2022 [id number]   Final   Blood Product Unit Number 07/18/2022 [id number]   Final   PRODUCT CODE 07/18/2022 [id number]   Final   Unit Type and Rh 07/18/2022 5100   Final   Blood Product Expiration Date 07/18/2022 [id number]   Final  Lab on 07/12/2022  Component Date Value Ref Range Status   ABO/RH(D) 07/12/2022 O POS   Final   Antibody Screen 07/12/2022 POS   Final   Sample Expiration 07/12/2022 07/15/2022,2359   Final   Antibody Identification  41/74/0814 ANTI E   Final   PT AG Type 07/12/2022 NEGATIVE FOR E ANTIGEN   Final   Unit Number 07/12/2022 [id number]   Final   Blood Component Type 07/12/2022 RED CELLS,LR   Final   Unit division 07/12/2022 00   Final   Status of Unit 07/12/2022 ISSUED,FINAL   Final   Donor AG Type 07/12/2022 NEGATIVE FOR E ANTIGEN   Final   Transfusion Status 07/12/2022 OK TO TRANSFUSE   Final   Crossmatch Result 07/12/2022 COMPATIBLE   Final   Order  Confirmation 07/12/2022    Final                   Value:ORDER PROCESSED BY BLOOD BANK Performed at [facility], [place] [address] [place], [place] [address]    ISSUE DATE / TIME 07/12/2022 [id number]   Final   Blood Product Unit Number 07/12/2022 [id number]   Final   PRODUCT CODE 07/12/2022 [id number]   Final   Unit Type and Rh 07/12/2022 9500   Final   Blood Product Expiration Date 07/12/2022 [id number]   Final   ABO/RH(D) 07/12/2022    Final                   Value:O POS Performed at [facility], [place] [address] [place], [place] [address]   Clinical Support on 07/11/2022  Component Date Value Ref Range Status   Sodium 07/11/2022 138  135 - 145 mmol/L Final   Potassium 07/11/2022 4.4  3.5 - 5.1 mmol/L Final   Chloride 07/11/2022 100  98 - 111 mmol/L Final   CO2 07/11/2022 19 (L)  22 - 32 mmol/L Final   Glucose, Bld 07/11/2022 91  70 - 99 mg/dL Final   Glucose reference range applies only to samples taken after fasting for at least 8 hours.   BUN 07/11/2022 31 (H)  6 - 20 mg/dL Final   Creatinine 07/11/2022 2.89 (H)  0.44 - 1.00 mg/dL Final   Calcium 07/11/2022 9.6  8.9 - 10.3 mg/dL Final   Total Protein 07/11/2022 10.7 (H)  6.5 - 8.1 g/dL Final   Albumin 07/11/2022 1.6 (L)  3.5 - 5.0 g/dL Final   AST 07/11/2022 24  15 - 41 U/L Final   ALT 07/11/2022 16  0 - 44 U/L Final   Alkaline Phosphatase 07/11/2022 69  38 - 126 U/L Final   Total Bilirubin 07/11/2022 0.6  0.3 - 1.2 mg/dL Final   GFR, Estimated  07/11/2022 19 (L)  >60 mL/min Final   Comment: (NOTE) Calculated using the CKD-EPI Creatinine Equation (2021)    Anion gap 07/11/2022 19 (H)  5 - 15 Final   Performed at [facility], [place] [address], [place], [place] [address]   WBC Count 07/11/2022 2.7 (L)  4.0 - 10.5 K/uL Final   RBC 07/11/2022 2.72 (L)  3.87 - 5.11 MIL/uL Final   Hemoglobin 07/11/2022 7.6 (L)  12.0 - 15.0 g/dL Final   HCT 07/11/2022 24.7 (L)  36.0 - 46.0 % Final   MCV 07/11/2022 90.8  80.0 - 100.0 fL Final   MCH 07/11/2022 27.9  26.0 - 34.0 pg Final   MCHC 07/11/2022 30.8  30.0 - 36.0 g/dL Final   RDW 07/11/2022 18.6 (H)  11.5 - 15.5 % Final   Platelet Count 07/11/2022 93 (L)  150 - 400 K/uL Final   Comment: SPECIMEN CHECKED FOR CLOTS Immature Platelet Fraction may be clinically indicated, consider ordering this additional test [id number] CONSISTENT WITH PREVIOUS RESULT REPEATED TO VERIFY    nRBC 07/11/2022 0.0  0.0 - 0.2 % Final   Neutrophils Relative % 07/11/2022 32  % Final   Neutro Abs 07/11/2022 0.9 (L)  1.7 - 7.7 K/uL Final   Lymphocytes Relative 07/11/2022 53  % Final   Lymphs Abs 07/11/2022 1.5  0.7 - 4.0 K/uL Final   Monocytes Relative 07/11/2022 6  % Final   Monocytes Absolute 07/11/2022 0.2  0.1 - 1.0 K/uL Final   Eosinophils Relative 07/11/2022 2  %  Final   Eosinophils Absolute 07/11/2022 0.0  0.0 - 0.5 K/uL Final   Basophils Relative 07/11/2022 1  % Final   Basophils Absolute 07/11/2022 0.0  0.0 - 0.1 K/uL Final   Immature Granulocytes 07/11/2022 6  % Final   Abs Immature Granulocytes 07/11/2022 0.17 (H)  0.00 - 0.07 K/uL Final   Rouleaux 07/11/2022 PRESENT   Final   Performed at [facility], [place] [address] [place], [place] [address]   IgG (Immunoglobin G), Serum 07/11/2022 188 (L)  586 - 1,602 mg/dL Final   Result confirmed on concentration.   IgA 07/11/2022 4,897 (H)  87 - 352 mg/dL Final   Comment: (NOTE) Results confirmed on dilution.    IgM  (Immunoglobulin M), Srm 07/11/2022 15 (L)  26 - 217 mg/dL Final   Result confirmed on concentration.   Total Protein ELP 07/11/2022 11.0 (H)  6.0 - 8.5 g/dL Corrected   **Verified by repeat analysis**   Albumin SerPl Elph-Mcnc 07/11/2022 2.5 (L)  2.9 - 4.4 g/dL Corrected   Alpha 1 07/11/2022 0.3  0.0 - 0.4 g/dL Corrected   Alpha2 Glob SerPl Elph-Mcnc 07/11/2022 0.5  0.4 - 1.0 g/dL Corrected   B-Globulin SerPl Elph-Mcnc 07/11/2022 7.4 (H)  0.7 - 1.3 g/dL Corrected   Gamma Glob SerPl Elph-Mcnc 07/11/2022 0.3 (L)  0.4 - 1.8 g/dL Corrected   M Protein SerPl Elph-Mcnc 07/11/2022 6.8 (H)  Not Observed g/dL Corrected   Globulin, Total 07/11/2022 8.5 (H)  2.2 - 3.9 g/dL Corrected   Albumin/Glob SerPl 07/11/2022 0.3 (L)  0.7 - 1.7 Corrected   IFE 1 07/11/2022 Comment (A)   Corrected   Comment: (NOTE) Immunofixation shows IgA monoclonal protein with kappa light chain specificity.    Please Note 07/11/2022 Comment   Corrected   Comment: (NOTE) Protein electrophoresis scan will follow via computer, mail, or courier delivery. Performed At: [facility] [place], [place] [id number] Rush Farmer MD OV:[id number]    Kappa free light chain 07/11/2022 4,067.3 (H)  3.3 - 19.4 mg/L Final   Lambda free light chains 07/11/2022 2.8 (L)  5.7 - 26.3 mg/L Final   Kappa, lambda light chain ratio 07/11/2022 1,452.61 (H)  0.26 - 1.65 Final   Comment: (NOTE) Performed At: [facility] [place], [place] [id number] Rush Farmer MD HM:[id number]     RADIOGRAPHIC STUDIES: I have personally reviewed the radiological images as listed and agree with the findings in the report  No results found.  ASSESSMENT/PLAN 56 year old female who on June 14 2022 presented with acute renal failure, hypercalcemia, elevated total protein in setting of hypoalbuminemia, cytopenias (anemia, thrombocytopenia), lytic bone lesions which has lead to the diagnosis of multiple myeloma.     Multiple myeloma ISS-Stage III, Biclonal IgA kappa + kappa light chain escape/ FISH with TP 53 deletion and Cyclin D1/IgH fusion:  Diagnosed in October 2023 after patient presented with hypercalcemia, AKI, wide spread lytic lesions in skeleton, cytopenias.  At presentation serum free kappa 3067 lambda 2.3 with a kappa lambda 1333. IgA >6400 Blood viscosity 5.0  24 hr urine showed loss of 1582 mg protein.  UPEP  showed loss of 1023 mg of paraprotein.   BM Bx showed extensive involvement by plasma cells with kappa restriction.    Therapeutics:  Patient has high risk disease and is a stem cell transplant candidate  June 29, 2022: Cycle 1 Day 1 CyBorD.  Initiation of chemotherapy delayed due to insurance reasons.  Cycle 1  Day 8 interrupted because of hemothorax July 13 2022: Cycle 1 Day 8 CyBorD.  Labs notable for improvement in IgA M spike but increased kappa light chains.   July 20 2022:  Cycle 1 Vrd + Dara. Revlimid dose 10 mg daily Days 1 to 14 due to decreased renal function.  Will observe closely for side effects from Revlimid due to decreased renal fxn  August 01 2022:  Instructed patient to hold Revlimid for several days as it may be contributing to Nausea   Hypercalcemia:  Secondary to myeloma.  Was treated with IVF, calcitonin, Dexamethasone, Zometa. Improved with therapy of myeloma.  On Xgeva instead of Zometa due to CKD.        Pulmonary HTN:  Has been reported in Multiple Myeloma.  Multifactorial can be related to caused by pulmonary vasculopathy, endothelial dysfunction, pulmonary vascular amyloidosis, hyperviscosity.  Noted to have hyperviscosity on labs drawn during the hospitalization.  Not hypoxemic so doubt PE  Cancer related pain:  Due to bone lesions from myeloma.  Will improve with treatment.   July 18 2022:  Adding MS Contin 15 mg po bid.      July 26 2022:  Stopped MS Contin due to delirium August 01 2022:  Using Oxycodone sparingly because bone pain  improved  Poor venous access:  Port-a-cath placed by Radiology during the hospitalization in October 2023  Right Hemothorax:  Secondary to pathologic rib fractures from myeloma.  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  Cytology from pleural fluid has been negative for plasma cellsUnderwent chest tube placement  Follow up CXR improved.  Hgb more stable and chest pain improved indicating that this problem is resolving  Anemia:  Multifactorial.  Secondary to myeloma, renal insufficiency and bleeding (hemothorax)  Will follow Hgb and transfuse as needed for Hgb < 8 g/dL.  Phenotyping performed in preparation for starting daratumumab.  Hgb appears more stable with treatment of myeloma  Visual disturbances:  Likely due to hyperviscosity from myeloma.  Arranging for ophthalmology follow up  Deconditioning:  Arrange for PT by home health.  Patient is unable to leave home except for medical visits.    Social work consult:  To assist with disability paperwork    Cancer Staging  Multiple myeloma [facility]) Staging form: Plasma Cell Myeloma and Plasma Cell Disorders, AJCC 8th Edition - Clinical stage from 07/12/2022: RISS Stage III (Beta-2-microglobulin (mg/L): 17.7, Albumin (g/dL): 2.9, ISS: Stage III, High-risk cytogenetics: Present, LDH: Normal) - Signed by Barbee Cough, MD on 07/13/2022 Histopathologic type: Multiple myeloma Stage prefix: Initial diagnosis Beta 2 microglobulin range (mg/L): Greater than or equal to 5.5 Albumin range (g/dL): Less than 3.5 Cytogenetics: 17p deletion, t(11;14) translocation Lactate dehydrogenase (LDH) (U/L): 133 Serum calcium level: Elevated Pre-operative calcium level (mg/dL): 13.3 Serum creatinine level: Elevated Creatinine (mg/dL): 3.3 Bone disease on imaging: Present Hemoglobin (Hgb) (g/dL): 6.3 Pretreatment IgG (mg/dL): 215 Pretreatment IgA (mg/dL): 6,400 Pretreatment IgM (mg/dL): 13 Pretreatment monoclonal protein level in serum (M spike) (g/dL):  6 Pretreatment monoclonal protein level in 24-hour urine (M spike) (g): 1 Stage used in treatment planning: Yes National guidelines used in treatment planning: Yes   No problem-specific Assessment & Plan notes found for this encounter.   No orders of the defined types were placed in this encounter.    All questions were answered. The patient knows to call the clinic with any problems, questions or concerns.  This note was electronically signed.    Barbee Cough, MD  08/01/2022 12:50 PM

## 2022-08-01 NOTE — Telephone Encounter (Signed)
-----   Message from Georgette Shell, RN sent at 08/01/2022  4:25 PM EST ----- Regarding: FW: critical labs  Spoke to Dr. Federico Flake and brought her in early on 07/26/22 for fluids and possible consult with Dr. Orlene Erm.  ----- Message ----- From: Georgette Shell, RN Sent: 07/25/2022   9:37 AM EST To: Barbee Cough, MD Subject: critical labs                                  Critical Cre of 3.03, BUN 31.  You are scheduled to see her tomorrow 07/26/2022.

## 2022-08-02 ENCOUNTER — Other Ambulatory Visit: Payer: Self-pay

## 2022-08-02 ENCOUNTER — Telehealth: Payer: Self-pay

## 2022-08-02 MED ORDER — POTASSIUM CHLORIDE CRYS ER 20 MEQ PO TBCR: 20.0000 meq | EXTENDED_RELEASE_TABLET | Freq: Every day | ORAL | 1 refills | Status: DC

## 2022-08-02 MED FILL — Daratumumab-Hyaluronidase-fihj Inj 1800-30000 MG-Unit/15ML: SUBCUTANEOUS | Qty: 15 | Status: AC

## 2022-08-02 NOTE — Telephone Encounter (Signed)
-----  Message from Everett C Ribakove, MD sent at 08/02/2022 12:43 PM EST ----- Regarding: RE: Treatment conditions not met Will follow potassium level and advise Would give a month supply with refills  ----- Message ----- From: ,  N, CMA Sent: 08/02/2022  10:58 AM EST To: Brandy L Persson, RPH; Susan M Phy, RPH; # Subject: RE: Treatment conditions not met               How long do you want her to take it? ----- Message ----- From: Ribakove, Everett C, MD Sent: 08/02/2022   9:31 AM EST To: Brandy L Persson, RPH; Susan M Phy, RPH; # Subject: RE: Treatment conditions not met               Please have her start Kdur 20 mEq daily thanks ----- Message ----- From: Phy, Susan M, RPH Sent: 08/01/2022   9:22 PM EST To: Brandy L Persson, RPH;  N , CMA; # Subject: Treatment conditions not met                   Proceed with daratumumab on 11/30 despite ANC=600 and plt=21K?  Potassium was also 3.2.  Any PO or IV?     

## 2022-08-02 NOTE — Telephone Encounter (Signed)
Potassium 20 meq once daily has been sent to patient's pharmacy, [facility]. Detailed voicemail left to let patient know it has been sent in and that we will monitor her potassium to decide how long she will need to continue.

## 2022-08-02 NOTE — Telephone Encounter (Signed)
-----   Message from Barbee Cough, MD sent at 08/02/2022  9:27 AM EST ----- Sodium is low.  Please have patient drink Gatoraid or pedialyte instead Thanks

## 2022-08-02 NOTE — Progress Notes (Addendum)
Ok to treat on 08/03/22 with 08/01/22 ANC=600 and plt=21K per MD. MD rx'd KDur for K+ 3.2.  Raul Del Davenport, [place], BCPS, BCOP 08/02/2022 12:17 PM

## 2022-08-02 NOTE — Telephone Encounter (Signed)
Left detailed voicemail to start drinking Gatoraid or Pedialyte to help with sodium.

## 2022-08-03 ENCOUNTER — Ambulatory Visit: Payer: 59 | Admitting: Dietician

## 2022-08-03 ENCOUNTER — Inpatient Hospital Stay: Payer: 59

## 2022-08-03 VITALS — BP 117/79 | HR 99 | Temp 97.9°F | Resp 18 | Wt 176.1 lb

## 2022-08-03 DIAGNOSIS — Z5112 Encounter for antineoplastic immunotherapy: Secondary | ICD-10-CM | POA: Diagnosis not present

## 2022-08-03 DIAGNOSIS — C9000 Multiple myeloma not having achieved remission: Secondary | ICD-10-CM

## 2022-08-03 MED ORDER — MONTELUKAST SODIUM 10 MG PO TABS
10.0000 mg | ORAL_TABLET | Freq: Once | ORAL | Status: AC
  Administered 2022-08-03: 10 mg via ORAL
  Filled 2022-08-03: qty 1

## 2022-08-03 MED ORDER — DIPHENHYDRAMINE HCL 25 MG PO CAPS
50.0000 mg | ORAL_CAPSULE | Freq: Once | ORAL | Status: AC
  Administered 2022-08-03: 50 mg via ORAL
  Filled 2022-08-03: qty 2

## 2022-08-03 MED ORDER — DARATUMUMAB-HYALURONIDASE-FIHJ 1800-30000 MG-UT/15ML ~~LOC~~ SOLN
1800.0000 mg | Freq: Once | SUBCUTANEOUS | Status: AC
  Administered 2022-08-03: 1800 mg via SUBCUTANEOUS
  Filled 2022-08-03: qty 15

## 2022-08-03 MED ORDER — ACETAMINOPHEN 325 MG PO TABS
650.0000 mg | ORAL_TABLET | Freq: Once | ORAL | Status: AC
  Administered 2022-08-03: 650 mg via ORAL
  Filled 2022-08-03: qty 2

## 2022-08-03 MED ORDER — DEXAMETHASONE 4 MG PO TABS
20.0000 mg | ORAL_TABLET | Freq: Once | ORAL | Status: AC
  Administered 2022-08-03: 20 mg via ORAL
  Filled 2022-08-03: qty 5

## 2022-08-03 NOTE — Progress Notes (Signed)
Patient screened on MST. First attempt to reach, tried calling during infusion. Provided my cell# on voice mail and in text requesting call today or to return call to set up a nutrition consult.  April Manson, RDN, LDN Registered Dietitian, [place] Part Time Remote (Usual office hours: Tuesday-Thursday) Cell: [phone]

## 2022-08-03 NOTE — Patient Instructions (Signed)
Daratumumab Injection What is this medication? DARATUMUMAB (dar a toom ue mab) treats multiple myeloma, a type of bone marrow cancer. It works by helping your immune system slow or stop the spread of cancer cells. It is a monoclonal antibody. This medicine may be used for other purposes; ask your health care provider or pharmacist if you have questions. COMMON BRAND NAME(S): DARZALEX What should I tell my care team before I take this medication? They need to know if you have any of these conditions: Hereditary fructose intolerance Infection, such as chickenpox, herpes, hepatitis B virus Lung or breathing disease, such as asthma, COPD An unusual or allergic reaction to daratumumab, sorbitol, other medications, foods, dyes, or preservatives Pregnant or trying to get pregnant Breast-feeding How should I use this medication? This medication is injected into a vein. It is given by your care team in a hospital or clinic setting. Talk to your care team about the use of this medication in children. Special care may be needed. Overdosage: If you think you have taken too much of this medicine contact a poison control center or emergency room at once. NOTE: This medicine is only for you. Do not share this medicine with others. What if I miss a dose? Keep appointments for follow-up doses. It is important not to miss your dose. Call your care team if you are unable to keep an appointment. What may interact with this medication? Interactions have not been studied. This list may not describe all possible interactions. Give your health care provider a list of all the medicines, herbs, non-prescription drugs, or dietary supplements you use. Also tell them if you smoke, drink alcohol, or use illegal drugs. Some items may interact with your medicine. What should I watch for while using this medication? Your condition will be monitored carefully while you are receiving this medication. This medication can cause  serious allergic reactions. To reduce your risk, your care team may give you other medication to take before receiving this one. Be sure to follow the directions from your care team. This medication can affect the results of blood tests to match your blood type. These changes can last for up to 6 months after the final dose. Your care team will do blood tests to match your blood type before you start treatment. Tell all of your care team that you are being treated with this medication before receiving a blood transfusion. This medication can affect the results of some tests used to determine treatment response; extra tests may be needed to evaluate response. Talk to your care team if you wish to become pregnant or think you are pregnant. This medication can cause serious birth defects if taken during pregnancy and for 3 months after the last dose. A reliable form of contraception is recommended while taking this medication and for 3 months after the last dose. Talk to your care team about effective forms of contraception. Do not breast-feed while taking this medication. What side effects may I notice from receiving this medication? Side effects that you should report to your care team as soon as possible: Allergic reactions--skin rash, itching, hives, swelling of the face, lips, tongue, or throat Infection--fever, chills, cough, sore throat, wounds that don't heal, pain or trouble when passing urine, general feeling of discomfort or being unwell Infusion reactions--chest pain, shortness of breath or trouble breathing, feeling faint or lightheaded Unusual bruising or bleeding Side effects that usually do not require medical attention (report to your care team if they continue  or are bothersome): Constipation Diarrhea Fatigue Nausea Pain, tingling, or numbness in the hands or feet Swelling of the ankles, hands, or feet This list may not describe all possible side effects. Call your doctor for medical  advice about side effects. You may report side effects to FDA at 1-800-FDA-1088. Where should I keep my medication? This medication is given in a hospital or clinic. It will not be stored at home. NOTE: This sheet is a summary. It may not cover all possible information. If you have questions about this medicine, talk to your doctor, pharmacist, or health care provider.  2023 Elsevier/Gold Standard (2021-12-14 00:00:00)  

## 2022-08-03 NOTE — Progress Notes (Signed)
Patient and Patient's husband aware of new RX for KCL. Understand the need to take as directed.

## 2022-08-04 NOTE — Progress Notes (Signed)
After discussion with the provider, the patient will begin taking their premedications at home prior to daratumumab infusions. Premedications to be taken at home include Dexamethasone. A calendar will be given to the patient at next visit.  Laray Anger, PharmD PGY-2 Pharmacy Resident Hematology/Oncology [phone]  08/04/2022 12:03 PM

## 2022-08-07 ENCOUNTER — Encounter: Payer: Self-pay | Admitting: Oncology

## 2022-08-08 ENCOUNTER — Encounter: Payer: Self-pay | Admitting: Oncology

## 2022-08-08 ENCOUNTER — Inpatient Hospital Stay: Payer: 59

## 2022-08-08 ENCOUNTER — Telehealth: Payer: Self-pay

## 2022-08-08 ENCOUNTER — Other Ambulatory Visit: Payer: Self-pay

## 2022-08-08 ENCOUNTER — Inpatient Hospital Stay: Payer: 59 | Attending: Oncology | Admitting: Oncology

## 2022-08-08 VITALS — BP 136/61 | HR 98 | Temp 97.7°F | Resp 20 | Ht 66.4 in | Wt 175.2 lb

## 2022-08-08 DIAGNOSIS — C9000 Multiple myeloma not having achieved remission: Secondary | ICD-10-CM

## 2022-08-08 DIAGNOSIS — Z09 Encounter for follow-up examination after completed treatment for conditions other than malignant neoplasm: Secondary | ICD-10-CM

## 2022-08-08 DIAGNOSIS — M1990 Unspecified osteoarthritis, unspecified site: Secondary | ICD-10-CM | POA: Insufficient documentation

## 2022-08-08 DIAGNOSIS — E039 Hypothyroidism, unspecified: Secondary | ICD-10-CM | POA: Diagnosis not present

## 2022-08-08 DIAGNOSIS — M797 Fibromyalgia: Secondary | ICD-10-CM | POA: Insufficient documentation

## 2022-08-08 DIAGNOSIS — Z5112 Encounter for antineoplastic immunotherapy: Secondary | ICD-10-CM | POA: Diagnosis present

## 2022-08-08 DIAGNOSIS — D630 Anemia in neoplastic disease: Secondary | ICD-10-CM | POA: Diagnosis not present

## 2022-08-08 DIAGNOSIS — K5900 Constipation, unspecified: Secondary | ICD-10-CM | POA: Insufficient documentation

## 2022-08-08 DIAGNOSIS — R5383 Other fatigue: Secondary | ICD-10-CM | POA: Insufficient documentation

## 2022-08-08 DIAGNOSIS — E8889 Other specified metabolic disorders: Secondary | ICD-10-CM | POA: Insufficient documentation

## 2022-08-08 DIAGNOSIS — N179 Acute kidney failure, unspecified: Secondary | ICD-10-CM

## 2022-08-08 DIAGNOSIS — K219 Gastro-esophageal reflux disease without esophagitis: Secondary | ICD-10-CM | POA: Insufficient documentation

## 2022-08-08 DIAGNOSIS — G893 Neoplasm related pain (acute) (chronic): Secondary | ICD-10-CM

## 2022-08-08 DIAGNOSIS — Z79899 Other long term (current) drug therapy: Secondary | ICD-10-CM | POA: Diagnosis not present

## 2022-08-08 DIAGNOSIS — D759 Disease of blood and blood-forming organs, unspecified: Secondary | ICD-10-CM

## 2022-08-08 DIAGNOSIS — Z833 Family history of diabetes mellitus: Secondary | ICD-10-CM | POA: Diagnosis not present

## 2022-08-08 DIAGNOSIS — Z83511 Family history of glaucoma: Secondary | ICD-10-CM | POA: Insufficient documentation

## 2022-08-08 DIAGNOSIS — Z7189 Other specified counseling: Secondary | ICD-10-CM

## 2022-08-08 DIAGNOSIS — Z8349 Family history of other endocrine, nutritional and metabolic diseases: Secondary | ICD-10-CM | POA: Insufficient documentation

## 2022-08-08 DIAGNOSIS — Z8249 Family history of ischemic heart disease and other diseases of the circulatory system: Secondary | ICD-10-CM | POA: Diagnosis not present

## 2022-08-08 DIAGNOSIS — J942 Hemothorax: Secondary | ICD-10-CM

## 2022-08-08 LAB — SAMPLE TO BLOOD BANK

## 2022-08-08 LAB — CBC WITH DIFFERENTIAL (CANCER CENTER ONLY)
Abs Immature Granulocytes: 0.01 10*3/uL (ref 0.00–0.07)
Basophils Absolute: 0 10*3/uL (ref 0.0–0.1)
Basophils Relative: 1 %
Eosinophils Absolute: 0.1 10*3/uL (ref 0.0–0.5)
Eosinophils Relative: 6 %
HCT: 21.6 % — ABNORMAL LOW (ref 36.0–46.0)
Hemoglobin: 7 g/dL — ABNORMAL LOW (ref 12.0–15.0)
Immature Granulocytes: 1 %
Lymphocytes Relative: 26 %
Lymphs Abs: 0.3 10*3/uL — ABNORMAL LOW (ref 0.7–4.0)
MCH: 28.9 pg (ref 26.0–34.0)
MCHC: 32.4 g/dL (ref 30.0–36.0)
MCV: 89.3 fL (ref 80.0–100.0)
Monocytes Absolute: 0.1 10*3/uL (ref 0.1–1.0)
Monocytes Relative: 10 %
Neutro Abs: 0.6 10*3/uL — ABNORMAL LOW (ref 1.7–7.7)
Neutrophils Relative %: 56 %
Platelet Count: 26 10*3/uL — ABNORMAL LOW (ref 150–400)
RBC: 2.42 MIL/uL — ABNORMAL LOW (ref 3.87–5.11)
RDW: 18.8 % — ABNORMAL HIGH (ref 11.5–15.5)
WBC Count: 1 10*3/uL — ABNORMAL LOW (ref 4.0–10.5)
nRBC: 2 % — ABNORMAL HIGH (ref 0.0–0.2)

## 2022-08-08 LAB — CMP (CANCER CENTER ONLY)
ALT: 39 U/L (ref 0–44)
AST: 45 U/L — ABNORMAL HIGH (ref 15–41)
Albumin: 1.6 g/dL — ABNORMAL LOW (ref 3.5–5.0)
Alkaline Phosphatase: 46 U/L (ref 38–126)
Anion gap: 13 (ref 5–15)
BUN: 34 mg/dL — ABNORMAL HIGH (ref 6–20)
CO2: 16 mmol/L — ABNORMAL LOW (ref 22–32)
Calcium: 7.4 mg/dL — ABNORMAL LOW (ref 8.9–10.3)
Chloride: 101 mmol/L (ref 98–111)
Creatinine: 3.13 mg/dL (ref 0.44–1.00)
GFR, Estimated: 17 mL/min — ABNORMAL LOW (ref >60)
Glucose, Bld: 101 mg/dL — ABNORMAL HIGH (ref 70–99)
Potassium: 4.2 mmol/L (ref 3.5–5.1)
Sodium: 130 mmol/L — ABNORMAL LOW (ref 135–145)
Total Bilirubin: 1.2 mg/dL (ref 0.3–1.2)
Total Protein: 11.4 g/dL — ABNORMAL HIGH (ref 6.5–8.1)

## 2022-08-08 LAB — LACTATE DEHYDROGENASE: LDH: 127 U/L (ref 98–192)

## 2022-08-08 MED ORDER — AMOXICILLIN-POT CLAVULANATE 875-125 MG PO TABS: 1.0000 | ORAL_TABLET | Freq: Two times a day (BID) | ORAL | 1 refills | Status: AC

## 2022-08-08 MED ORDER — OLANZAPINE 5 MG PO TABS: 5.0000 mg | ORAL_TABLET | Freq: Every day | ORAL | 3 refills | Status: DC

## 2022-08-08 NOTE — Telephone Encounter (Signed)
I spoke with pt. I recognized right away, her mentation was more clear than last time we spoke. She reports that she restarts the Revlimid on Friday. She takes the medication @ night. "Only missed the doses he told me to", per pt. She was speaking of Dr Federico Flake. Pt denies fever, mouth sores, skin rash/itching, emesis, & diarrhea. She has intermittent nausea.   I did verify w/Dr Federico Flake that he had told her to hold some of the Revlimid.

## 2022-08-08 NOTE — Progress Notes (Unsigned)
[facility] up  Visit:  Patient Care Team: Earlyne Iba, NP as PCP - General (Nurse Practitioner) Barbee Cough, MD as Consulting Physician (Internal Medicine)  CHIEF COMPLAINTS/PURPOSE OF CONSULTATION:  Oncology History  Multiple myeloma ([place])  06/20/2022 Initial Diagnosis   Multiple myeloma ([place])   06/29/2022 - 07/13/2022 Chemotherapy   Patient is on Treatment Plan : MULTIPLE MYELOMA CyBorD - Weekly Bortezomib     07/12/2022 Cancer Staging   Staging form: Plasma Cell Myeloma and Plasma Cell Disorders, AJCC 8th Edition - Clinical stage from 07/12/2022: RISS Stage III (Beta-2-microglobulin (mg/L): 17.7, Albumin (g/dL): 2.9, ISS: Stage III, High-risk cytogenetics: Present, LDH: Normal) - Signed by Barbee Cough, MD on 07/13/2022 Histopathologic type: Multiple myeloma Stage prefix: Initial diagnosis Beta 2 microglobulin range (mg/L): Greater than or equal to 5.5 Albumin range (g/dL): Less than 3.5 Cytogenetics: 17p deletion, t(11;14) translocation Lactate dehydrogenase (LDH) (U/L): 133 Serum calcium level: Elevated Pre-operative calcium level (mg/dL): 13.3 Serum creatinine level: Elevated Creatinine (mg/dL): 3.3 Bone disease on imaging: Present Hemoglobin (Hgb) (g/dL): 6.3 Pretreatment IgG (mg/dL): 215 Pretreatment IgA (mg/dL): 6,400 Pretreatment IgM (mg/dL): 13 Pretreatment monoclonal protein level in serum (M spike) (g/dL): 6 Pretreatment monoclonal protein level in 24-hour urine (M spike) (g): 1 Stage used in treatment planning: Yes National guidelines used in treatment planning: Yes   07/20/2022 -  Chemotherapy   Patient is on Treatment Plan : MYELOMA NEWLY DIAGNOSED TRANSPLANT CANDIDATE DaraVRd (Daratumumab SQ) q21d x 6 Cycles (Induction/Consolidation)     09/01/2022 -  Chemotherapy   Patient is on Treatment Plan : MYELOMA RELAPSED/REFRACTORY Elotuzumab + Pomalidomide + Dexamethasone (EPd) q28d        HISTORY OF PRESENTING  ILLNESS: Kimberly Esparza 56 y.o. female is here because of  multiple myeloma Medical history notable for DHD, GERD, hypothyroidism, osteoarthritis, fibromyalgia, stomach polyps  June 14, 2022 through June 19 2022: Admitted to [facility] following several weeks of feeling poorly.  She has been to urgent care on several occasions during the weeks prior with complaints of facial pain and headache.  On each occasion she was given antibiotics and on 1 occasion Medrol Dosepak was added.  Patient was able to see PCP this week who drew labs which were noted to be abnormal she was subsequently directed to the emergency room. CT head demonstrated multiple lytic lesions throughout the bony calvarium suggesting neoplastic process.  Largest of these was in the left frontal calvarium.  There is a soft tissue mass extending from this large lytic lesion measuring 3.8 x 3.3 cm in size coarse calcifications extending into the scalp and extra-axial location left frontal region.  INR 1.2 PTT 26.3 WBC 9.1 hemoglobin 6.3 MCV 98 platelet count 73; 54 segs 4 bands 35 lymphs 3 monos 2 eos 2S.  Chemistries notable for sodium 132 creatinine 3.30 glucose 122 calcium 13.3 corrected to 14.4 ionized calcium 2.35 albumin 2.9 total protein 12.2 AST 63 ALT 73  Immunofixation showed a biclonal IgA kappa protein Serum free kappa 3067 lambda 2.3 with a kappa lambda 1333.  IgG 215 IgA >6400 IgM 13 Blood viscosity 5.0  24 hr urine showed loss of 1582 mg protein.  UPEP  showed loss of 1023 mg of paraprotein    Hypercalcemia was treated with dexamethasone, IV hydration, symptomatic and calcitonin  June 15 2022: CT chest abdomen pelvis contrast:  Diffuse moth-eaten appearance of the skeleton with bilateral  pathologic rib fractures of various ages. Expansile 2 cm inferior left scapular  lytic lesion. Small 2.0 cm soft tissue lesion in the right paraspinal region at the level of the fourth costovertebral  junction. Findings  are compatible with suspected multiple myeloma.  Small dependent left pleural effusion Trace pericardial effusion.  No lymphadenopathy.   June 16 2022: Port-A-Cath placed by radiology  June 17, 2022: Cardiac echo.  LVEF 60 to 65%.  Right atrium moderately dilated.  Moderate tricuspid valve regurg.  IVC dilated.  Pulmonary artery systolic pressure moderately elevated at 54 mmHg  June 19 2022: Right posterior iliac crest bone marrow biopsy and aspirate Extensive involvement by atypical plasma cells  representing 70% of all cells in the aspirate associated with variably sized aggregates and diffuse sheets in the clot and biopsy sections.  The plasma cells display kappa light chain restriction consistent with plasma cell neoplasm FISH analysis demonstrated a T p53 deletion as well as Cyclin D1/IgH fusion  June 22 2022:  Post hospital follow up for management of multiple myeloma.    Reviewed results of labs and bone marrow biopsy with patient and family.    June 28 2022:  Admitted to [facility] with bilateral pleural effusions, R >> L.  Therapeutic Right thoracocentesis yielded 2.2 liters of bloody pleural fluid with relief of symptoms.  Cytology showed no malignant cells  June 29, 2022: Discharged home so as to begin chemotherapy.  Cycle 1 day 1 CyBorD July 01 2022:  Presented to [facility] with SOB.   CT CAP showed  Large right hemothorax.  Trace left-sided pleural effusion, decreased in the interval.  New patchy ground-glass opacity anteriorly in the left upper lobe with nodular peripheral ground-glass opacity in the posterior left  upper lobe. Imaging features are nonspecific and may be  infectious/inflammatory Bibasilar collapse/consolidation, right greater than left and similar to 06/28/2022..  Numerous lucent lesions throughout the visualized bony anatomy  compatible with the reported clinical history of multiple myeloma.  Multiple bilateral rib fractures of  varying age, as before. Superior endplate compression deformity at T9 and T12 is stable.  No acute findings in the abdomen or pelvis.  Right Chest tube placed with recovery of bloody pleural fluid.  Labs notable for Hgb 4.8.  Patient was transferred to [facility] for acute management  July 02 2022:  Admitted to [facility].  Placed in ICU.  Chest tube removed as bloody output declined.   Transfused with 3 units PRBCs CT Chest without contrast showed  Right-sided chest tube in place with tip terminating in the right upper lobe with surrounding pulmonary hemorrhage. Similar to prior, the side-port terminates outside of the thoracic cavity. This tube is not well positioned to drain the remaining right-sided pleural fluid.  Moderate sized right hydropneumothorax.    July 08 2022:  WBC 2.6 Hgb 8.2 PLT 69; 39 seg 51 lymph 7 mono 2 eos 1 baso.  Cr 2.37   July 11 2022:  Post hospital follow up.  Having considerable back pain.  Also has pain in  right chest which was site of chest tube.  Fatigued.  Not sleeping well due to pain.  Using narcotic analgesics sparingly because she is concerned about developing addiction.  Has oxycodone,  gabapentin, Trazadone for pain.  Asked her to provide a tally of how many oxycodone tablets she has.  She requested considering addition of a muscle relaxer.   Reviewed results of bone marrow, SPEP with IEP, free light chain and FISH results with patient and her family   WBC 2.7 hemoglobin 7.6 platelet count 93;  32 segs 53 lymphs 6 monos 2 eos 1 basophil 6 immature  Chemistry is notable for creatinine 2.89 albumin 1.6 total protein 10.7.  SPEP with IEP demonstrated an M spike of 6.8 g/dL of paraprotein.  Serum free kappa 4673 lambda 2.8 with a kappa lambda 1453 IgG 188 IgA 4897 IgM 15  July 13 2022:  Cycle 1 Day 8 (delayed)  July 18 2022:  Scheduled follow up for management of multiple myeloma.  Reviewed results of labs with patient, her sister and mother.  She continues  to have fatigue and rib pain.  Taking oxycodone sparingly.  Discussed the use of low-dose long-acting MS Contin.  Since last visit I have also contacted Dr. Enzo Montgomery at [facility] to discuss autotransplant.  We will send records regarding patient.  Patient also having scotomas and blurry vision.  I contacted Dr. Dwana Melena from [facility] to arrange for a follow up.  There is concern that her visual disturbances are related to hyperviscosity from myeloma.  Needing help with disability paperwork.  Requesting home PT.    WBC 1.9 hemoglobin 7.3 platelet count 71 and RBC 1.6  July 19, 2022 received 1 unit of packed RBCs  July 20 2022:  Cycle 1 Day 1 Bortezomib/ Lenolidomide/Dex/ Dara July 21, 2022: WBC 1.7 hemoglobin 6.4 platelet count 62; 66 segs 20 lymphs 4 monos 2 eos  July 24, 2022:  Scheduled follow-up for management of multiple myeloma Had episodes of confusion since last visit Exacerbated by taking 1/2 tablets of MS Contin 15 mg for pain (Hold) Has also taken gabapentin.  (Hold) Have held xyzal since friday Not taking clolopin, trazodone, zyrtec, scopolamine patch  Here with husband and mother Husband does most of the talking  Discussed medication induced delerium To receive 2 units of packed red blood cells today  Cycle 1 Day 4 Bortezomib/ Lenolidomide/Dex/ Dara  WBC 1.7 hemoglobin 10.3 platelet count 55; 53 segs 37 lymphs 4 monos 3 eos 1 basophil 2 immature granulocytes CMP notable for creatinine 3.03 calcium 7.8 albumin 1.6 total protein 11.4 calculated GFR 17   July 26 2022:  Has been weaned off MS Contin, gabapenin, Xyzal etc since last visit.  Mentation clearer.  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  Not requiring narcotic analgesics.  Has diarrhea due to d/c of morphine and continued use of cathartics.  Will hold cathartics.   Appetite remains diminished.  Has dysgeusia but no nausea/ emesis.   Receiving IVF today.     Cycle 1 Day 8 Bortezomib/ Lenolidomide/Dex/ Dara  WBC 2.2 hemoglobin 9.4 platelet count 35 ANC 1.52 Chemistries notable for creatinine 3.1 alk phos 128 albumin 2.9  July 31 2022:  Cycle 1 Day 11 Bortezomib/ Lenolidomide/Dex/ Dara  August 01 2022:  Anorectic due to dysgeusia (things taste sweet) and nauseated without emesis.  Antiemetics not effective against nausea.  Not sleeping well at night.  Rib pain improving but head bump stable.   Using narcotic analgesics sparingly.  Has lost 2 lbs since last visit.  ECOG 2.  To meet with a counselor regarding disability paperwork.  Interested in obtaining a wig prior to bone marrow transplant WBC 1.1 hemoglobin 8.5 platelet count 21; 53 segs 34 lymphs 9 monos CMP notable for sodium of 128 potassium 3.2 creatinine 2.44 calcium 6.7 albumin 1.7   August 03, 2022: Cycle 1 Day 15 Bortezomib/ Lenolidomide/Dex/ Dara  August 08 2022:  Scheduled follow up for management of multiple myeloma. Some improvement in nausea  when holding Revlimid but did not hold it long enough to determine if appetite improved.  To see ophthalmologist; vision worse the last few days.   Not having much pain.  Not using narcotic analgesics.  Feels weak.  Has pimple on buttock. (Will place on Augmentin 875 mg po bid x 1 week)  Taking potassium tablets.  Have drawn repeat myeloma labs to assess if response to therapy.  At next visit order CT chest to assess hemothorax  August 10 2022:  Cycle 2 Day 1 Bortezomib/ Lenolidomide/Dex/ Dara  August 14 2022:  BMT consult [place]  Review of Systems  Constitutional:  Positive for fatigue. Negative for chills and fever.  HENT:   Negative for hearing loss, lump/mass, mouth sores, nosebleeds, sore throat, tinnitus, trouble swallowing and voice change.   Eyes:  Positive for eye problems. Negative for icterus.       Vision changes:  None  Respiratory:  Negative for chest tightness, cough, hemoptysis, shortness of breath and  wheezing.        PND:  none Orthopnea:  none   Cardiovascular:  Negative for chest pain, leg swelling and palpitations.       PND:  none Orthopnea:  none  Gastrointestinal:  Positive for constipation. Negative for abdominal distention, abdominal pain, blood in stool, diarrhea, nausea and vomiting.       Constipation improved with treatment of hypecalcemia  Endocrine: Negative for hot flashes.       Cold intolerance:  none Heat intolerance:  none  Genitourinary:  Negative for bladder incontinence, difficulty urinating, dysuria, frequency, hematuria and nocturia.   Musculoskeletal:  Positive for arthralgias and myalgias. Negative for back pain, gait problem, neck pain and neck stiffness.       Arthralgias in shoulders, ribs, back which makes sleeping difficult.  Uses Oxycodone infrequently since it does not provide much relief  Skin:  Negative for itching, rash and wound.  Neurological:  Positive for extremity weakness. Negative for dizziness, gait problem, headaches, light-headedness, numbness, seizures and speech difficulty.  Hematological:  Negative for adenopathy. Does not bruise/bleed easily.  Psychiatric/Behavioral:  Positive for sleep disturbance. Negative for confusion, decreased concentration and suicidal ideas. The patient is not nervous/anxious.        Concentration improving    MEDICAL HISTORY: Past Medical History:  Diagnosis Date   ADHD (attention deficit hyperactivity disorder)    Anxiety    Depression    GERD (gastroesophageal reflux disease)    Hypothyroidism    Multiple allergies    Osteoarthritis     SURGICAL HISTORY: Past Surgical History:  Procedure Laterality Date   ADENOIDECTOMY     BONE MARROW BIOPSY     CHEST TUBE INSERTION     Taken out after 3 days   KNEE SURGERY Bilateral    PARTIAL HYSTERECTOMY     SINOSCOPY     TONSILLECTOMY     TUBAL LIGATION     WRIST SURGERY Bilateral     SOCIAL HISTORY: Social History   Socioeconomic History    Marital status: Married    Spouse name: Carloyn Manner   Number of children: 2   Years of education: 14   Highest education level: Associate degree: academic program  Occupational History   Not on file  Tobacco Use   Smoking status: Never   Smokeless tobacco: Never  Vaping Use   Vaping Use: Never used  Substance and Sexual Activity   Alcohol use: Yes    Comment: rarely   Drug use:  Never   Sexual activity: Not Currently    Partners: Male    Comment: Married but no interest  Other Topics Concern   Not on file  Social History Narrative   Not on file   Social Determinants of Health   Financial Resource Strain: Not on file  Food Insecurity: Not on file  Transportation Needs: Not on file  Physical Activity: Not on file  Stress: Not on file  Social Connections: Not on file  Intimate Partner Violence: Not on file    FAMILY HISTORY Family History  Problem Relation Age of Onset   Hypertension Mother    Diabetes Mother    Glaucoma Mother    Gout Mother    Congestive Heart Failure Mother    Allergic rhinitis Father    Thyroid disease Father    Allergic rhinitis Brother    Gout Brother     ALLERGIES:  is allergic to doxycycline and tape.  MEDICATIONS:  Current Outpatient Medications  Medication Sig Dispense Refill   acyclovir (ZOVIRAX) 400 MG tablet Take 1 tablet (400 mg total) by mouth 2 (two) times daily. 60 tablet 3   amoxicillin (AMOXIL) 500 MG tablet      amoxicillin-clavulanate (AUGMENTIN) 875-125 MG tablet Take 1 tablet by mouth 2 (two) times daily for 28 days. 28 tablet 1   ASPIRIN 81 PO Take 81 mg by mouth daily.     azelastine (ASTELIN) 0.1 % nasal spray Place 2 sprays into both nostrils 2 (two) times daily as needed for rhinitis. Use in each nostril as directed 30 mL 3   dexamethasone (DECADRON) 4 MG tablet Take 4 mg by mouth daily.     dexamethasone (DECADRON) 4 MG tablet Take 8 mg po daily for 2 days after Cytoxan chemotherapy. 30 tablet 3   DEXILANT 60 MG capsule  Take 1 capsule by mouth daily.  0   fluticasone (FLONASE) 50 MCG/ACT nasal spray Place 1 spray into the nose daily.     lenalidomide (REVLIMID) 10 MG capsule Take 1 capsule (10 mg total) by mouth daily. Take for 14 days, then hold for 7 days. Repeat every 21 days. 14 capsule 0   levothyroxine (SYNTHROID) 88 MCG tablet Take 88 mcg by mouth daily.  3   montelukast (SINGULAIR) 10 MG tablet Take 1 tablet by mouth daily.     OLANZapine (ZYPREXA) 5 MG tablet Take 1 tablet (5 mg total) by mouth at bedtime. 30 tablet 3   ondansetron (ZOFRAN-ODT) 4 MG disintegrating tablet Take 4 mg by mouth every 8 (eight) hours as needed.     oxyCODONE (OXY IR/ROXICODONE) 5 MG immediate release tablet Take by mouth. Take 1 tablet (5 mg total) by mouth every 4 (four) hours as needed for Moderate pain (4-6) or Severe pain (7-10).     potassium chloride SA (KLOR-CON M) 20 MEQ tablet Take 1 tablet (20 mEq total) by mouth daily. 30 tablet 1   Wheat Dextrin (BENEFIBER PO) Take by mouth.     Current Facility-Administered Medications  Medication Dose Route Frequency Provider Last Rate Last Admin   acetaminophen (TYLENOL) tablet 650 mg  650 mg Oral Once Rosanne Sack A, PA-C       Facility-Administered Medications Ordered in Other Visits  Medication Dose Route Frequency Provider Last Rate Last Admin   bortezomib SQ (VELCADE) chemo injection (2.110m/mL concentration) 2.5 mg  1.3 mg/m2 (Treatment Plan Recorded) Subcutaneous Once RBarbee Cough MD       daratumumab-hyaluronidase-fihj ([facility]-ErFASPRO) 1800-30000 MG-UT/15ML chemo  SQ injection 1,800 mg  1,800 mg Subcutaneous Once , Gregary Signs, MD        PHYSICAL EXAMINATION:  ECOG PERFORMANCE STATUS: 2 - Symptomatic, <50% confined to bed   Vitals:   08/08/22 1343  BP: 136/61  Pulse: 98  Resp: 20  Temp: 97.7 F (36.5 C)  SpO2: 97%      Filed Weights   08/08/22 1343  Weight: 175 lb 3.2 oz (79.5 kg)       Physical Exam Vitals and nursing note  reviewed.  Constitutional:      General: She is not in acute distress.    Appearance: Normal appearance. She is obese. She is not toxic-appearing or diaphoretic.     Comments: Here with brother.  Tired but fairly comfortable appearing.    HENT:     Head: Normocephalic and atraumatic.     Right Ear: External ear normal.     Left Ear: External ear normal.     Nose: Nose normal. No congestion or rhinorrhea.     Mouth/Throat:     Mouth: Mucous membranes are moist.  Eyes:     General: No scleral icterus.    Extraocular Movements: Extraocular movements intact.     Conjunctiva/sclera: Conjunctivae normal.     Pupils: Pupils are equal, round, and reactive to light.  Cardiovascular:     Rate and Rhythm: Normal rate and regular rhythm.     Heart sounds: Normal heart sounds. No murmur heard.    No friction rub. No gallop.  Pulmonary:     Effort: Pulmonary effort is normal. No respiratory distress.     Breath sounds: Normal breath sounds. No stridor. No wheezing or rales.  Chest:     Chest wall: No tenderness.  Abdominal:     General: Bowel sounds are normal. There is no distension.     Palpations: Abdomen is soft.     Tenderness: There is no abdominal tenderness. There is no guarding or rebound.  Musculoskeletal:        General: No swelling, tenderness or deformity.     Cervical back: Normal range of motion and neck supple. No rigidity or tenderness.     Right lower leg: No edema.     Left lower leg: No edema.  Lymphadenopathy:     Head:     Right side of head: No submental, submandibular, tonsillar, preauricular, posterior auricular or occipital adenopathy.     Left side of head: No submental, submandibular, tonsillar, preauricular, posterior auricular or occipital adenopathy.     Cervical: No cervical adenopathy.     Right cervical: No superficial, deep or posterior cervical adenopathy.    Left cervical: No superficial, deep or posterior cervical adenopathy.     Upper Body:      Right upper body: No supraclavicular, axillary, pectoral or epitrochlear adenopathy.     Left upper body: No supraclavicular, axillary, pectoral or epitrochlear adenopathy.  Skin:    General: Skin is warm.     Coloration: Skin is not jaundiced.     Findings: No bruising, erythema or rash.  Neurological:     General: No focal deficit present.     Mental Status: She is alert and oriented to person, place, and time. Mental status is at baseline.     Cranial Nerves: No cranial nerve deficit.     Sensory: No sensory deficit.  Psychiatric:        Mood and Affect: Mood normal.        Behavior:  Behavior normal.        Thought Content: Thought content normal.        Judgment: Judgment normal.      LABORATORY DATA: I have personally reviewed the data as listed:  Orders Only on 08/08/2022  Component Date Value Ref Range Status   Kappa free light chain 08/08/2022 5,957.8 (H)  3.3 - 19.4 mg/L Final   Lambda free light chains 08/08/2022 <1.5 (L)  5.7 - 26.3 mg/L Final   Kappa, lambda light chain ratio 08/08/2022 Note: (A)  0.26 - 1.65 Final   Comment: (NOTE) >3971.87 Performed At: [facility] [place], [place] [id number] Rush Farmer MD IN:[id number]   Office Visit on 08/08/2022  Component Date Value Ref Range Status   Blood Bank Specimen 08/08/2022 SAMPLE AVAILABLE FOR TESTING   Final   Sample Expiration 08/08/2022    Final                   Value:08/11/2022,2359 Performed at [facility], [place] [address], [place], [place] [address]    LDH 08/08/2022 127  98 - 192 U/L Final   Performed at [facility], [place] [address], [place], [place] [address]   Beta-2 Microglobulin 08/08/2022 36.6 (H)  0.6 - 2.4 mg/L Final   Comment: (NOTE) **Results verified by repeat testing** Siemens Immulite 2000 Immunochemiluminometric assay (ICMA) Values obtained with different assay methods or kits cannot be used interchangeably. Results cannot  be interpreted as absolute evidence of the presence or absence of malignant disease. Performed At: [facility] [place], [place] [id number] Rush Farmer MD TW:[id number]   Clinical Support on 08/08/2022  Component Date Value Ref Range Status   WBC Count 08/08/2022 1.0 (L)  4.0 - 10.5 K/uL Final   Comment: REPEATED TO VERIFY WHITE COUNT CONFIRMED ON SMEAR THIS CRITICAL RESULT HAS VERIFIED AND BEEN CALLED TO RN KATRINA D BY [facility] [place] ON 12 05 2023 AT 72, AND HAS BEEN READ BACK.     RBC 08/08/2022 2.42 (L)  3.87 - 5.11 MIL/uL Final   Hemoglobin 08/08/2022 7.0 (L)  12.0 - 15.0 g/dL Final   HCT 08/08/2022 21.6 (L)  36.0 - 46.0 % Final   MCV 08/08/2022 89.3  80.0 - 100.0 fL Final   MCH 08/08/2022 28.9  26.0 - 34.0 pg Final   MCHC 08/08/2022 32.4  30.0 - 36.0 g/dL Final   RDW 08/08/2022 18.8 (H)  11.5 - 15.5 % Final   Platelet Count 08/08/2022 26 (L)  150 - 400 K/uL Final   Comment: SPECIMEN CHECKED FOR CLOTS Immature Platelet Fraction may be clinically indicated, consider ordering this additional test [id number] REPEATED TO VERIFY PLATELET COUNT CONFIRMED BY SMEAR    nRBC 08/08/2022 2.0 (H)  0.0 - 0.2 % Final   Neutrophils Relative % 08/08/2022 56  % Final   Neutro Abs 08/08/2022 0.6 (L)  1.7 - 7.7 K/uL Final   Lymphocytes Relative 08/08/2022 26  % Final   Lymphs Abs 08/08/2022 0.3 (L)  0.7 - 4.0 K/uL Final   Monocytes Relative 08/08/2022 10  % Final   Monocytes Absolute 08/08/2022 0.1  0.1 - 1.0 K/uL Final   Eosinophils Relative 08/08/2022 6  % Final   Eosinophils Absolute 08/08/2022 0.1  0.0 - 0.5 K/uL Final   Basophils Relative 08/08/2022 1  % Final   Basophils Absolute 08/08/2022 0.0  0.0 - 0.1 K/uL Final   Immature Granulocytes 08/08/2022 1  % Final  Abs Immature Granulocytes 08/08/2022 0.01  0.00 - 0.07 K/uL Final   [place] 08/08/2022 PRESENT   Final   Performed at [facility], [place] [address] [place], [place]  [address]   Sodium 08/08/2022 130 (L)  135 - 145 mmol/L Final   Potassium 08/08/2022 4.2  3.5 - 5.1 mmol/L Final   Chloride 08/08/2022 101  98 - 111 mmol/L Final   CO2 08/08/2022 16 (L)  22 - 32 mmol/L Final   Glucose, Bld 08/08/2022 101 (H)  70 - 99 mg/dL Final   Glucose reference range applies only to samples taken after fasting for at least 8 hours.   BUN 08/08/2022 34 (H)  6 - 20 mg/dL Final   Creatinine 08/08/2022 3.13 (HH)  0.44 - 1.00 mg/dL Final   CRITICAL RESULT CALLED TO, READ BACK BY AND VERIFIED WITH Janice Norrie, RN AT 1619 08/08/2022 BY K. DAVIS   Calcium 08/08/2022 7.4 (L)  8.9 - 10.3 mg/dL Final   Total Protein 08/08/2022 11.4 (H)  6.5 - 8.1 g/dL Final   Albumin 08/08/2022 1.6 (L)  3.5 - 5.0 g/dL Final   AST 08/08/2022 45 (H)  15 - 41 U/L Final   ALT 08/08/2022 39  0 - 44 U/L Final   Alkaline Phosphatase 08/08/2022 46  38 - 126 U/L Final   Total Bilirubin 08/08/2022 1.2  0.3 - 1.2 mg/dL Final   GFR, Estimated 08/08/2022 17 (L)  >60 mL/min Final   Comment: (NOTE) Calculated using the CKD-EPI Creatinine Equation (2021)    Anion gap 08/08/2022 13  5 - 15 Final   Performed at [facility], [place] [address], [place], [place] [address]   ABO/RH(D) 08/08/2022 O POS   Final   Antibody Screen 08/08/2022 POS   Final   Sample Expiration 08/08/2022 08/11/2022,2359   Final   Antibody Identification 76/54/6503 NON SPECIFIC ANTIBODY REACTIVITY   Final   Unit Number 08/08/2022 [id number]   Final   Blood Component Type 08/08/2022 RED CELLS,LR   Final   Unit division 08/08/2022 00   Final   Status of Unit 08/08/2022 ISSUED   Final   Donor AG Type 08/08/2022 NEGATIVE FOR E ANTIGEN NEGATIVE FOR KELL ANTIGEN   Final   Transfusion Status 08/08/2022 OK TO TRANSFUSE   Final   Crossmatch Result 08/08/2022 COMPATIBLE   Final   Unit Number 08/08/2022 [id number]   Final   Blood Component Type 08/08/2022 RED CELLS,LR   Final   Unit division 08/08/2022 00   Final   Status of  Unit 08/08/2022 ISSUED   Final   Donor AG Type 08/08/2022 NEGATIVE FOR E ANTIGEN NEGATIVE FOR KELL ANTIGEN   Final   Transfusion Status 08/08/2022 OK TO TRANSFUSE   Final   Crossmatch Result 08/08/2022 COMPATIBLE   Final   Order Confirmation 08/08/2022    Final                   Value:ORDER PROCESSED BY BLOOD BANK Performed at [facility], [place] [address] [place], [place] [address]    ISSUE DATE / TIME 08/08/2022 [id number]   Final   Blood Product Unit Number 08/08/2022 [id number]   Final   PRODUCT CODE 08/08/2022 [id number]   Final   Unit Type and Rh 08/08/2022 5100   Final   Blood Product Expiration Date 08/08/2022 [id number]   Final   ISSUE DATE / TIME 08/08/2022 [id number]   Final   Blood Product Unit Number 08/08/2022 [id number]  Final   PRODUCT CODE 08/08/2022 [id number]   Final   Unit Type and Rh 08/08/2022 5100   Final   Blood Product Expiration Date 08/08/2022 [id number]   Final  Abstract on 08/07/2022  Component Date Value Ref Range Status   Hemoglobin 07/26/2022 9.4 (A)  12.0 - 16.0 Final   HCT 07/26/2022 27 (A)  36 - 46 Final   Neutrophils Absolute 07/26/2022 1.52   Final   Platelets 07/26/2022 35 (A)  150 - 400 K/uL Final   WBC 07/26/2022 2.2   Final   RBC 07/26/2022 3.17 (A)  3.87 - 5.11 Final  Abstract on 08/07/2022  Component Date Value Ref Range Status   Glucose 07/26/2022 98   Final   BUN 07/26/2022 27 (A)  4 - 21 Final   CO2 07/26/2022 16  13 - 22 Final   Creatinine 07/26/2022 3.1 (A)  0.5 - 1.1 Final   Potassium 07/26/2022 3.5  3.5 - 5.1 mEq/L Final   Sodium 07/26/2022 134 (A)  137 - 147 Final   Chloride 07/26/2022 100  99 - 108 Final   Calcium 07/26/2022 8.3 (A)  8.7 - 10.7 Final   Albumin 07/26/2022 2.9 (A)  3.5 - 5.0 Final  Appointment on 08/01/2022  Component Date Value Ref Range Status   WBC Count 08/01/2022 1.1 (L)  4.0 - 10.5 K/uL Final   Comment: REPEATED TO VERIFY WHITE COUNT CONFIRMED ON SMEAR THIS CRITICAL RESULT  HAS VERIFIED AND BEEN CALLED TO RN W WILLIAM BY ALEXIS Dona Ana ON 11 28 2023 AT [place], AND HAS BEEN READ BACK.     RBC 08/01/2022 3.01 (L)  3.87 - 5.11 MIL/uL Final   Hemoglobin 08/01/2022 8.5 (L)  12.0 - 15.0 g/dL Final   HCT 08/01/2022 26.4 (L)  36.0 - 46.0 % Final   MCV 08/01/2022 87.7  80.0 - 100.0 fL Final   MCH 08/01/2022 28.2  26.0 - 34.0 pg Final   MCHC 08/01/2022 32.2  30.0 - 36.0 g/dL Final   RDW 08/01/2022 18.7 (H)  11.5 - 15.5 % Final   Platelet Count 08/01/2022 21 (L)  150 - 400 K/uL Final   Comment: SPECIMEN CHECKED FOR CLOTS Immature Platelet Fraction may be clinically indicated, consider ordering this additional test [id number] REPEATED TO VERIFY THIS CRITICAL RESULT HAS VERIFIED AND BEEN CALLED TO RN W WILLIAM BY ALEXIS Dickinson ON 11 28 2023 AT [place], AND HAS BEEN READ BACK.     nRBC 08/01/2022 0.0  0.0 - 0.2 % Final   Neutrophils Relative % 08/01/2022 53  % Final   Neutro Abs 08/01/2022 0.6 (L)  1.7 - 7.7 K/uL Final   Lymphocytes Relative 08/01/2022 34  % Final   Lymphs Abs 08/01/2022 0.4 (L)  0.7 - 4.0 K/uL Final   Monocytes Relative 08/01/2022 9  % Final   Monocytes Absolute 08/01/2022 0.1  0.1 - 1.0 K/uL Final   Eosinophils Relative 08/01/2022 3  % Final   Eosinophils Absolute 08/01/2022 0.0  0.0 - 0.5 K/uL Final   Basophils Relative 08/01/2022 1  % Final   Basophils Absolute 08/01/2022 0.0  0.0 - 0.1 K/uL Final   Immature Granulocytes 08/01/2022 0  % Final   Abs Immature Granulocytes 08/01/2022 0.00  0.00 - 0.07 K/uL Final   Rouleaux 08/01/2022 PRESENT   Final   Performed at [facility], [place] [address], [place], [place] [address]   Sodium 08/01/2022 128 (L)  135 - 145 mmol/L Final   Potassium 08/01/2022 3.2 (  L)  3.5 - 5.1 mmol/L Final   Chloride 08/01/2022 96 (L)  98 - 111 mmol/L Final   CO2 08/01/2022 16 (L)  22 - 32 mmol/L Final   Glucose, Bld 08/01/2022 73  70 - 99 mg/dL Final   Glucose reference range applies only to samples  taken after fasting for at least 8 hours.   BUN 08/01/2022 25 (H)  6 - 20 mg/dL Final   Creatinine 08/01/2022 2.44 (H)  0.44 - 1.00 mg/dL Final   Calcium 08/01/2022 6.7 (L)  8.9 - 10.3 mg/dL Final   Total Protein 08/01/2022 11.2 (H)  6.5 - 8.1 g/dL Final   Albumin 08/01/2022 1.7 (L)  3.5 - 5.0 g/dL Final   AST 08/01/2022 31  15 - 41 U/L Final   ALT 08/01/2022 19  0 - 44 U/L Final   Alkaline Phosphatase 08/01/2022 45  38 - 126 U/L Final   Total Bilirubin 08/01/2022 0.8  0.3 - 1.2 mg/dL Final   GFR, Estimated 08/01/2022 23 (L)  >60 mL/min Final   Comment: (NOTE) Calculated using the CKD-EPI Creatinine Equation (2021)    Anion gap 08/01/2022 16 (H)  5 - 15 Final   Performed at [facility], [place] [address] [place], [place] [address]   Blood Bank Specimen 08/01/2022 SAMPLE AVAILABLE FOR TESTING   Final   Sample Expiration 08/01/2022    Final                   Value:08/04/2022,2359 Performed at [facility], [place] [address], [place],  [address]   Office Visit on 07/26/2022  Component Date Value Ref Range Status   Hemoglobin 07/26/2022 9.4 (A)  12.0 - 16.0 Final   HCT 07/26/2022 27 (A)  36 - 46 Final   Neutrophils Absolute 07/26/2022 1.52   Final   Platelets 07/26/2022 35 (A)  150 - 400 K/uL Final   WBC 07/26/2022 2.2   Final   RBC 07/26/2022 3.17 (A)  3.87 - 5.11 Final   Glucose 07/26/2022 98   Final   BUN 07/26/2022 27 (A)  4 - 21 Final   CO2 07/26/2022 16  13 - 22 Final   Creatinine 07/26/2022 3.1 (A)  0.5 - 1.1 Final   Potassium 07/26/2022 3.5  3.5 - 5.1 mEq/L Final   Sodium 07/26/2022 134 (A)  137 - 147 Final   Chloride 07/26/2022 100  99 - 108 Final   Calcium 07/26/2022 8.3 (A)  8.7 - 10.7 Final   Albumin 07/26/2022 2.9 (A)  3.5 - 5.0 Final   Alkaline Phosphatase 07/26/2022 128 (A)  25 - 125 Final   ALT 07/26/2022 28  7 - 35 U/L Final   AST 07/26/2022 34  13 - 35 Final   Bilirubin, Total 07/26/2022 0.9   Final  Appointment on  07/24/2022  Component Date Value Ref Range Status   WBC Count 07/24/2022 1.7 (L)  4.0 - 10.5 K/uL Final   RBC 07/24/2022 3.62 (L)  3.87 - 5.11 MIL/uL Final   Hemoglobin 07/24/2022 10.3 (L)  12.0 - 15.0 g/dL Final   Comment: REPEATED TO VERIFY POST TRANSFUSION SPECIMEN    HCT 07/24/2022 32.1 (L)  36.0 - 46.0 % Final   MCV 07/24/2022 88.7  80.0 - 100.0 fL Final   MCH 07/24/2022 28.5  26.0 - 34.0 pg Final   MCHC 07/24/2022 32.1  30.0 - 36.0 g/dL Final   RDW 07/24/2022 17.5 (H)  11.5 - 15.5 % Final  Platelet Count 07/24/2022 55 (L)  150 - 400 K/uL Final   Comment: Immature Platelet Fraction may be clinically indicated, consider ordering this additional test [id number] CONSISTENT WITH PREVIOUS RESULT REPEATED TO VERIFY    nRBC 07/24/2022 14.8 (H)  0.0 - 0.2 % Final   Neutrophils Relative % 07/24/2022 53  % Final   Neutro Abs 07/24/2022 0.9 (L)  1.7 - 7.7 K/uL Final   Lymphocytes Relative 07/24/2022 37  % Final   Lymphs Abs 07/24/2022 0.6 (L)  0.7 - 4.0 K/uL Final   Monocytes Relative 07/24/2022 4  % Final   Monocytes Absolute 07/24/2022 0.1  0.1 - 1.0 K/uL Final   Eosinophils Relative 07/24/2022 3  % Final   Eosinophils Absolute 07/24/2022 0.1  0.0 - 0.5 K/uL Final   Basophils Relative 07/24/2022 1  % Final   Basophils Absolute 07/24/2022 0.0  0.0 - 0.1 K/uL Final   WBC Morphology 07/24/2022 VACUOLATED NEUTROPHILS   Final   RBC Morphology 07/24/2022 MORPHOLOGY UNREMARKABLE   Final   Smear Review 07/24/2022 MORPHOLOGY UNREMARKABLE   Final   Immature Granulocytes 07/24/2022 2  % Final   Abs Immature Granulocytes 07/24/2022 0.03  0.00 - 0.07 K/uL Final   Rouleaux 07/24/2022 PRESENT   Final   Performed at [facility], [place] [address] [place], [place] [address]   Sodium 07/24/2022 135  135 - 145 mmol/L Final   Potassium 07/24/2022 3.7  3.5 - 5.1 mmol/L Final   Chloride 07/24/2022 102  98 - 111 mmol/L Final   CO2 07/24/2022 20 (L)  22 - 32 mmol/L Final   Glucose,  Bld 07/24/2022 82  70 - 99 mg/dL Final   Glucose reference range applies only to samples taken after fasting for at least 8 hours.   BUN 07/24/2022 31 (H)  6 - 20 mg/dL Final   Creatinine 07/24/2022 3.03 (HH)  0.44 - 1.00 mg/dL Final   CRITICAL RESULT CALLED TO, READ BACK BY AND VERIFIED WITH LEWIS, MD 07/24/22 1837 J[address] COLE   Calcium 07/24/2022 7.8 (L)  8.9 - 10.3 mg/dL Final   Total Protein 07/24/2022 11.4 (H)  6.5 - 8.1 g/dL Final   Albumin 07/24/2022 1.6 (L)  3.5 - 5.0 g/dL Final   AST 07/24/2022 38  15 - 41 U/L Final   ALT 07/24/2022 31  0 - 44 U/L Final   Alkaline Phosphatase 07/24/2022 51  38 - 126 U/L Final   Total Bilirubin 07/24/2022 0.9  0.3 - 1.2 mg/dL Final   GFR, Estimated 07/24/2022 17 (L)  >60 mL/min Final   Comment: (NOTE) Calculated using the CKD-EPI Creatinine Equation (2021)    Anion gap 07/24/2022 13  5 - 15 Final   Performed at [facility], [place] [address] [place], [place] [address]   Blood Bank Specimen 07/24/2022 SAMPLE AVAILABLE FOR TESTING   Final   Sample Expiration 07/24/2022    Final                   Value:07/27/2022,2359 Performed at [facility], [place] [address], [place], [place] [address]   Clinical Support on 07/21/2022  Component Date Value Ref Range Status   WBC Count 07/21/2022 1.7 (L)  4.0 - 10.5 K/uL Final   RBC 07/21/2022 2.29 (L)  3.87 - 5.11 MIL/uL Final   Hemoglobin 07/21/2022 6.4 (LL)  12.0 - 15.0 g/dL Final   Comment: REPEATED TO VERIFY THIS CRITICAL RESULT HAS VERIFIED AND BEEN CALLED TO DUNLAP,K. RN  BY NICOLE MCCOY ON 11 17 2023 AT 1336, AND HAS BEEN READ BACK. CRITICA L RESULT VERIFIED    HCT 07/21/2022 20.4 (L)  36.0 - 46.0 % Final   MCV 07/21/2022 89.1  80.0 - 100.0 fL Final   MCH 07/21/2022 27.9  26.0 - 34.0 pg Final   MCHC 07/21/2022 31.4  30.0 - 36.0 g/dL Final   RDW 07/21/2022 18.1 (H)  11.5 - 15.5 % Final   Platelet Count 07/21/2022 62 (L)  150 - 400 K/uL Final   Comment: Immature Platelet  Fraction may be clinically indicated, consider ordering this additional test [id number] CONSISTENT WITH PREVIOUS RESULT    nRBC 07/21/2022 1.8 (H)  0.0 - 0.2 % Final   Neutrophils Relative % 07/21/2022 66  % Final   Neutro Abs 07/21/2022 1.1 (L)  1.7 - 7.7 K/uL Final   Lymphocytes Relative 07/21/2022 28  % Final   Lymphs Abs 07/21/2022 0.5 (L)  0.7 - 4.0 K/uL Final   Monocytes Relative 07/21/2022 4  % Final   Monocytes Absolute 07/21/2022 0.1  0.1 - 1.0 K/uL Final   Eosinophils Relative 07/21/2022 0  % Final   Eosinophils Absolute 07/21/2022 0.0  0.0 - 0.5 K/uL Final   Basophils Relative 07/21/2022 0  % Final   Basophils Absolute 07/21/2022 0.0  0.0 - 0.1 K/uL Final   Immature Granulocytes 07/21/2022 2  % Final   Abs Immature Granulocytes 07/21/2022 0.04  0.00 - 0.07 K/uL Final   Rouleaux 07/21/2022 PRESENT   Final   Performed at [facility], [place] [address] [place], [place] [address]   Blood Bank Specimen 07/21/2022 SAMPLE AVAILABLE FOR TESTING   Final   Sample Expiration 07/21/2022    Final                   Value:07/24/2022,2359 Performed at [facility], [place] [address], [place], [place] [address]    ABO/RH(D) 07/21/2022 O POS   Final   Antibody Screen 07/21/2022 POS   Final   Sample Expiration 07/21/2022 07/24/2022,2359   Final   Antibody Identification 52/77/8242 NON SPECIFIC ANTIBODY REACTIVITY   Final   Unit Number 07/21/2022 [id number]   Final   Blood Component Type 07/21/2022 RED CELLS,LR   Final   Unit division 07/21/2022 00   Final   Status of Unit 07/21/2022 ISSUED,FINAL   Final   Transfusion Status 07/21/2022 OK TO TRANSFUSE   Final   Crossmatch Result 07/21/2022 COMPATIBLE   Final   Donor AG Type 07/21/2022 NEGATIVE FOR E ANTIGEN NEGATIVE FOR KELL ANTIGEN NEGATIVE FOR DUFFY A ANTIGEN NEGATIVE FOR S ANTIGEN   Final   Unit Number 07/21/2022 [id number]   Final   Blood Component Type 07/21/2022 RED CELLS,LR   Final   Unit  division 07/21/2022 00   Final   Status of Unit 07/21/2022 ISSUED,FINAL   Final   Donor AG Type 07/21/2022 NEGATIVE FOR E ANTIGEN NEGATIVE FOR KELL ANTIGEN NEGATIVE FOR S ANTIGEN NEGATIVE FOR DUFFY A ANTIGEN   Final   Transfusion Status 07/21/2022 OK TO TRANSFUSE   Final   Crossmatch Result 07/21/2022 COMPATIBLE   Final   ISSUE DATE / TIME 07/21/2022 [id number]   Final   Blood Product Unit Number 07/21/2022 [id number]   Final   PRODUCT CODE 07/21/2022 [id number]   Final   Unit Type and Rh 07/21/2022 5100   Final   Blood Product Expiration Date 07/21/2022 [id number]   Final   ISSUE DATE / TIME  07/21/2022 [id number]   Final   Blood Product Unit Number 07/21/2022 [id number]   Final   PRODUCT CODE 07/21/2022 [id number]   Final   Unit Type and Rh 07/21/2022 5100   Final   Blood Product Expiration Date 07/21/2022 [id number]   Final   Order Confirmation 07/23/2022    Final                   Value:ORDER PROCESSED BY BLOOD BANK Performed at [facility], [place] [address], [place], [place] [address]   Office Visit on 07/18/2022  Component Date Value Ref Range Status   PT AG Type 07/12/2022    Final                   Value:POSITIVE FOR c ANTIGEN POSITIVE FOR C ANTIGEN NEGATIVE FOR E ANTIGEN POSITIVE FOR e ANTIGEN NEGATIVE FOR KELL ANTIGEN NEGATIVE FOR DUFFY A ANTIGEN POSITIVE FOR DUFFY B ANTIGEN POSITIVE FOR KIDD A ANTIGEN POSITIVE FOR KIDD B ANTIGEN NEGATIVE FOR S ANTIGEN  POSITIVE FOR s ANTIGEN POSITIVE FOR M ANTIGEN Performed at [facility], [place] [address] [place],  [address]   There may be more visits with results that are not included.    RADIOGRAPHIC STUDIES: I have personally reviewed the radiological images as listed and agree with the findings in the report  No results found.  ASSESSMENT/PLAN 56 year old female who on June 14 2022 presented with acute renal failure, hypercalcemia, elevated total protein in setting of  hypoalbuminemia, cytopenias (anemia, thrombocytopenia), lytic bone lesions which has lead to the diagnosis of multiple myeloma.    Multiple myeloma ISS-Stage III, Biclonal IgA kappa + kappa light chain escape/ FISH with TP 53 deletion and Cyclin D1/IgH fusion:  Diagnosed in October 2023 after patient presented with hypercalcemia, AKI, wide spread lytic lesions in skeleton, cytopenias.  At presentation serum free kappa 3067 lambda 2.3 with a kappa lambda 1333. IgA >6400 Blood viscosity 5.0  24 hr urine showed loss of 1582 mg protein.  UPEP  showed loss of 1023 mg of paraprotein.   BM Bx showed extensive involvement by plasma cells with kappa restriction.    Therapeutics:  Patient has high risk disease and is a stem cell transplant candidate  June 29, 2022: Cycle 1 Day 1 CyBorD.  Initiation of chemotherapy delayed due to insurance reasons.  Cycle 1 Day 8 interrupted because of hemothorax July 13 2022: Cycle 1 Day 8 CyBorD.  Labs notable for improvement in IgA M spike but increased kappa light chains.   July 20 2022:  Cycle 1 Vrd + Dara. Revlimid dose 10 mg daily Days 1 to 14 due to decreased renal function.  Will observe closely for side effects from Revlimid due to decreased renal fxn  August 01 2022:  Instructed patient to hold Revlimid for several days as it may be contributing to Nausea August 08 2022:  Nausea improved a bit off Revlimid.  To continue to hold.  Repeating myeloma labs to assess if response.   Not encouraged by the fact that serum protein remains high, albumin low and remains transfusion dependent. August 14 2022:  Transplant evaluation at [facility]  Hypercalcemia:  Secondary to myeloma.  Was treated with IVF, calcitonin, Dexamethasone, Zometa. Improved with therapy of myeloma.  On Xgeva instead of Zometa due to CKD.        Pulmonary HTN:  Has been reported in Multiple Myeloma.  Multifactorial can be related to caused  by pulmonary vasculopathy, endothelial  dysfunction, pulmonary vascular amyloidosis, hyperviscosity.  Noted to have hyperviscosity on labs drawn during the hospitalization.  Not hypoxemic so doubt PE.    August 08 2022:  Ordering serum viscosity  Cancer related pain:  Due to bone lesions from myeloma.  Will improve with treatment.   July 18 2022:  Adding MS Contin 15 mg po bid.      July 26 2022:  Stopped MS Contin due to delirium August 01 2022:  Using Oxycodone sparingly because bone pain improved August 08 2022:  Not using Oxycodone  Pancytopenia:  Secondary to myeloma, chemotherapy.  Anemia component with contributions from blood loss and CKD.  Requiring periodic transfusions of PRBC's for Hgb < 8  Poor venous access:  Port-a-cath placed by Radiology during the hospitalization in October 2023  Right Hemothorax:  Secondary to pathologic rib fractures from myeloma.  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  Cytology from pleural fluid has been negative for plasma cellsUnderwent chest tube placement  Follow up CXR improved.  Hgb more stable and chest pain improved indicating that this problem is resolving  Anemia:  Multifactorial.  Secondary to myeloma, renal insufficiency and bleeding (hemothorax)  Will follow Hgb and transfuse as needed for Hgb < 8 g/dL.  Phenotyping performed in preparation for starting daratumumab.  Hgb appears more stable with treatment of myeloma  Visual disturbances:  Likely due to hyperviscosity from myeloma.  Ophthalmology follow up arranged  Deconditioning:  Arrange for PT by home health.  Patient is unable to leave home except for medical visits.    Social work consult:  To assist with disability paperwork    Cancer Staging  Multiple myeloma [facility]) Staging form: Plasma Cell Myeloma and Plasma Cell Disorders, AJCC 8th Edition - Clinical stage from 07/12/2022: RISS Stage III (Beta-2-microglobulin (mg/L): 17.7, Albumin (g/dL): 2.9, ISS: Stage III, High-risk cytogenetics: Present, LDH: Normal) -  Signed by Barbee Cough, MD on 07/13/2022 Histopathologic type: Multiple myeloma Stage prefix: Initial diagnosis Beta 2 microglobulin range (mg/L): Greater than or equal to 5.5 Albumin range (g/dL): Less than 3.5 Cytogenetics: 17p deletion, t(11;14) translocation Lactate dehydrogenase (LDH) (U/L): 133 Serum calcium level: Elevated Pre-operative calcium level (mg/dL): 13.3 Serum creatinine level: Elevated Creatinine (mg/dL): 3.3 Bone disease on imaging: Present Hemoglobin (Hgb) (g/dL): 6.3 Pretreatment IgG (mg/dL): 215 Pretreatment IgA (mg/dL): 6,400 Pretreatment IgM (mg/dL): 13 Pretreatment monoclonal protein level in serum (M spike) (g/dL): 6 Pretreatment monoclonal protein level in 24-hour urine (M spike) (g): 1 Stage used in treatment planning: Yes National guidelines used in treatment planning: Yes   No problem-specific Assessment & Plan notes found for this encounter.   Orders Placed This Encounter  Procedures   Multiple Myeloma Panel (SPEP&IFE w/QIG)    Standing Status:   Future    Number of Occurrences:   1    Standing Expiration Date:   08/09/2023   Kappa/lambda light chains    Standing Status:   Future    Number of Occurrences:   1    Standing Expiration Date:   08/09/2023   Lactate dehydrogenase   Viscosity, serum    Standing Status:   Future    Number of Occurrences:   1    Standing Expiration Date:   08/08/2023   Beta 2 microglobulin, serum   Informed Consent Details: Physician/Practitioner Attestation; Transcribe to consent form and obtain patient signature    Standing Status:   Future    Standing Expiration Date:   08/09/2023    Order Specific  Question:   Physician/Practitioner attestation of informed consent for blood and or blood product transfusion    Answer:   I, the physician/practitioner, attest that I have discussed with the patient the benefits, risks, side effects, alternatives, likelihood of achieving goals and potential problems during recovery  for the procedure that I have provided informed consent.    Order Specific Question:   Product(s)    Answer:   All Product(s)   Care order/instruction    Transfuse Parameters    Standing Status:   Future    Standing Expiration Date:   08/08/2023   Sample to Blood Bank   Type and screen         Standing Status:   Future    Number of Occurrences:   1    Standing Expiration Date:   08/09/2023     All questions were answered. The patient knows to call the clinic with any problems, questions or concerns.  This note was electronically signed.    Barbee Cough, MD  08/10/2022 1:46 PM

## 2022-08-08 NOTE — Progress Notes (Signed)
CRITICAL VALUE STICKER  CRITICAL VALUE:  WBC 1.0, Ptl 26, Hgb 7.  RECEIVER (on-site recipient of call):  Cheria Sadiq, [place] NOTIFIED:  08/08/2022 @ 1541  MESSENGER (representative from lab):  Alexis from [facility]  MD NOTIFIED:   Dr. Federico Flake  TIME OF NOTIFICATION:  1550  RESPONSE:  Transfuse 2 units PRBC

## 2022-08-08 NOTE — Progress Notes (Signed)
CRITICAL VALUE STICKER  CRITICAL VALUE:  Cre 3.13, BUN 34  RECEIVER (on-site recipient of call):  Adonis Huguenin, RN   08/08/2022 @ 1615  MESSENGER (representative from lab):  [facility]  MD NOTIFIED:   Dr. Federico Flake  TIME OF NOTIFICATION:   1620  RESPONSE:   No change in levels.

## 2022-08-08 NOTE — Telephone Encounter (Signed)
Informed patient of transfusion appt on 08/10/2022 @ 0830 of 2units PRBC.

## 2022-08-09 ENCOUNTER — Other Ambulatory Visit: Payer: Self-pay | Admitting: Pharmacist

## 2022-08-09 DIAGNOSIS — D630 Anemia in neoplastic disease: Secondary | ICD-10-CM

## 2022-08-09 LAB — BETA 2 MICROGLOBULIN, SERUM: Beta-2 Microglobulin: 36.6 mg/L — ABNORMAL HIGH (ref 0.6–2.4)

## 2022-08-09 LAB — PREPARE RBC (CROSSMATCH)

## 2022-08-09 LAB — KAPPA/LAMBDA LIGHT CHAINS
Kappa free light chain: 5957.8 mg/L — ABNORMAL HIGH (ref 3.3–19.4)
Lambda free light chains: <1.5 mg/L — ABNORMAL LOW (ref 5.7–26.3)

## 2022-08-09 MED FILL — Daratumumab-Hyaluronidase-fihj Inj 1800-30000 MG-Unit/15ML: SUBCUTANEOUS | Qty: 15 | Status: AC

## 2022-08-09 MED FILL — Bortezomib For Inj 3.5 MG: INTRAMUSCULAR | Qty: 1 | Status: AC

## 2022-08-10 ENCOUNTER — Inpatient Hospital Stay: Payer: 59

## 2022-08-10 ENCOUNTER — Encounter: Payer: Self-pay | Admitting: Oncology

## 2022-08-10 ENCOUNTER — Ambulatory Visit: Payer: 59

## 2022-08-10 VITALS — BP 132/61 | HR 95 | Temp 98.9°F | Resp 16

## 2022-08-10 DIAGNOSIS — C9000 Multiple myeloma not having achieved remission: Secondary | ICD-10-CM

## 2022-08-10 DIAGNOSIS — D630 Anemia in neoplastic disease: Secondary | ICD-10-CM

## 2022-08-10 DIAGNOSIS — Z5112 Encounter for antineoplastic immunotherapy: Secondary | ICD-10-CM | POA: Diagnosis not present

## 2022-08-10 MED ORDER — DARATUMUMAB-HYALURONIDASE-FIHJ 1800-30000 MG-UT/15ML ~~LOC~~ SOLN
1800.0000 mg | Freq: Once | SUBCUTANEOUS | Status: AC
  Administered 2022-08-10: 1800 mg via SUBCUTANEOUS
  Filled 2022-08-10: qty 15

## 2022-08-10 MED ORDER — BORTEZOMIB CHEMO SQ INJECTION 3.5 MG (2.5MG/ML)
1.3000 mg/m2 | Freq: Once | INTRAMUSCULAR | Status: AC
  Administered 2022-08-10: 2.5 mg via SUBCUTANEOUS
  Filled 2022-08-10: qty 1

## 2022-08-10 MED ORDER — SODIUM CHLORIDE 0.9% IV SOLUTION
250.0000 mL | Freq: Once | INTRAVENOUS | Status: AC
  Administered 2022-08-10: 250 mL via INTRAVENOUS

## 2022-08-10 MED ORDER — HEPARIN SOD (PORK) LOCK FLUSH 100 UNIT/ML IV SOLN
500.0000 [IU] | Freq: Every day | INTRAVENOUS | Status: AC | PRN
  Administered 2022-08-10: 500 [IU]

## 2022-08-10 MED ORDER — ACETAMINOPHEN 325 MG PO TABS
650.0000 mg | ORAL_TABLET | Freq: Once | ORAL | Status: AC
  Administered 2022-08-10: 650 mg via ORAL
  Filled 2022-08-10: qty 2

## 2022-08-10 MED ORDER — SODIUM CHLORIDE 0.9% FLUSH
10.0000 mL | INTRAVENOUS | Status: AC | PRN
  Administered 2022-08-10: 10 mL

## 2022-08-10 MED ORDER — DEXAMETHASONE 4 MG PO TABS
20.0000 mg | ORAL_TABLET | Freq: Once | ORAL | Status: AC
  Administered 2022-08-10: 20 mg via ORAL
  Filled 2022-08-10: qty 5

## 2022-08-10 MED ORDER — DIPHENHYDRAMINE HCL 25 MG PO CAPS
25.0000 mg | ORAL_CAPSULE | Freq: Once | ORAL | Status: AC
  Administered 2022-08-10: 25 mg via ORAL
  Filled 2022-08-10: qty 1

## 2022-08-10 MED ORDER — DIPHENHYDRAMINE HCL 25 MG PO CAPS
50.0000 mg | ORAL_CAPSULE | Freq: Once | ORAL | Status: AC
  Administered 2022-08-10: 25 mg via ORAL
  Filled 2022-08-10: qty 2

## 2022-08-10 NOTE — Patient Instructions (Addendum)
Blood Transfusion, Adult, Care After After a blood transfusion, it is common to have: Bruising and soreness at the IV site. A headache. Follow these instructions at home: Your doctor may give you more instructions. If you have problems, contact your doctor. Insertion site care     Follow instructions from your doctor about how to take care of your insertion site. This is where an IV tube was put into your vein. Make sure you: Wash your hands with soap and water for at least 20 seconds before and after you change your bandage. If you cannot use soap and water, use hand sanitizer. Change your bandage as told by your doctor. Check your insertion site every day for signs of infection. Check for: Redness, swelling, or pain. Bleeding from the site. Warmth. Pus or a bad smell. General instructions Take over-the-counter and prescription medicines only as told by your doctor. Rest as told by your doctor. Go back to your normal activities as told by your doctor. Keep all follow-up visits. You may need to have tests at certain times to check your blood. Contact a doctor if: You have itching or red, swollen areas of skin (hives). You have a fever or chills. You have pain in the head, back, or chest. You feel worried or nervous (anxious). You feel weak after doing your normal activities. You have any of these problems at the insertion site: Redness, swelling, warmth, or pain. Bleeding that does not stop with pressure. Pus or a bad smell. If you received your blood transfusion in an outpatient setting, you will be told whom to contact to report any reactions. Get help right away if: You have signs of a serious reaction. This may be coming from an allergy or the body's defense system (immune system). Signs include: Trouble breathing or shortness of breath. Swelling of the face or feeling warm (flushed). A widespread rash. Dark pee (urine) or blood in the pee. Fast heartbeat. These symptoms  may be an emergency. Get help right away. Call 911. Do not wait to see if the symptoms will go away. Do not drive yourself to the hospital. Summary Bruising and soreness at the IV site are common. Check your insertion site every day for signs of infection. Rest as told by your doctor. Go back to your normal activities as told by your doctor. Get help right away if you have signs of a serious reaction. This information is not intended to replace advice given to you by your health care provider. Make sure you discuss any questions you have with your health care provider. Document Revised: 11/18/2021 Document Reviewed: 11/18/2021 Elsevier Patient Education  [place] Injection What is this medication? DARATUMUMAB (dar a toom ue mab) treats multiple myeloma, a type of bone marrow cancer. It works by helping your immune system slow or stop the spread of cancer cells. It is a monoclonal antibody. This medicine may be used for other purposes; ask your health care provider or pharmacist if you have questions. COMMON BRAND NAME(S): DARZALEX What should I tell my care team before I take this medication? They need to know if you have any of these conditions: Hereditary fructose intolerance Infection, such as chickenpox, herpes, hepatitis B virus Lung or breathing disease, such as asthma, COPD An unusual or allergic reaction to daratumumab, sorbitol, other medications, foods, dyes, or preservatives Pregnant or trying to get pregnant Breast-feeding How should I use this medication? This medication is injected into a vein. It is given by your  care team in a hospital or clinic setting. Talk to your care team about the use of this medication in children. Special care may be needed. Overdosage: If you think you have taken too much of this medicine contact a poison control center or emergency room at once. NOTE: This medicine is only for you. Do not share this medicine with  others. What if I miss a dose? Keep appointments for follow-up doses. It is important not to miss your dose. Call your care team if you are unable to keep an appointment. What may interact with this medication? Interactions have not been studied. This list may not describe all possible interactions. Give your health care provider a list of all the medicines, herbs, non-prescription drugs, or dietary supplements you use. Also tell them if you smoke, drink alcohol, or use illegal drugs. Some items may interact with your medicine. What should I watch for while using this medication? Your condition will be monitored carefully while you are receiving this medication. This medication can cause serious allergic reactions. To reduce your risk, your care team may give you other medication to take before receiving this one. Be sure to follow the directions from your care team. This medication can affect the results of blood tests to match your blood type. These changes can last for up to 6 months after the final dose. Your care team will do blood tests to match your blood type before you start treatment. Tell all of your care team that you are being treated with this medication before receiving a blood transfusion. This medication can affect the results of some tests used to determine treatment response; extra tests may be needed to evaluate response. Talk to your care team if you wish to become pregnant or think you are pregnant. This medication can cause serious birth defects if taken during pregnancy and for 3 months after the last dose. A reliable form of contraception is recommended while taking this medication and for 3 months after the last dose. Talk to your care team about effective forms of contraception. Do not breast-feed while taking this medication. What side effects may I notice from receiving this medication? Side effects that you should report to your care team as soon as possible: Allergic  reactions--skin rash, itching, hives, swelling of the face, lips, tongue, or throat Infection--fever, chills, cough, sore throat, wounds that don't heal, pain or trouble when passing urine, general feeling of discomfort or being unwell Infusion reactions--chest pain, shortness of breath or trouble breathing, feeling faint or lightheaded Unusual bruising or bleeding Side effects that usually do not require medical attention (report to your care team if they continue or are bothersome): Constipation Diarrhea Fatigue Nausea Pain, tingling, or numbness in the hands or feet Swelling of the ankles, hands, or feet This list may not describe all possible side effects. Call your doctor for medical advice about side effects. You may report side effects to FDA at 1-800-FDA-1088. Where should I keep my medication? This medication is given in a hospital or clinic. It will not be stored at home. NOTE: This sheet is a summary. It may not cover all possible information. If you have questions about this medicine, talk to your doctor, pharmacist, or health care provider.  2023 Elsevier/Gold Standard (2021-12-14 00:00:00)

## 2022-08-10 NOTE — Progress Notes (Signed)
Premed dose- Benadryl 50 reduced to '25mg'$  per Ulice Dash Pharm D.

## 2022-08-10 NOTE — Progress Notes (Signed)
Patient remains at infusion center for first encounter- today- See vital signs and assessment with earlier encounter that is still occurring

## 2022-08-10 NOTE — Progress Notes (Signed)
DISCONTINUE ON PATHWAY REGIMEN - Multiple Myeloma and Other Plasma Cell Dyscrasias   DaraVRd (Daratumumab IV + Bortezomib SUBQ + Lenalidomide PO + Dexamethasone IV/PO) q21 Days (Induction Schema):   A cycle is every 21 days:     Lenalidomide      Dexamethasone      Bortezomib      Daratumumab    DaraVRd (Daratumumab IV + Bortezomib SUBQ + Lenalidomide PO + Dexamethasone IV/PO) q21 Days (Consolidation Schema):   A cycle is every 21 days:     Lenalidomide      Dexamethasone      Bortezomib      Daratumumab   **Always confirm dose/schedule in your pharmacy ordering system**  REASON: Disease Progression PRIOR TREATMENT: [id number]: DaraVRd (Daratumumab 16 mg/kg IV + Bortezomib 1.3 mg/m2 SUBQ D1, 4, 8, 11 + Lenalidomide 25 mg PO + Dexamethasone 20 mg PO/IV) q21 Days Concurrent with Referral to Transplant Service TREATMENT RESPONSE: Progressive Disease (PD)  START ON PATHWAY REGIMEN - Multiple Myeloma and Other Plasma Cell Dyscrasias     Cycles 1 and 2: A cycle is every 28 days:     Pomalidomide      Dexamethasone      Dexamethasone      Elotuzumab    Cycles 3 and beyond: A cycle is every 28 days:     Pomalidomide      Dexamethasone      Dexamethasone      Elotuzumab      Dexamethasone   **Always confirm dose/schedule in your pharmacy ordering system**  Patient Characteristics: Multiple Myeloma, Relapsed / Refractory, Second through Fourth Lines of Therapy, Fit or Candidate for Triplet Therapy, Lenalidomide-Refractory or Lenalidomide-based Regimen Not Preferred, Not a Candidate for Anti-CD38 Antibody Disease Classification: Multiple Myeloma R-ISS Staging: III Therapeutic Status: Refractory Line of Therapy: Third Line Anti-CD38 Antibody Candidacy: Not a Candidate for Anti-CD38 Antibody Lenalidomide-based Regimen Preference/Candidacy: Lenalidomide-Refractory Intent of Therapy: Non-Curative / Palliative Intent, Discussed with Patient

## 2022-08-11 ENCOUNTER — Other Ambulatory Visit (HOSPITAL_COMMUNITY): Payer: Self-pay

## 2022-08-11 ENCOUNTER — Telehealth: Payer: Self-pay

## 2022-08-11 LAB — TYPE AND SCREEN
ABO/RH(D): O POS
Antibody Screen: POSITIVE
Donor AG Type: NEGATIVE
Donor AG Type: NEGATIVE
Unit division: 0
Unit division: 0

## 2022-08-11 LAB — BPAM RBC
Blood Product Expiration Date: 202401092359
Blood Product Expiration Date: 202401112359
ISSUE DATE / TIME: 202312070725
ISSUE DATE / TIME: 202312070725
Unit Type and Rh: 5100
Unit Type and Rh: 5100

## 2022-08-11 MED ORDER — POMALIDOMIDE 4 MG PO CAPS: 4.0000 mg | ORAL_CAPSULE | Freq: Every day | ORAL | 0 refills | Status: DC

## 2022-08-11 NOTE — Telephone Encounter (Addendum)
Oral Oncology Patient Advocate Encounter  Prior Authorization for Pomalyst has been approved.     Effective dates: 12.08.23 through 12.08.24    Kimberly Esparza, [place] Patient [place]  [phone] (phone) [phone] (fax) 08/11/2022 2:39 PM

## 2022-08-11 NOTE — Addendum Note (Signed)
Addended by: Wanita Chamberlain on: 08/11/2022 12:45 PM   Modules accepted: Orders

## 2022-08-11 NOTE — Telephone Encounter (Signed)
Oral Oncology Patient Advocate Encounter   Received notification that prior authorization for Pomalyst is required.   PA submitted on 08/11/22  Status is pending     Kimberly Esparza, Kimberly Esparza  [phone] (phone) [phone] (fax) 08/11/2022 1:52 PM

## 2022-08-11 NOTE — Telephone Encounter (Signed)
Oral Oncology Pharmacist Encounter  Received new prescription for pomalidomide (Pomalyst) for the treatment of multiple myeloma in conjunction with elotuzumab and dexamethasone, planned duration until disease progression or unacceptable toxicity.  Labs from 08/08/22 (CBC, CMP) and multiple myeloma labs assessed, no interventions needed. Prescription dose and frequency assessed, no dose reduction needed for renal impairment with Pomalyst.   Current medication list in Epic reviewed, DDIs with Pomalyst identified: - oxycodone and olanzapine (CNS depressants): may enhance the CNS effects/ adverse reactions of additional CNS depressants. Will need to closely monitor patient for CNS function/ effects.   Evaluated chart and no patient barriers to medication adherence noted.   Patient agreement for treatment documented in MD note on 08/08/2022.  Prescription has been e-scribed to the [facility] for benefits analysis and approval.  Oral Oncology Clinic will continue to follow for insurance authorization, copayment issues, initial counseling and start date.  Drema Halon, PharmD Hematology/Oncology Clinical Pharmacist [facility] [phone] 08/11/2022 1:27 PM

## 2022-08-13 ENCOUNTER — Other Ambulatory Visit: Payer: Self-pay | Admitting: Oncology

## 2022-08-13 DIAGNOSIS — C9000 Multiple myeloma not having achieved remission: Secondary | ICD-10-CM

## 2022-08-13 MED ORDER — ONDANSETRON 4 MG PO TBDP: 4.0000 mg | ORAL_TABLET | Freq: Three times a day (TID) | ORAL | 5 refills | Status: DC | PRN

## 2022-08-13 MED ORDER — DEXAMETHASONE 4 MG PO TABS: 4.0000 mg | ORAL_TABLET | Freq: Two times a day (BID) | ORAL | 5 refills | Status: DC

## 2022-08-13 MED ORDER — PROMETHAZINE HCL 25 MG PO TABS: 25.0000 mg | ORAL_TABLET | Freq: Four times a day (QID) | ORAL | 0 refills | Status: DC | PRN

## 2022-08-14 ENCOUNTER — Other Ambulatory Visit: Payer: Self-pay | Admitting: Pharmacist

## 2022-08-14 ENCOUNTER — Ambulatory Visit: Payer: 59

## 2022-08-14 LAB — VISCOSITY, SERUM: Viscosity, Serum: 2.4 rel.saline — ABNORMAL HIGH (ref 1.4–2.1)

## 2022-08-14 NOTE — Telephone Encounter (Signed)
Oral Oncology Pharmacist Encounter  Patient will no longer be receiving Pomalyst as she will be admitted to [facility] for treatment.   Oral oncology clinic will sign off.  Drema Halon, PharmD Hematology/Oncology Clinical Pharmacist [facility] [phone]

## 2022-08-15 ENCOUNTER — Ambulatory Visit: Payer: 59 | Admitting: Oncology

## 2022-08-15 ENCOUNTER — Ambulatory Visit: Payer: 59

## 2022-08-15 ENCOUNTER — Other Ambulatory Visit: Payer: 59

## 2022-08-15 LAB — MULTIPLE MYELOMA PANEL, SERUM
Albumin SerPl Elph-Mcnc: 2.9 g/dL (ref 2.9–4.4)
Albumin/Glob SerPl: 0.4 — ABNORMAL LOW (ref 0.7–1.7)
Alpha 1: 0.3 g/dL (ref 0.0–0.4)
Alpha2 Glob SerPl Elph-Mcnc: 0.4 g/dL (ref 0.4–1.0)
B-Globulin SerPl Elph-Mcnc: 7.9 g/dL — ABNORMAL HIGH (ref 0.7–1.3)
Gamma Glob SerPl Elph-Mcnc: 0.2 g/dL — ABNORMAL LOW (ref 0.4–1.8)
Globulin, Total: 8.8 g/dL — ABNORMAL HIGH (ref 2.2–3.9)
IgA: >6400 mg/dL — ABNORMAL HIGH (ref 87–352)
IgG (Immunoglobin G), Serum: 174 mg/dL — ABNORMAL LOW (ref 586–1602)
IgM (Immunoglobulin M), Srm: 10 mg/dL — ABNORMAL LOW (ref 26–217)
M Protein SerPl Elph-Mcnc: 7 g/dL — ABNORMAL HIGH
Total Protein ELP: 11.7 g/dL — ABNORMAL HIGH (ref 6.0–8.5)

## 2022-08-16 ENCOUNTER — Inpatient Hospital Stay: Payer: 59

## 2022-08-16 ENCOUNTER — Inpatient Hospital Stay: Payer: 59 | Admitting: Oncology

## 2022-08-16 ENCOUNTER — Encounter: Payer: Self-pay | Admitting: Oncology

## 2022-08-17 ENCOUNTER — Ambulatory Visit: Payer: 59

## 2022-08-18 ENCOUNTER — Ambulatory Visit: Payer: 59

## 2022-08-19 DIAGNOSIS — E871 Hypo-osmolality and hyponatremia: Secondary | ICD-10-CM | POA: Insufficient documentation

## 2022-08-20 DIAGNOSIS — E440 Moderate protein-calorie malnutrition: Secondary | ICD-10-CM | POA: Insufficient documentation

## 2022-08-20 DIAGNOSIS — E872 Acidosis, unspecified: Secondary | ICD-10-CM | POA: Insufficient documentation

## 2022-08-20 DIAGNOSIS — E790 Hyperuricemia without signs of inflammatory arthritis and tophaceous disease: Secondary | ICD-10-CM | POA: Insufficient documentation

## 2022-08-20 DIAGNOSIS — J111 Influenza due to unidentified influenza virus with other respiratory manifestations: Secondary | ICD-10-CM | POA: Insufficient documentation

## 2022-08-20 DIAGNOSIS — E66812 Obesity, class 2: Secondary | ICD-10-CM | POA: Insufficient documentation

## 2022-08-21 ENCOUNTER — Ambulatory Visit: Payer: 59

## 2022-08-22 ENCOUNTER — Ambulatory Visit: Payer: 59 | Admitting: Oncology

## 2022-08-22 ENCOUNTER — Other Ambulatory Visit: Payer: 59

## 2022-08-22 ENCOUNTER — Ambulatory Visit: Payer: 59

## 2022-08-23 ENCOUNTER — Ambulatory Visit: Payer: 59 | Admitting: Oncology

## 2022-08-23 ENCOUNTER — Other Ambulatory Visit: Payer: 59

## 2022-08-24 ENCOUNTER — Ambulatory Visit: Payer: 59

## 2022-08-24 ENCOUNTER — Other Ambulatory Visit: Payer: Self-pay | Admitting: Oncology

## 2022-08-24 DIAGNOSIS — C9000 Multiple myeloma not having achieved remission: Secondary | ICD-10-CM

## 2022-08-25 ENCOUNTER — Ambulatory Visit: Payer: 59

## 2022-09-11 ENCOUNTER — Encounter: Payer: Self-pay | Admitting: Oncology

## 2022-09-20 DIAGNOSIS — J189 Pneumonia, unspecified organism: Secondary | ICD-10-CM | POA: Insufficient documentation

## 2022-09-20 DIAGNOSIS — E875 Hyperkalemia: Secondary | ICD-10-CM | POA: Insufficient documentation

## 2022-09-21 NOTE — Progress Notes (Signed)
error 

## 2022-09-27 DIAGNOSIS — R112 Nausea with vomiting, unspecified: Secondary | ICD-10-CM | POA: Insufficient documentation

## 2022-10-05 DIAGNOSIS — T7840XA Allergy, unspecified, initial encounter: Secondary | ICD-10-CM | POA: Insufficient documentation

## 2022-10-09 DIAGNOSIS — D801 Nonfamilial hypogammaglobulinemia: Secondary | ICD-10-CM | POA: Insufficient documentation

## 2022-10-26 ENCOUNTER — Encounter: Payer: Self-pay | Admitting: Oncology

## 2022-11-17 ENCOUNTER — Other Ambulatory Visit: Payer: Self-pay | Admitting: Nurse Practitioner

## 2022-11-17 DIAGNOSIS — Z1231 Encounter for screening mammogram for malignant neoplasm of breast: Secondary | ICD-10-CM

## 2022-11-19 ENCOUNTER — Encounter: Payer: Self-pay | Admitting: Hematology and Oncology

## 2022-11-20 ENCOUNTER — Encounter: Payer: Self-pay | Admitting: Oncology

## 2023-01-03 ENCOUNTER — Ambulatory Visit: Payer: 59

## 2023-01-05 DIAGNOSIS — E860 Dehydration: Secondary | ICD-10-CM | POA: Insufficient documentation

## 2023-02-13 ENCOUNTER — Other Ambulatory Visit (HOSPITAL_COMMUNITY): Payer: Self-pay | Admitting: Internal Medicine

## 2023-02-13 DIAGNOSIS — C9000 Multiple myeloma not having achieved remission: Secondary | ICD-10-CM

## 2023-02-19 ENCOUNTER — Encounter: Payer: Self-pay | Admitting: Oncology

## 2023-02-26 ENCOUNTER — Encounter: Payer: Self-pay | Admitting: Oncology

## 2023-02-27 ENCOUNTER — Encounter (HOSPITAL_COMMUNITY)
Admission: RE | Admit: 2023-02-27 | Discharge: 2023-02-27 | Disposition: A | Discharge status: Home / Self Care | Payer: 59 | Source: Ambulatory Visit | Attending: Internal Medicine | Admitting: Internal Medicine

## 2023-02-27 DIAGNOSIS — C9000 Multiple myeloma not having achieved remission: Secondary | ICD-10-CM | POA: Diagnosis present

## 2023-02-27 LAB — GLUCOSE, CAPILLARY: Glucose-Capillary: 100 mg/dL — ABNORMAL HIGH (ref 70–99)

## 2023-02-27 MED ORDER — FLUDEOXYGLUCOSE F - 18 (FDG) INJECTION
8.0000 | Freq: Once | INTRAVENOUS | Status: AC | PRN
  Administered 2023-02-27: 8 via INTRAVENOUS

## 2023-03-22 ENCOUNTER — Encounter: Payer: Self-pay | Admitting: Behavioral Health

## 2023-03-22 ENCOUNTER — Ambulatory Visit (INDEPENDENT_AMBULATORY_CARE_PROVIDER_SITE_OTHER): Payer: 59 | Admitting: Behavioral Health

## 2023-03-22 ENCOUNTER — Encounter: Payer: Self-pay | Admitting: Oncology

## 2023-03-22 DIAGNOSIS — R454 Irritability and anger: Secondary | ICD-10-CM | POA: Diagnosis not present

## 2023-03-22 DIAGNOSIS — F331 Major depressive disorder, recurrent, moderate: Secondary | ICD-10-CM | POA: Diagnosis not present

## 2023-03-22 DIAGNOSIS — F411 Generalized anxiety disorder: Secondary | ICD-10-CM | POA: Diagnosis not present

## 2023-03-22 MED ORDER — DULOXETINE HCL 20 MG PO CPEP: 20.0000 mg | ORAL_CAPSULE | Freq: Two times a day (BID) | ORAL | 3 refills | Status: DC

## 2023-03-22 NOTE — Progress Notes (Signed)
[facility] Med Check  Patient ID: Kimberly Esparza,  MRN: [id number]  PCP: Jim Like, NP  Date of Evaluation: 03/22/2023 Time spent:40 minutes  Chief Complaint:  Chief Complaint   Anxiety; Depression; Follow-up; Medication Problem; Patient Education; Stress     HISTORY/CURRENT STATUS: HPI  Kimberly Esparza presents for follow-up and medication management. Says that the reason since she has not followed up with me since 2022 is due to the fact that she was diagnosed with multiple myeloma. She has been undergoing extensive chemo and various treatments. Says she feels better right now but is worried about her prognosis.  She is here today to receive help for anger reaction and irritability. Says she get upset at little things and also gets road rage. She understands that she is on many medications and caution needs to be used with medications. She would like to try Cymbalta to see if it helps and maybe get some relief from body pain as well.  She says that her anxiety today is 5/10 and depression is 5/10.  She is sleeping 7-8 hours per night with aid of mirtazapine. She is sleeping 7-8 hours at night. No mania, no psychosis, No SI/HI.    Past Psychiatric Medication Failures as reported by PT   Abilify Lamictal Zoloft Paxil Lexapro Seroquel Celexa Effexor Wellbutrin Adderall  Cymbalta        Individual Medical History/ Review of Systems: Changes? :No   Allergies: Doxycycline and Tape  Current Medications:  Current Outpatient Medications:    DULoxetine (CYMBALTA) 20 MG capsule, Take 1 capsule (20 mg total) by mouth 2 (two) times daily., Disp: 60 capsule, Rfl: 3   promethazine (PHENERGAN) 25 MG tablet, Take 1 tablet (25 mg total) by mouth every 6 (six) hours as needed for nausea or vomiting., Disp: 30 tablet, Rfl: 0   acyclovir (ZOVIRAX) 400 MG tablet, Take 1 tablet (400 mg total) by mouth 2 (two) times daily., Disp: 60 tablet, Rfl: 3   amoxicillin (AMOXIL) 500 MG  tablet, , Disp: , Rfl:    ASPIRIN 81 PO, Take 81 mg by mouth daily., Disp: , Rfl:    azelastine (ASTELIN) 0.1 % nasal spray, Place 2 sprays into both nostrils 2 (two) times daily as needed for rhinitis. Use in each nostril as directed, Disp: 30 mL, Rfl: 3   DEXILANT 60 MG capsule, Take 1 capsule by mouth daily., Disp: , Rfl: 0   fluticasone (FLONASE) 50 MCG/ACT nasal spray, Place 1 spray into the nose daily., Disp: , Rfl:    levothyroxine (SYNTHROID) 88 MCG tablet, Take 88 mcg by mouth daily., Disp: , Rfl: 3   montelukast (SINGULAIR) 10 MG tablet, Take 1 tablet by mouth daily., Disp: , Rfl:    OLANZapine (ZYPREXA) 5 MG tablet, Take 1 tablet (5 mg total) by mouth at bedtime., Disp: 30 tablet, Rfl: 3   ondansetron (ZOFRAN-ODT) 4 MG disintegrating tablet, Take 1 tablet (4 mg total) by mouth every 8 (eight) hours as needed., Disp: 30 tablet, Rfl: 5   oxyCODONE (OXY IR/ROXICODONE) 5 MG immediate release tablet, Take by mouth. Take 1 tablet (5 mg total) by mouth every 4 (four) hours as needed for Moderate pain (4-6) or Severe pain (7-10)., Disp: , Rfl:    potassium chloride SA (KLOR-CON M) 20 MEQ tablet, Take 1 tablet (20 mEq total) by mouth daily., Disp: 30 tablet, Rfl: 1   Wheat Dextrin (BENEFIBER PO), Take by mouth., Disp: , Rfl:   Current Facility-Administered Medications:  acetaminophen (TYLENOL) tablet 650 mg, 650 mg, Oral, Once, Mosher, Harvin Hazel A, PA-C Medication Side Effects: none  Family Medical/ Social History: Changes? No  MENTAL HEALTH EXAM:  There were no vitals taken for this visit.There is no height or weight on file to calculate BMI.  General Appearance: Casual, Neat, and Well Groomed  Eye Contact:  Good  Speech:  Clear and Coherent  Volume:  Normal  Mood:  Anxious, Depressed, and Dysphoric  Affect:  Congruent, Depressed, and Anxious  Thought Process:  Coherent  Orientation:  Full (Time, Place, and Person)  Thought Content: Logical   Suicidal Thoughts:  No  Homicidal  Thoughts:  No  Memory:  WNL  Judgement:  Good  Insight:  Good  Psychomotor Activity:  Normal  Concentration:  Concentration: Good  Recall:  Good  Fund of Knowledge: Good  Language: Good  Assets:  Desire for Improvement  ADL's:  Intact  Cognition: WNL  Prognosis:  Good    DIAGNOSES:    ICD-10-CM   1. Major depressive disorder, recurrent episode, moderate (HCC)  F33.1 DULoxetine (CYMBALTA) 20 MG capsule    2. Generalized anxiety disorder  F41.1 DULoxetine (CYMBALTA) 20 MG capsule    3. Anger reaction  R45.4 DULoxetine (CYMBALTA) 20 MG capsule      Receiving Psychotherapy: No    RECOMMENDATIONS:   Greater than 50% of 40 min face to face time with patient was spent on counseling and coordination of care. We discussed her dx of Multiple Myeloma and medications. She wants to be cautious in prescribing new meds. We talked about options and I also requesting she speak with her CX physician as well as pharmacist.  Will start Cymbalta 20 mg twice daily To continue mirtazapine 15 mg  at bedtime daily Will follow-up in 6 weeks to reassess Provided emergency contact information PDMP    Joan Flores, NP

## 2023-04-02 DIAGNOSIS — J3489 Other specified disorders of nose and nasal sinuses: Secondary | ICD-10-CM | POA: Insufficient documentation

## 2023-05-03 ENCOUNTER — Telehealth (INDEPENDENT_AMBULATORY_CARE_PROVIDER_SITE_OTHER): Payer: 59 | Admitting: Behavioral Health

## 2023-05-03 ENCOUNTER — Encounter: Payer: Self-pay | Admitting: Behavioral Health

## 2023-05-03 DIAGNOSIS — F331 Major depressive disorder, recurrent, moderate: Secondary | ICD-10-CM | POA: Diagnosis not present

## 2023-05-03 DIAGNOSIS — R454 Irritability and anger: Secondary | ICD-10-CM | POA: Diagnosis not present

## 2023-05-03 DIAGNOSIS — F411 Generalized anxiety disorder: Secondary | ICD-10-CM | POA: Diagnosis not present

## 2023-05-03 MED ORDER — DULOXETINE HCL 20 MG PO CPEP
20.0000 mg | ORAL_CAPSULE | Freq: Three times a day (TID) | ORAL | 3 refills | Status: DC
Start: 2023-05-03 — End: 2023-12-05

## 2023-05-03 NOTE — Progress Notes (Signed)
Kimberly Esparza [id number] 25-May-1966 57 y.o.  Virtual Visit via Video Note  I connected with pt @ on 05/03/23 at 10:30 AM EDT by a video enabled telemedicine application and verified that I am speaking with the correct person using two identifiers.   I discussed the limitations of evaluation and management by telemedicine and the availability of in person appointments. The patient expressed understanding and agreed to proceed.  I discussed the assessment and treatment plan with the patient. The patient was provided an opportunity to ask questions and all were answered. The patient agreed with the plan and demonstrated an understanding of the instructions.   The patient was advised to call back or seek an in-person evaluation if the symptoms worsen or if the condition fails to improve as anticipated.  I provided 30 minutes of non-face-to-face time during this encounter.  The patient was located at home.  The provider was located at [facility].   Kimberly Flores, NP   Subjective:   Patient ID:  Kimberly Esparza is a 57 y.o. (DOB 1966-05-04) female.  Chief Complaint:  Chief Complaint  Patient presents with   Anxiety   Depression   Follow-up   Patient Education   Medication Refill    HPI  Kimberly Esparza presents for follow-up and medication management.  She continues with chemo and treatment for multiple myeloma.  She has lost of her hair. She says that she is "holding her own" recently. Has periods of depression. She is requesting dosage increase of her Cymbalta.  She understands that she is on many medications and caution needs to be used with psychiatric medication. She says that her anxiety today is 4/10 and depression is 4/10.  She is sleeping 7-8 hours per night with aid of mirtazapine. She is sleeping 7-8 hours at night. No mania, no psychosis, No SI/HI.    Past Psychiatric Medication Failures as reported by PT    Abilify Lamictal Zoloft Paxil Lexapro Seroquel Celexa Effexor Wellbutrin Adderall  Cymbalta    Review of Systems:  Review of Systems  Constitutional: Negative.   Allergic/Immunologic: Negative.   Psychiatric/Behavioral:  Positive for dysphoric mood. The patient is nervous/anxious.     Medications: I have reviewed the patient's current medications.  Current Outpatient Medications  Medication Sig Dispense Refill   promethazine (PHENERGAN) 25 MG tablet Take 1 tablet (25 mg total) by mouth every 6 (six) hours as needed for nausea or vomiting. 30 tablet 0   acyclovir (ZOVIRAX) 400 MG tablet Take 1 tablet (400 mg total) by mouth 2 (two) times daily. 60 tablet 3   amoxicillin (AMOXIL) 500 MG tablet      ASPIRIN 81 PO Take 81 mg by mouth daily.     azelastine (ASTELIN) 0.1 % nasal spray Place 2 sprays into both nostrils 2 (two) times daily as needed for rhinitis. Use in each nostril as directed 30 mL 3   DEXILANT 60 MG capsule Take 1 capsule by mouth daily.  0   DULoxetine (CYMBALTA) 20 MG capsule Take 1 capsule (20 mg total) by mouth 3 (three) times daily. 90 capsule 3   fluticasone (FLONASE) 50 MCG/ACT nasal spray Place 1 spray into the nose daily.     levothyroxine (SYNTHROID) 88 MCG tablet Take 88 mcg by mouth daily.  3   montelukast (SINGULAIR) 10 MG tablet Take 1 tablet by mouth daily.     OLANZapine (ZYPREXA) 5 MG tablet Take 1 tablet (5 mg total) by mouth at bedtime. 30  tablet 3   ondansetron (ZOFRAN-ODT) 4 MG disintegrating tablet Take 1 tablet (4 mg total) by mouth every 8 (eight) hours as needed. 30 tablet 5   oxyCODONE (OXY IR/ROXICODONE) 5 MG immediate release tablet Take by mouth. Take 1 tablet (5 mg total) by mouth every 4 (four) hours as needed for Moderate pain (4-6) or Severe pain (7-10).     potassium chloride SA (KLOR-CON M) 20 MEQ tablet Take 1 tablet (20 mEq total) by mouth daily. 30 tablet 1   Wheat Dextrin (BENEFIBER PO) Take by mouth.     Current  Facility-Administered Medications  Medication Dose Route Frequency Provider Last Rate Last Admin   acetaminophen (TYLENOL) tablet 650 mg  650 mg Oral Once Belva Crome A, PA-C        Medication Side Effects: None  Allergies:  Allergies  Allergen Reactions   Doxycycline Other (See Comments)    Other reaction(s): GI Upset (intolerance)   Tape Hives    Skin Irritation    Past Medical History:  Diagnosis Date   ADHD (attention deficit hyperactivity disorder)    Anxiety    Depression    GERD (gastroesophageal reflux disease)    Hypothyroidism    Multiple allergies    Osteoarthritis     Family History  Problem Relation Age of Onset   Hypertension Mother    Diabetes Mother    Glaucoma Mother    Gout Mother    Congestive Heart Failure Mother    Allergic rhinitis Father    Thyroid disease Father    Allergic rhinitis Brother    Gout Brother     Social History   Socioeconomic History   Marital status: Married    Spouse name: Kimberly Esparza   Number of children: 2   Years of education: 14   Highest education level: Associate degree: academic program  Occupational History   Not on file  Tobacco Use   Smoking status: Never   Smokeless tobacco: Never  Vaping Use   Vaping status: Never Used  Substance and Sexual Activity   Alcohol use: Yes    Comment: rarely   Drug use: Never   Sexual activity: Not Currently    Partners: Male    Comment: Married but no interest  Other Topics Concern   Not on file  Social History Narrative   Not on file   Social Determinants of Health   Financial Resource Strain: Low Risk  (09/11/2022)   Received from [facility], [facility]   Overall Financial Resource Strain (CARDIA)    Difficulty of Paying Living Expenses: Not hard at all  Food Insecurity: No Food Insecurity (09/11/2022)   Received from [facility], [facility]   Hunger Vital Sign    Worried About Running Out of Food in the Last Year: Never true    Ran Out of Food in  the Last Year: Never true  Transportation Needs: No Transportation Needs (09/11/2022)   Received from [facility], [facility]   PRAPARE - Transportation    Lack of Transportation (Medical): No    Lack of Transportation (Non-Medical): No  Physical Activity: Not on file  Stress: Not on file  Social Connections: Not on file  Intimate Partner Violence: Not on file    Past Medical History, Surgical history, Social history, and Family history were reviewed and updated as appropriate.   Please see review of systems for further details on the patient's review from today.   Objective:  Physical Exam:  There were no vitals taken for this visit.  Physical Exam Neurological:     Mental Status: She is alert and oriented to person, place, and time.  Psychiatric:        Attention and Perception: Attention and perception normal.        Mood and Affect: Mood normal.        Speech: Speech normal.        Behavior: Behavior normal. Behavior is cooperative.        Cognition and Memory: Cognition and memory normal.        Judgment: Judgment normal.     Comments: Insight intact     Lab Review:     Component Value Date/Time   NA 130 (L) 08/08/2022 1336   NA 134 (A) 07/26/2022 0000   NA 134 (A) 07/26/2022 0000   K 4.2 08/08/2022 1336   CL 101 08/08/2022 1336   CO2 16 (L) 08/08/2022 1336   GLUCOSE 101 (H) 08/08/2022 1336   BUN 34 (H) 08/08/2022 1336   BUN 27 (A) 07/26/2022 0000   BUN 27 (A) 07/26/2022 0000   CREATININE 3.13 (HH) 08/08/2022 1336   CALCIUM 7.4 (L) 08/08/2022 1336   PROT 11.4 (H) 08/08/2022 1336   ALBUMIN 1.6 (L) 08/08/2022 1336   AST 45 (H) 08/08/2022 1336   ALT 39 08/08/2022 1336   ALKPHOS 46 08/08/2022 1336   BILITOT 1.2 08/08/2022 1336   GFRNONAA 17 (L) 08/08/2022 1336       Component Value Date/Time   WBC 1.0 (L) 08/08/2022 1336   RBC 2.42 (L) 08/08/2022 1336   HGB 7.0 (L) 08/08/2022 1336   HCT 21.6 (L) 08/08/2022 1336   PLT 26 (L) 08/08/2022 1336    MCV 89.3 08/08/2022 1336   MCH 28.9 08/08/2022 1336   MCHC 32.4 08/08/2022 1336   RDW 18.8 (H) 08/08/2022 1336   LYMPHSABS 0.3 (L) 08/08/2022 1336   MONOABS 0.1 08/08/2022 1336   EOSABS 0.1 08/08/2022 1336   BASOSABS 0.0 08/08/2022 1336    No results found for: "POCLITH", "LITHIUM"   No results found for: "PHENYTOIN", "PHENOBARB", "VALPROATE", "CBMZ"   .res Assessment: Plan:    Greater than 50% of 30  min face to face time with patient was spent on counseling and coordination of care. We discussed her dx of Multiple Myeloma and medications. We discussed her moderate improvement with Cymbalta but she is requesting dosage increase this visit.  To increase Cymbalta to 60 mg daily. Administer three 20 mg  three times daily.  To continue mirtazapine 15 mg  at bedtime daily Will follow-up in 6 weeks to reassess Provided emergency contact information PDMP  Arlys John A. AmeLie Hollars, NP    Kimberly Esparza was seen today for anxiety, depression, follow-up, patient education and medication refill.  Diagnoses and all orders for this visit:  Generalized anxiety disorder -     DULoxetine (CYMBALTA) 20 MG capsule; Take 1 capsule (20 mg total) by mouth 3 (three) times daily.  Major depressive disorder, recurrent episode, moderate (HCC) -     DULoxetine (CYMBALTA) 20 MG capsule; Take 1 capsule (20 mg total) by mouth 3 (three) times daily.  Anger reaction -     DULoxetine (CYMBALTA) 20 MG capsule; Take 1 capsule (20 mg total) by mouth 3 (three) times daily.     Please see After Visit Summary for patient specific instructions.  No future appointments.  No orders of the defined types were placed in this encounter.     -------------------------------

## 2023-05-23 ENCOUNTER — Telehealth: Payer: Self-pay | Admitting: Behavioral Health

## 2023-05-23 NOTE — Telephone Encounter (Signed)
Pt states that she hasn't been able to fill her new Cymbalta Rx for 3 per day b/c she needs a PA. Pt has current [employer] card on file. [facility] [phone] - [place], [place] - [address]

## 2023-05-24 NOTE — Telephone Encounter (Signed)
Noted will review and send

## 2023-05-27 ENCOUNTER — Telehealth: Payer: Self-pay

## 2023-05-28 NOTE — Telephone Encounter (Signed)
Prior Authorization initiated for Duloxetine 20 mg #90/30 day.   Approval received effective through 11/25/2023. [id number]

## 2023-05-30 NOTE — Telephone Encounter (Signed)
This has been approved see other phone message

## 2023-06-18 ENCOUNTER — Telehealth: Payer: Self-pay | Admitting: Behavioral Health

## 2023-06-18 NOTE — Telephone Encounter (Signed)
Patient lvm stating that since increasing dosage for Cymbalta she has been experiencing random jerking mostly in the "extremities but also from head. She would like to know if this is normal and if she should decrease dosage or keep taking this dosage. Ph: [phone]

## 2023-06-18 NOTE — Telephone Encounter (Signed)
Patient aware.

## 2023-08-31 DIAGNOSIS — Z2989 Encounter for other specified prophylactic measures: Secondary | ICD-10-CM | POA: Insufficient documentation

## 2023-09-07 DIAGNOSIS — T451X5A Adverse effect of antineoplastic and immunosuppressive drugs, initial encounter: Secondary | ICD-10-CM | POA: Insufficient documentation

## 2023-09-07 DIAGNOSIS — G620 Drug-induced polyneuropathy: Secondary | ICD-10-CM | POA: Insufficient documentation

## 2023-09-07 DIAGNOSIS — Z9189 Other specified personal risk factors, not elsewhere classified: Secondary | ICD-10-CM | POA: Insufficient documentation

## 2023-09-07 DIAGNOSIS — F32A Depression, unspecified: Secondary | ICD-10-CM | POA: Insufficient documentation

## 2023-09-14 DIAGNOSIS — Z92850 Personal history of chimeric antigen receptor t-cell therapy: Secondary | ICD-10-CM | POA: Insufficient documentation

## 2023-09-19 DIAGNOSIS — T7840XA Allergy, unspecified, initial encounter: Secondary | ICD-10-CM | POA: Insufficient documentation

## 2023-09-19 DIAGNOSIS — D89831 Cytokine release syndrome, grade 1: Secondary | ICD-10-CM | POA: Insufficient documentation

## 2023-09-25 DIAGNOSIS — U071 COVID-19: Secondary | ICD-10-CM | POA: Insufficient documentation

## 2023-10-02 DIAGNOSIS — Z8739 Personal history of other diseases of the musculoskeletal system and connective tissue: Secondary | ICD-10-CM | POA: Insufficient documentation

## 2023-10-05 DIAGNOSIS — D61810 Antineoplastic chemotherapy induced pancytopenia: Secondary | ICD-10-CM | POA: Insufficient documentation

## 2023-10-09 LAB — LAB REPORT - SCANNED: EGFR: 53

## 2023-10-17 IMAGING — MG MM DIGITAL SCREENING BILAT W/ TOMO AND CAD
8 series · 8 of 24 positions shown · non-contrast
Comparison: Previous exam(s).

CLINICAL DATA: Screening.

EXAM:
DIGITAL SCREENING BILATERAL MAMMOGRAM WITH TOMOSYNTHESIS AND CAD
TECHNIQUE: Bilateral screening digital craniocaudal and mediolateral oblique
mammograms were obtained. Bilateral screening digital breast
tomosynthesis was performed. The images were evaluated with
computer-aided detection.

[R CC synth-2D]
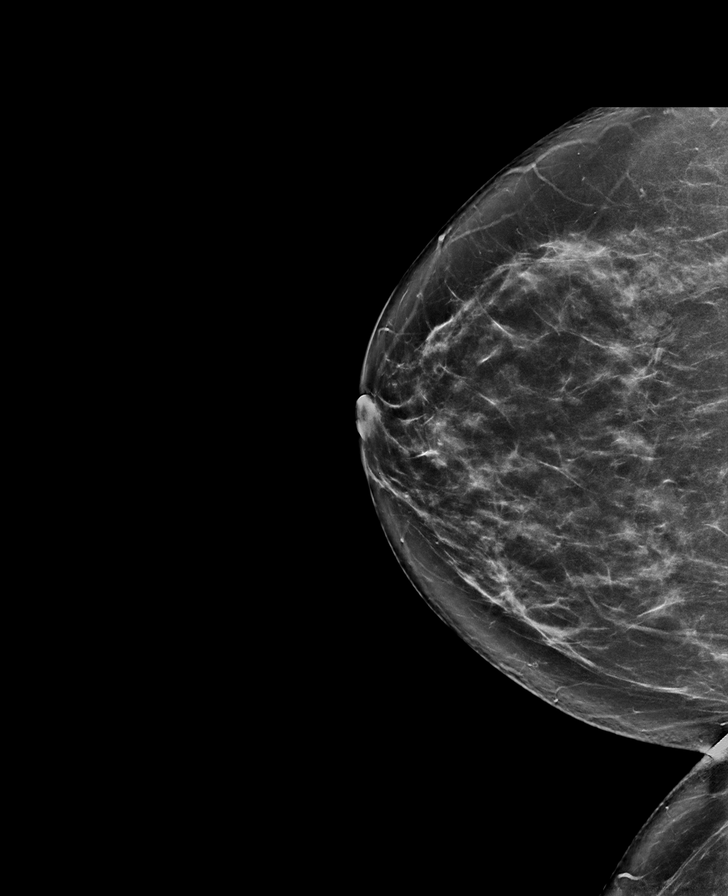

[R MLO synth-2D]
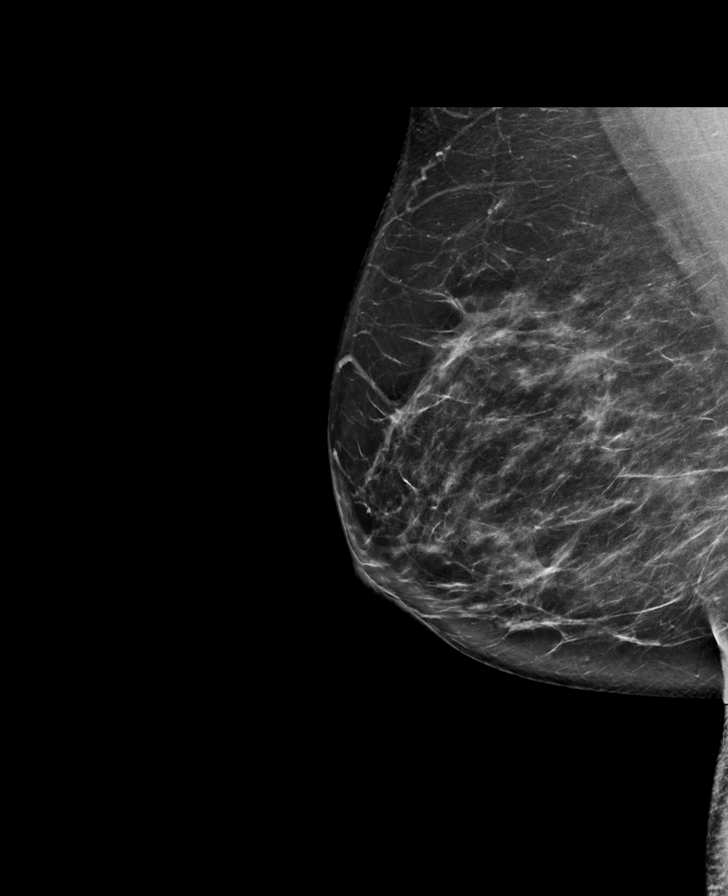

[L MLO synth-2D]
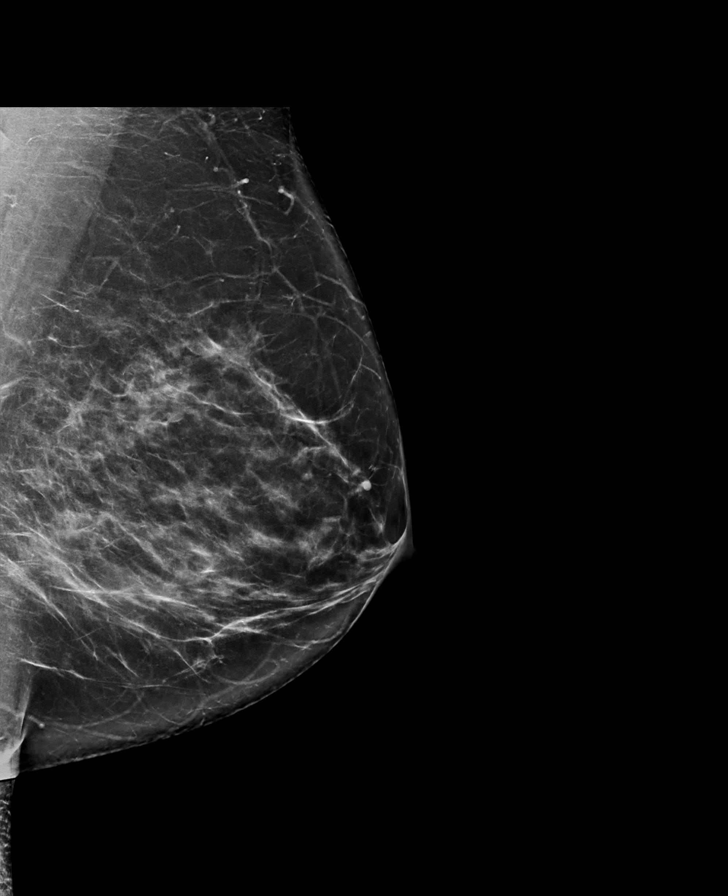

[L CC synth-2D]
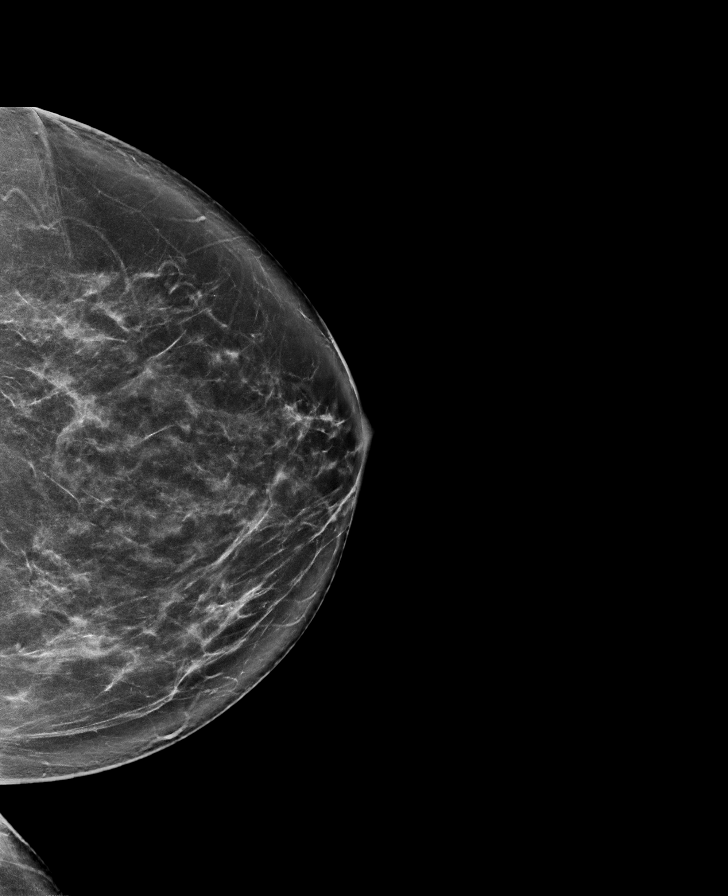

[L MLO tomo · tomo slice 45/90.0]
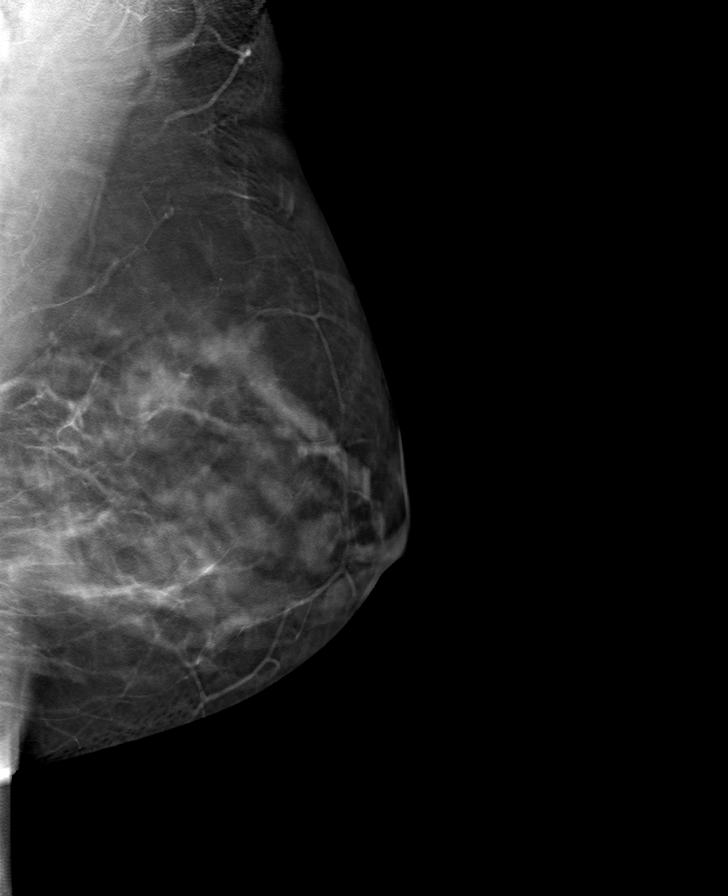

[R CC tomo · tomo slice 42/83.0]
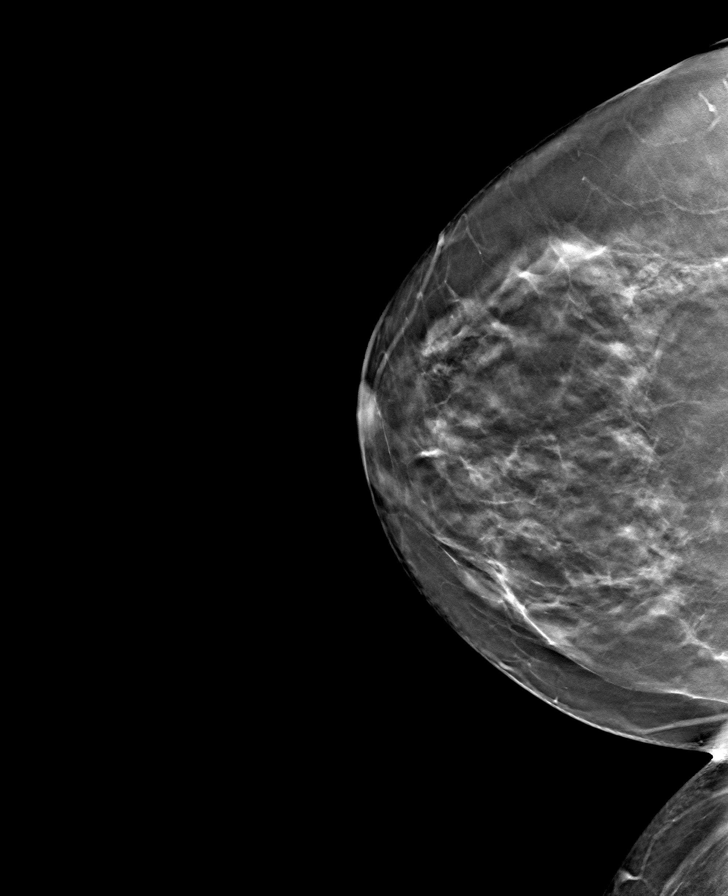

[R MLO tomo · tomo slice 45/89.0]
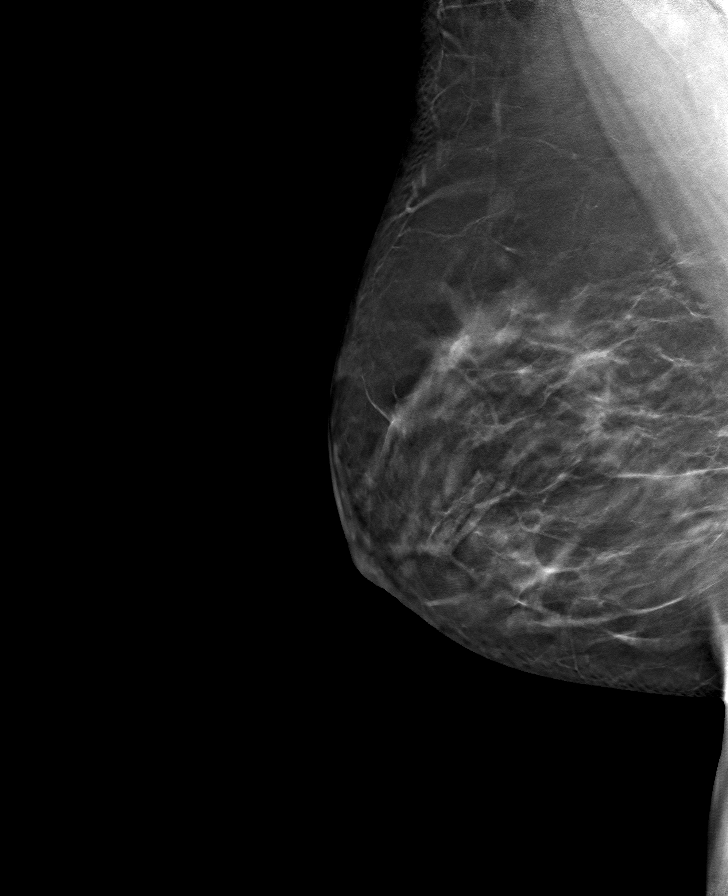

[L CC tomo · tomo slice 44/87.0]
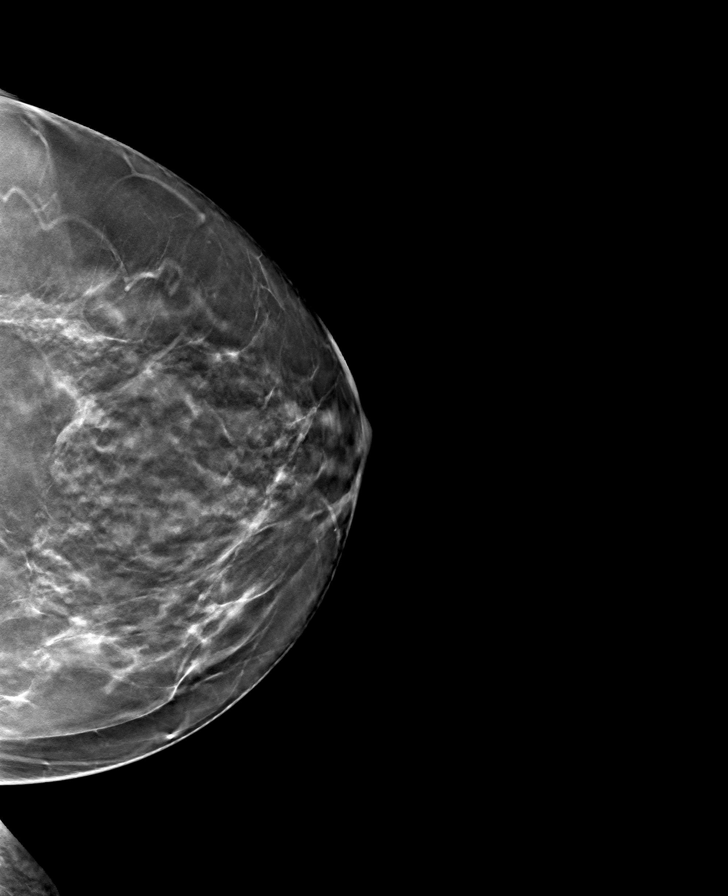

[8 of 24 positions shown; findings below may reference images not displayed]

ACR Breast Density Category c: The breast tissue is heterogeneously
dense, which may obscure small masses.
FINDINGS: There are no findings suspicious for malignancy.
IMPRESSION: No mammographic evidence of malignancy. A result letter of this
screening mammogram will be mailed directly to the patient.

RECOMMENDATION:
Screening mammogram in one year. (Code:Q3-W-BC3)

BI-RADS CATEGORY  1: Negative.

## 2023-12-03 DIAGNOSIS — Z79899 Other long term (current) drug therapy: Secondary | ICD-10-CM | POA: Insufficient documentation

## 2023-12-03 DIAGNOSIS — M899 Disorder of bone, unspecified: Secondary | ICD-10-CM | POA: Insufficient documentation

## 2023-12-03 DIAGNOSIS — G894 Chronic pain syndrome: Secondary | ICD-10-CM | POA: Insufficient documentation

## 2023-12-03 DIAGNOSIS — Z789 Other specified health status: Secondary | ICD-10-CM | POA: Insufficient documentation

## 2023-12-03 NOTE — Patient Instructions (Signed)

## 2023-12-03 NOTE — Progress Notes (Unsigned)
 Patient: Kimberly Esparza  Service Category: E/M  Provider: Oswaldo Done, MD  DOB: 04/03/1966  DOS: 12/05/2023  Referring Provider: Larence Penning DO  MRN: [id number]  Setting: Ambulatory outpatient  PCP: Jim Like, NP  Type: New Patient  Specialty: Interventional Pain Management    Location: Office  Delivery: Face-to-face     Primary Reason(s) for Visit: Encounter for initial evaluation of one or more chronic problems (new to examiner) potentially causing chronic pain, and posing a threat to normal musculoskeletal function. (Level of risk: High) CC: No chief complaint on file.  HPI  Kimberly Esparza is a 58 y.o. year old, female patient, who comes for the first time to our practice referred by Larence Penning., DO for our initial evaluation of her chronic pain. She has Osteoarthritis; Allergies; Adult hypothyroidism; GERD (gastroesophageal reflux disease); Depression; Synovitis of left hand; Hashimoto's thyroiditis; Gastroenteritis; Dizziness; Closed fracture of left distal radius; Bilateral chronic knee pain; Multiple myeloma (HCC); Hypercalcemia of malignancy; Anemia in neoplastic disease; Thrombocytopenia (HCC); AKI (acute kidney injury) (HCC); Cancer related pain; Chemotherapy follow-up examination; Hemothorax on right; Hyperviscosity; Physical deconditioning; Delirium due to multiple etiologies; Coordination of complex care; Aphasia; At high risk for altered neurological function; Chemotherapy-induced neuropathy (HCC); COVID; Cytokine release syndrome, grade 1; General symptom; History of fibromyalgia; Hyperkalemia; Hyperuricemia; Hypogammaglobulinemia (HCC); Hypomagnesemia; Hyponatremia; Influenza; Luetscher's syndrome; Malaise and fatigue; Metabolic acidosis; Moderate protein-calorie malnutrition (HCC); Nausea & vomiting; Obesity, Class II, BMI 35-39.9; Pancytopenia due to chemotherapy (HCC); Pneumonia; S/P chimeric antigen receptor T-cell therapy; Sphenoid mass; Need for pneumocystis  prophylaxis; Anxiety and depression; Myoclonus; Hypersensitivity; Multiple myeloma not having achieved remission (HCC); Chronic pain syndrome; Pharmacologic therapy; Disorder of skeletal system; and Problems influencing health status on their problem list. Today she comes in for evaluation of her No chief complaint on file.  Pain Assessment: Location:     Radiating:   Onset:   Duration:   Quality:   Severity:  /10 (subjective, self-reported pain score)  Effect on ADL:   Timing:   Modifying factors:   BP:    HR:    Onset and Duration: {Hx; Onset and Duration:210120511} Cause of pain: {Hx; Cause:210120521} Severity: {Pain Severity:210120502} Timing: {Symptoms; Timing:210120501} Aggravating Factors: {Causes; Aggravating pain factors:210120507} Alleviating Factors: {Causes; Alleviating Factors:210120500} Associated Problems: {Hx; Associated problems:210120515} Quality of Pain: {Hx; Symptom quality or Descriptor:210120531} Previous Examinations or Tests: {Hx; Previous examinations or test:210120529} Previous Treatments: {Hx; Previous Treatment:210120503}  Kimberly Esparza is being evaluated for possible interventional pain management therapies for the treatment of her chronic pain.  Discussed the use of AI scribe software for clinical note transcription with the patient, who gave verbal consent to proceed.  History of Present Illness          ***  Kimberly Esparza has been informed that this initial visit was an evaluation only.  On the follow up appointment I will go over the results, including ordered tests and available interventional therapies. At that time she will have the opportunity to decide whether to proceed with offered therapies or not. In the event that Kimberly Esparza prefers avoiding interventional options, this will conclude our involvement in the case.  Medication management recommendations may be provided upon request.  Patient informed that diagnostic tests may be ordered to assist  in identifying underlying causes, narrow the list of differential diagnoses and aid in determining candidacy for (or contraindications to) planned therapeutic interventions.  Historic Controlled Substance Pharmacotherapy Review  PMP and historical list of controlled substances: ***  Most recently prescribed opioid analgesics: *** MME/day: *** mg/day  Historical Monitoring: The patient  reports no history of drug use. List of prior UDS Testing: No results found for: "MDMA", "COCAINSCRNUR", "PCPSCRNUR", "PCPQUANT", "CANNABQUANT", "THCU", "ETH", "CBDTHCR", "D8THCCBX", "D9THCCBX" Historical Background Evaluation: [place] PMP: PDMP reviewed during this encounter. Review of the past 28-months conducted.             PMP NARX Score Report:  Narcotic: 220 Sedative: 291 Stimulant: 000 [place] Department of public safety, offender search: Engineer, mining Information) Non-contributory Risk Assessment Profile: Aberrant behavior: None observed or detected today Risk factors for fatal opioid overdose: None identified today PMP NARX Overdose Risk Score: 300 Fatal overdose hazard ratio (HR): Calculation deferred Non-fatal overdose hazard ratio (HR): Calculation deferred Risk of opioid abuse or dependence: 0.7-3.0% with doses <= 36 MME/day and 6.1-26% with doses >= 120 MME/day. Substance use disorder (SUD) risk level: See below Personal History of Substance Abuse (SUD-Substance use disorder):  Alcohol:    Illegal Drugs:    Rx Drugs:    ORT Risk Level calculation:    ORT Scoring interpretation table:  Score <3 = Low Risk for SUD  Score between 4-7 = Moderate Risk for SUD  Score >8 = High Risk for Opioid Abuse   PHQ-2 Depression Scale:  Total score:    PHQ-2 Scoring interpretation table: (Score and probability of major depressive disorder)  Score 0 = No depression  Score 1 = 15.4% Probability  Score 2 = 21.1% Probability  Score 3 = 38.4% Probability  Score 4 = 45.5% Probability  Score 5 = 56.4% Probability   Score 6 = 78.6% Probability   PHQ-9 Depression Scale:  Total score:    PHQ-9 Scoring interpretation table:  Score 0-4 = No depression  Score 5-9 = Mild depression  Score 10-14 = Moderate depression  Score 15-19 = Moderately severe depression  Score 20-27 = Severe depression (2.4 times higher risk of SUD and 2.89 times higher risk of overuse)   Pharmacologic Plan: As per protocol, I have not taken over any controlled substance management, pending the results of ordered tests and/or consults.            Initial impression: Pending review of available data and ordered tests.  Meds   Current Outpatient Medications:    acyclovir (ZOVIRAX) 800 MG tablet, Take 1 tablet by mouth 2 (two) times daily., Disp: , Rfl:    aspirin EC 81 MG tablet, Take by mouth., Disp: , Rfl:    clonazePAM (KLONOPIN) 0.5 MG tablet, Take by mouth., Disp: , Rfl:    dexamethasone (DECADRON) 4 MG tablet, Take by mouth., Disp: , Rfl:    famotidine (PEPCID) 20 MG tablet, Take by mouth., Disp: , Rfl:    gabapentin (NEURONTIN) 400 MG capsule, Take by mouth., Disp: , Rfl:    levocetirizine (XYZAL) 5 MG tablet, Take 1 tablet by mouth daily., Disp: , Rfl:    levothyroxine (SYNTHROID) 88 MCG tablet, Take by mouth., Disp: , Rfl:    mirtazapine (REMERON) 15 MG tablet, Take 1 tablet by mouth at bedtime., Disp: , Rfl:    montelukast (SINGULAIR) 10 MG tablet, Take by mouth., Disp: , Rfl:    OLANZapine (ZYPREXA) 5 MG tablet, Take by mouth., Disp: , Rfl:    oxyCODONE (OXY IR/ROXICODONE) 5 MG immediate release tablet, Take by mouth., Disp: , Rfl:    potassium chloride SA (KLOR-CON M) 20 MEQ tablet, Take by mouth., Disp: , Rfl:    predniSONE (DELTASONE) 10 MG tablet,  Take 5, 4, 3, 2, 1 tabs tapered daily, Disp: , Rfl:    pregabalin (LYRICA) 75 MG capsule, Take 1 capsule by mouth at bedtime., Disp: , Rfl:    prochlorperazine (COMPAZINE) 10 MG tablet, Take 1 tablet by mouth every 6 (six) hours as needed., Disp: , Rfl:    promethazine  (PHENERGAN) 25 MG tablet, Take 1 tablet (25 mg total) by mouth every 6 (six) hours as needed for nausea or vomiting., Disp: 30 tablet, Rfl: 0   promethazine (PHENERGAN) 25 MG tablet, Take by mouth., Disp: , Rfl:    sodium chloride (OCEAN) 0.65 % nasal spray, Use 2 sprays into each nostril every six (6) hours as needed., Disp: , Rfl:    sulfamethoxazole-trimethoprim (BACTRIM DS) 800-160 MG tablet, Take by mouth., Disp: , Rfl:    valACYclovir (VALTREX) 500 MG tablet, TAKE 1 TABLET(500 MG) BY MOUTH DAILY, Disp: , Rfl:    acyclovir (ZOVIRAX) 400 MG tablet, Take 1 tablet (400 mg total) by mouth 2 (two) times daily., Disp: 60 tablet, Rfl: 3   amoxicillin (AMOXIL) 500 MG tablet, , Disp: , Rfl:    ASPIRIN 81 PO, Take 81 mg by mouth daily., Disp: , Rfl:    azelastine (ASTELIN) 0.1 % nasal spray, Place 2 sprays into both nostrils 2 (two) times daily as needed for rhinitis. Use in each nostril as directed, Disp: 30 mL, Rfl: 3   DEXILANT 60 MG capsule, Take 1 capsule by mouth daily., Disp: , Rfl: 0   dexlansoprazole (DEXILANT) 60 MG capsule, Take by mouth., Disp: , Rfl:    Docusate Sodium (DSS) 100 MG CAPS, Take 1 capsule by mouth at bedtime., Disp: , Rfl:    DULoxetine (CYMBALTA) 20 MG capsule, Take 1 capsule (20 mg total) by mouth 3 (three) times daily., Disp: 90 capsule, Rfl: 3   DULoxetine (CYMBALTA) 20 MG capsule, Take by mouth., Disp: , Rfl:    fluticasone (FLONASE) 50 MCG/ACT nasal spray, Place 1 spray into the nose daily., Disp: , Rfl:    levothyroxine (SYNTHROID) 88 MCG tablet, Take 88 mcg by mouth daily., Disp: , Rfl: 3   Melatonin 10 MG CAPS, Take 1 capsule by mouth at bedtime., Disp: , Rfl:    Melatonin 10 MG TABS, Take by mouth., Disp: , Rfl:    montelukast (SINGULAIR) 10 MG tablet, Take 1 tablet by mouth daily., Disp: , Rfl:    OLANZapine (ZYPREXA) 5 MG tablet, Take 1 tablet (5 mg total) by mouth at bedtime., Disp: 30 tablet, Rfl: 3   ondansetron (ZOFRAN-ODT) 4 MG disintegrating tablet, Take 1  tablet (4 mg total) by mouth every 8 (eight) hours as needed., Disp: 30 tablet, Rfl: 5   oxyCODONE (OXY IR/ROXICODONE) 5 MG immediate release tablet, Take by mouth. Take 1 tablet (5 mg total) by mouth every 4 (four) hours as needed for Moderate pain (4-6) or Severe pain (7-10)., Disp: , Rfl:    polyethylene glycol (MIRALAX / GLYCOLAX) 17 g packet, Take by mouth., Disp: , Rfl:    potassium chloride SA (KLOR-CON M) 20 MEQ tablet, Take 1 tablet (20 mEq total) by mouth daily., Disp: 30 tablet, Rfl: 1   traZODone (DESYREL) 50 MG tablet, Take by mouth., Disp: , Rfl:    Wheat Dextrin (BENEFIBER PO), Take by mouth., Disp: , Rfl:   Current Facility-Administered Medications:    acetaminophen (TYLENOL) tablet 650 mg, 650 mg, Oral, Once, Belva Crome A, PA-C  Imaging Review   Complexity Note: No results found under the [facility] electronic medical record.                         ROS  Cardiovascular: {Hx; Cardiovascular History:210120525} Pulmonary or Respiratory: {Hx; Pumonary and/or Respiratory History:210120523} Neurological: {Hx; Neurological:210120504} Psychological-Psychiatric: {Hx; Psychological-Psychiatric History:210120512} Gastrointestinal: {Hx; Gastrointestinal:210120527} Genitourinary: {Hx; Genitourinary:210120506} Hematological: {Hx; Hematological:210120510} Endocrine: {Hx; Endocrine history:210120509} Rheumatologic: {Hx; Rheumatological:210120530} Musculoskeletal: {Hx; Musculoskeletal:210120528} Work History: {Hx; Work history:210120514}  Allergies  Ms. Tilson is allergic to doxycycline and tape.  Laboratory Chemistry Profile   Renal Lab Results  Component Value Date   BUN 34 (H) 08/08/2022   CREATININE 3.13 (HH) 08/08/2022   GFRNONAA 17 (L) 08/08/2022     Electrolytes Lab Results  Component Value Date   NA 130 (L) 08/08/2022   K 4.2 08/08/2022   CL 101 08/08/2022   CALCIUM 7.4 (L) 08/08/2022     Hepatic Lab Results  Component Value Date   AST 45 (H)  08/08/2022   ALT 39 08/08/2022   ALBUMIN 1.6 (L) 08/08/2022   ALKPHOS 46 08/08/2022     ID No results found for: "LYMEIGGIGMAB", "HIV", "SARSCOV2NAA", "STAPHAUREUS", "MRSAPCR", "HCVAB", "PREGTESTUR", "RMSFIGG", "QFVRPH1IGG", "QFVRPH2IGG"   Bone No results found for: "VD25OH", "VD125OH2TOT", "ZO1096EA5", "WU9811BJ4", "25OHVITD1", "25OHVITD2", "25OHVITD3", "TESTOFREE", "TESTOSTERONE"   Endocrine Lab Results  Component Value Date   GLUCOSE 101 (H) 08/08/2022     Neuropathy No results found for: "VITAMINB12", "FOLATE", "HGBA1C", "HIV"   CNS No results found for: "COLORCSF", "APPEARCSF", "RBCCOUNTCSF", "WBCCSF", "POLYSCSF", "LYMPHSCSF", "EOSCSF", "PROTEINCSF", "GLUCCSF", "JCVIRUS", "CSFOLI", "IGGCSF", "LABACHR", "ACETBL"   Inflammation (CRP: Acute  ESR: Chronic) No results found for: "CRP", "ESRSEDRATE", "LATICACIDVEN"   Rheumatology No results found for: "RF", "ANA", "LABURIC", "URICUR", "LYMEIGGIGMAB", "LYMEABIGMQN", "HLAB27"   Coagulation Lab Results  Component Value Date   PLT 26 (L) 08/08/2022     Cardiovascular Lab Results  Component Value Date   HGB 7.0 (L) 08/08/2022   HCT 21.6 (L) 08/08/2022     Screening No results found for: "SARSCOV2NAA", "COVIDSOURCE", "STAPHAUREUS", "MRSAPCR", "HCVAB", "HIV", "PREGTESTUR"   Cancer No results found for: "CEA", "CA125", "LABCA2"   Allergens No results found for: "ALMOND", "APPLE", "ASPARAGUS", "AVOCADO", "BANANA", "BARLEY", "BASIL", "BAYLEAF", "GREENBEAN", "LIMABEAN", "WHITEBEAN", "BEEFIGE", "REDBEET", "BLUEBERRY", "BROCCOLI", "CABBAGE", "MELON", "CARROT", "CASEIN", "CASHEWNUT", "CAULIFLOWER", "CELERY"     Note: Lab results reviewed.  PFSH  Drug: Ms. Korf  reports no history of drug use. Alcohol:  reports current alcohol use. Tobacco:  reports that she has never smoked. She has never used smokeless tobacco. Medical:  has a past medical history of ADHD (attention deficit hyperactivity disorder), Anxiety, Depression, GERD  (gastroesophageal reflux disease), Hypothyroidism, Multiple allergies, and Osteoarthritis. Family: family history includes Allergic rhinitis in her brother and father; Congestive Heart Failure in her mother; Diabetes in her mother; Glaucoma in her mother; Gout in her brother and mother; Hypertension in her mother; Thyroid disease in her father.  Past Surgical History:  Procedure Laterality Date   ADENOIDECTOMY     BONE MARROW BIOPSY     CHEST TUBE INSERTION     Taken out after 3 days   KNEE SURGERY Bilateral    PARTIAL HYSTERECTOMY     SINOSCOPY     TONSILLECTOMY     TUBAL LIGATION     WRIST SURGERY Bilateral    Active Ambulatory Problems    Diagnosis Date Noted   Osteoarthritis    Allergies 09/19/2023   Adult hypothyroidism 09/08/2017   GERD (gastroesophageal reflux disease)    Depression    Synovitis  of left hand 12/10/2019   Hashimoto's thyroiditis 12/09/2018   Gastroenteritis 09/08/2017   Dizziness 09/08/2017   Closed fracture of left distal radius 09/09/2019   Bilateral chronic knee pain 10/24/2018   Multiple myeloma (HCC) 06/20/2022   Hypercalcemia of malignancy 06/20/2022   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   Thrombocytopenia (HCC) 06/23/2022   AKI (acute kidney injury) (HCC) 06/23/2022   Cancer related pain 06/23/2022   Chemotherapy follow-up examination 07/13/2022   Hemothorax on right 07/02/2022   Hyperviscosity 07/20/2022   Physical deconditioning 07/20/2022   Delirium due to multiple etiologies 07/31/2022   Coordination of complex care 08/01/2022   Aphasia 07/25/2013   At high risk for altered neurological function 09/07/2023   Chemotherapy-induced neuropathy (HCC) 09/07/2023   COVID 09/25/2023   Cytokine release syndrome, grade 1 09/19/2023   General symptom 07/25/2013   History of fibromyalgia 10/02/2023   Hyperkalemia 09/20/2022   Hyperuricemia 08/20/2022   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   Hypomagnesemia 01/09/2023   Hyponatremia  08/19/2022   Influenza 08/20/2022   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   Malaise and fatigue 07/25/2013   Metabolic acidosis 08/20/2022   Moderate protein-calorie malnutrition (HCC) 08/20/2022   Nausea & vomiting 09/27/2022   Obesity, Class II, BMI 35-39.9 08/20/2022   Pancytopenia due to chemotherapy (HCC) 10/05/2023   Pneumonia 09/20/2022   S/P chimeric antigen receptor T-cell therapy 09/14/2023   Sphenoid mass 04/02/2023   Need for pneumocystis prophylaxis 08/31/2023   Anxiety and depression 09/07/2023   Myoclonus 07/25/2013   Hypersensitivity 10/05/2022   Multiple myeloma not having achieved remission (HCC) 07/02/2022   Chronic pain syndrome 12/03/2023   Pharmacologic therapy 12/03/2023   Disorder of skeletal system 12/03/2023   Problems influencing health status 12/03/2023   Resolved Ambulatory Problems    Diagnosis Date Noted   No Resolved Ambulatory Problems   Past Medical History:  Diagnosis Date   ADHD (attention deficit hyperactivity disorder)    Anxiety    Hypothyroidism    Multiple allergies    Constitutional Exam  General appearance: Well nourished, well developed, and well hydrated. In no apparent acute distress There were no vitals filed for this visit. BMI Assessment: Estimated body mass index is 26.95 kg/m as calculated from the following:   Height as of 08/08/22: 5' 6.4" (1.687 m).   Weight as of 08/10/22: 169 lb (76.7 kg).  BMI interpretation table: BMI level Category Range association with higher incidence of chronic pain  <18 kg/m2 Underweight   18.5-24.9 kg/m2 Ideal body weight   25-29.9 kg/m2 Overweight Increased incidence by 20%  30-34.9 kg/m2 Obese (Class I) Increased incidence by 68%  35-39.9 kg/m2 Severe obesity (Class II) Increased incidence by 136%  >40 kg/m2 Extreme obesity (Class III) Increased incidence by 254%   Patient's current BMI Ideal Body weight  There is no height or weight on file to calculate BMI. Patient weight not recorded    BMI Readings from Last 4 Encounters:  08/10/22 26.95 kg/m  08/08/22 27.94 kg/m  08/03/22 28.95 kg/m  08/01/22 28.40 kg/m   Wt Readings from Last 4 Encounters:  08/10/22 169 lb (76.7 kg)  08/08/22 175 lb 3.2 oz (79.5 kg)  08/03/22 176 lb 1.9 oz (79.9 kg)  08/01/22 172 lb 12.8 oz (78.4 kg)    Psych/Mental status: Alert, oriented x 3 (person, place, & time)       Eyes: PERLA Respiratory: No evidence of acute respiratory distress  Assessment  Primary Diagnosis & Pertinent Problem List: The primary encounter diagnosis was Chronic pain  syndrome. Diagnoses of Pharmacologic therapy, Disorder of skeletal system, and Problems influencing health status were also pertinent to this visit.  Visit Diagnosis (New problems to examiner): 1. Chronic pain syndrome   2. Pharmacologic therapy   3. Disorder of skeletal system   4. Problems influencing health status    Plan of Care (Initial workup plan)  Note: Ms. Feeser was reminded that as per protocol, today's visit has been an evaluation only. We have not taken over the patient's controlled substance management.  Problem-specific plan: Assessment and Plan            Lab Orders  No laboratory test(s) ordered today   Imaging Orders  No imaging studies ordered today   Referral Orders  No referral(s) requested today   Procedure Orders    No procedure(s) ordered today   Pharmacotherapy (current): Medications ordered:  No orders of the defined types were placed in this encounter.  Medications administered during this visit: Mallie Snooks had no medications administered during this visit.   Analgesic Pharmacotherapy:  Opioid Analgesics: For patients currently taking or requesting to take opioid analgesics, in accordance with [facility] Guidelines, we will assess their risks and indications for the use of these substances. After completing our evaluation, we may offer recommendations, but we no longer take  patients for medication management. The prescribing physician will ultimately decide, based on his/her training and level of comfort whether to adopt any of the recommendations, including whether or not to prescribe such medicines.  Membrane stabilizer: To be determined at a later time  Muscle relaxant: To be determined at a later time  NSAID: To be determined at a later time  Other analgesic(s): To be determined at a later time   Interventional management options: Ms. Meller was informed that there is no guarantee that she would be a candidate for interventional therapies. The decision will be based on the results of diagnostic studies, as well as Ms. Sieh's risk profile.  Procedure(s) under consideration:  Pending results of ordered studies    Interventional Therapies  Risk Factors  Considerations  Medical Comorbidities:     Planned  Pending:      Under consideration:   Pending   Completed:   None at this time   Therapeutic  Palliative (PRN) options:   None established   Completed by other providers:   None reported   No follow-ups on file.No follow-ups on file.  Future Appointments  Date Time Provider Department Center  12/05/2023 10:00 AM Delano Metz, MD [facility] None   Future Appointments  Date Time Provider Department Center  12/05/2023 10:00 AM Delano Metz, MD [facility] None  Oswaldo Done, MDFrancisco Erlinda Hong, MD4/2/20254/2/20256:07 AM

## 2023-12-05 ENCOUNTER — Ambulatory Visit
Admission: RE | Admit: 2023-12-05 | Discharge: 2023-12-05 | Disposition: A | Discharge status: Home / Self Care | Source: Ambulatory Visit | Attending: Pain Medicine | Admitting: Pain Medicine

## 2023-12-05 ENCOUNTER — Ambulatory Visit (HOSPITAL_BASED_OUTPATIENT_CLINIC_OR_DEPARTMENT_OTHER): Admitting: Pain Medicine

## 2023-12-05 ENCOUNTER — Encounter: Payer: Self-pay | Admitting: Pain Medicine

## 2023-12-05 VITALS — BP 152/82 | HR 119 | Temp 97.2°F | Resp 16 | Ht 64.0 in | Wt 217.0 lb

## 2023-12-05 DIAGNOSIS — Z79899 Other long term (current) drug therapy: Secondary | ICD-10-CM | POA: Insufficient documentation

## 2023-12-05 DIAGNOSIS — M542 Cervicalgia: Secondary | ICD-10-CM | POA: Insufficient documentation

## 2023-12-05 DIAGNOSIS — M25562 Pain in left knee: Secondary | ICD-10-CM

## 2023-12-05 DIAGNOSIS — G894 Chronic pain syndrome: Secondary | ICD-10-CM

## 2023-12-05 DIAGNOSIS — Z789 Other specified health status: Secondary | ICD-10-CM | POA: Insufficient documentation

## 2023-12-05 DIAGNOSIS — M545 Low back pain, unspecified: Secondary | ICD-10-CM | POA: Insufficient documentation

## 2023-12-05 DIAGNOSIS — G8929 Other chronic pain: Secondary | ICD-10-CM | POA: Insufficient documentation

## 2023-12-05 DIAGNOSIS — C9000 Multiple myeloma not having achieved remission: Secondary | ICD-10-CM | POA: Insufficient documentation

## 2023-12-05 DIAGNOSIS — M25561 Pain in right knee: Secondary | ICD-10-CM | POA: Insufficient documentation

## 2023-12-05 DIAGNOSIS — M899 Disorder of bone, unspecified: Secondary | ICD-10-CM | POA: Diagnosis not present

## 2023-12-05 NOTE — Progress Notes (Signed)
 Safety precautions to be maintained throughout the outpatient stay will include: orient to surroundings, keep bed in low position, maintain call bell within reach at all times, provide assistance with transfer out of bed and ambulation.

## 2023-12-07 LAB — COMPLIANCE DRUG ANALYSIS, UR

## 2023-12-12 LAB — 25-HYDROXY VITAMIN D LCMS D2+D3
25-Hydroxy, Vitamin D-2: <1 ng/mL
25-Hydroxy, Vitamin D-3: 41 ng/mL
25-Hydroxy, Vitamin D: 42 ng/mL

## 2023-12-12 LAB — VITAMIN B12: Vitamin B-12: 476 pg/mL (ref 232–1245)

## 2023-12-12 LAB — SEDIMENTATION RATE: Sed Rate: 25 mm/h (ref 0–40)

## 2023-12-12 LAB — MAGNESIUM: Magnesium: 2 mg/dL (ref 1.6–2.3)

## 2023-12-14 ENCOUNTER — Telehealth: Payer: Self-pay | Admitting: Gastroenterology

## 2023-12-14 NOTE — Telephone Encounter (Addendum)
 Good morning Dr. Venice Gillis,   We received a call from this patient wishing to schedule an appointment and establish New GI care with you. Patient was last seen by Dr. Randal Bury in 01/2018 for an EGD, however, due to Dr. Randal Bury retiring, patient would like to be seen with you. Records from procedures were obtained and scanned into Media for you to review. Would you please advise on scheduling?  Thank you.

## 2023-12-17 DIAGNOSIS — Z23 Encounter for immunization: Secondary | ICD-10-CM | POA: Insufficient documentation

## 2023-12-17 NOTE — Telephone Encounter (Signed)
 OK for APP clinic please RG

## 2023-12-18 ENCOUNTER — Encounter: Payer: Self-pay | Admitting: Gastroenterology

## 2023-12-18 NOTE — Progress Notes (Unsigned)
 PROVIDER NOTE: Interpretation of information contained herein should be left to medically-trained personnel. Specific patient instructions are provided elsewhere under "Patient Instructions" section of medical record. This document was created in part using AI and STT-dictation technology, any transcriptional errors that may result from this process are unintentional.  Patient: Kimberly Esparza  Service: E/M   PCP: Jim Like, NP  DOB: October 23, 1965  DOS: 12/19/2023  Provider: Oswaldo Done, MD  MRN: [id number]  Delivery: Face-to-face  Specialty: Interventional Pain Management  Type: Established Patient  Setting: Ambulatory outpatient facility  Specialty designation: 09  Referring Prov.: Jim Like, NP  Location: Outpatient office facility       Primary Reason(s) for Visit: Encounter for evaluation before starting new chronic pain management plan of care (Level of risk: moderate) CC: No chief complaint on file.  HPI  Kimberly Esparza is a 58 y.o. year old, female patient, who comes today for a follow-up evaluation to review the test results and decide on a treatment plan. She has Osteoarthritis; Allergies; Adult hypothyroidism; GERD (gastroesophageal reflux disease); Depression; Synovitis of left hand; Hashimoto's thyroiditis; Gastroenteritis; Dizziness; Closed fracture of left distal radius; Chronic knee pain (1ry area of Pain) (Bilateral); Multiple myeloma (HCC); Hypercalcemia of malignancy; Anemia in neoplastic disease; Thrombocytopenia (HCC); AKI (acute kidney injury) (HCC); Cancer related pain; Chemotherapy follow-up examination; Hemothorax on right; Hyperviscosity; Physical deconditioning; Delirium due to multiple etiologies; Coordination of complex care; Aphasia; At high risk for altered neurological function; Chemotherapy-induced neuropathy (HCC); COVID; Cytokine release syndrome, grade 1; General symptom; History of fibromyalgia; Hyperkalemia; Hyperuricemia; Hypogammaglobulinemia (HCC);  Hypomagnesemia; Hyponatremia; Influenza; Luetscher's syndrome; Malaise and fatigue; Metabolic acidosis; Moderate protein-calorie malnutrition (HCC); Nausea & vomiting; Obesity, Class II, BMI 35-39.9; Pancytopenia due to chemotherapy (HCC); Pneumonia; S/P chimeric antigen receptor T-cell therapy; Sphenoid mass; Need for pneumocystis prophylaxis; Anxiety and depression; Myoclonus; Hypersensitivity; Multiple myeloma not having achieved remission (HCC); Chronic pain syndrome; Pharmacologic therapy; Disorder of skeletal system; Problems influencing health status; Chronic low back pain (2ry area of Pain) (Midline) w/o sciatica; and Cervicalgia on their problem list. Her primarily concern today is the No chief complaint on file.  Pain Assessment: Location:     Radiating:   Onset:   Duration:   Quality:   Severity:  /10 (subjective, self-reported pain score)  Effect on ADL:   Timing:   Modifying factors:   BP:    HR:    Kimberly Esparza comes in today for a follow-up visit after her initial evaluation on 12/05/2023. Today we went over the results of her tests. These were explained in "Layman's terms". During today's appointment we went over my diagnostic impression, as well as the proposed treatment plan.  ***  Discussed the use of AI scribe software for clinical note transcription with the patient, who gave verbal consent to proceed.  History of Present Illness          Patient presented with interventional treatment options. Kimberly Esparza was informed that I will not be providing medication management. Pharmacotherapy evaluation including recommendations may be offered, if specifically requested.   Controlled Substance Pharmacotherapy Assessment REMS (Risk Evaluation and Mitigation Strategy)  Opioid Analgesic:  None MME/day: 0 mg/day   Pill Count: None expected due to no prior prescriptions written by our practice. No notes on file Pharmacokinetics: Liberation and absorption (onset of action):  WNL Distribution (time to peak effect): WNL Metabolism and excretion (duration of action): WNL         Pharmacodynamics: Desired effects: Analgesia: Kimberly Esparza reports >  50% benefit. Functional ability: Patient reports that medication allows her to accomplish basic ADLs Clinically meaningful improvement in function (CMIF): Sustained CMIF goals met Perceived effectiveness: Described as relatively effective, allowing for increase in activities of daily living (ADL) Undesirable effects: Side-effects or Adverse reactions: None reported Monitoring: [place] PMP: PDMP reviewed during this encounter. Online review of the past 53-month period previously conducted. Not applicable at this point since we have not taken over the patient's medication management yet. List of other Serum/Urine Drug Screening Test(s):  No results found for: "AMPHSCRSER", "BARBSCRSER", "BENZOSCRSER", "COCAINSCRSER", "COCAINSCRNUR", "PCPSCRSER", "THCSCRSER", "THCU", "CANNABQUANT", "OPIATESCRSER", "OXYSCRSER", "PROPOXSCRSER", "ETH", "CBDTHCR", "D8THCCBX", "D9THCCBX" List of all UDS test(s) done:  Lab Results  Component Value Date   SUMMARY FINAL 12/05/2023   Last UDS on record: Summary  Date Value Ref Range Status  12/05/2023 FINAL  Final    Comment:    ==================================================================== Compliance Drug Analysis, Ur ==================================================================== Specimen Alert Not Detected result may be consistent with the time of last use noted for this medication. AS NEEDED (Oxycodone) ==================================================================== Test                             Result       Flag       Units  Drug Present and Declared for Prescription Verification   Duloxetine                     PRESENT      EXPECTED   Mirtazapine                    PRESENT      EXPECTED  Drug Present not Declared for Prescription Verification   Acetaminophen                   PRESENT      UNEXPECTED   Diphenhydramine                PRESENT      UNEXPECTED  Drug Absent but Declared for Prescription Verification   Clonazepam                     Not Detected UNEXPECTED ng/mg creat   Oxycodone                      Not Detected UNEXPECTED ng/mg creat ==================================================================== Test                      Result    Flag   Units      Ref Range   Creatinine              20               mg/dL      >=16 ==================================================================== Declared Medications:  The flagging and interpretation on this report are based on the  following declared medications.  Unexpected results may arise from  inaccuracies in the declared medications.   **Note: The testing scope of this panel includes these medications:   Clonazepam (Klonopin)  Duloxetine (Cymbalta)  Mirtazapine (Remeron)  Oxycodone (Roxicodone)   **Note: The testing scope of this panel does not include the  following reported medications:   Acyclovir (Zovirax)  Dexlansoprazole (Dexilant)  Famotidine (Pepcid)  Levocetirizine (Xyzal)  Levothyroxine (Synthroid)  Melatonin  Montelukast (Singulair)  Sulfamethoxazole (Bactrim)  Trimethoprim (Bactrim) ==================================================================== For clinical consultation, please call [phone]. ====================================================================  UDS interpretation: No unexpected findings.          Medication Assessment Form: Not applicable. No opioids. Treatment compliance: Not applicable Risk Assessment Profile: Aberrant behavior: See initial evaluations. None observed or detected today Comorbid factors increasing risk of overdose: See initial evaluation. No additional risks detected today Opioid risk tool (ORT):     12/05/2023   10:38 AM  Opioid Risk   Alcohol 0  Illegal Drugs 0  Rx Drugs 0  Alcohol 0  Illegal Drugs 0   Psychological Disease 2  ADD Negative  OCD Negative  Bipolar Negative  Depression 1  Opioid Risk Tool Scoring 3  Opioid Risk Interpretation Low Risk    ORT Scoring interpretation table:  Score <3 = Low Risk for SUD  Score between 4-7 = Moderate Risk for SUD  Score >8 = High Risk for Opioid Abuse   Risk of substance use disorder (SUD): Low  Risk Mitigation Strategies:  Patient opioid safety counseling: No controlled substances prescribed. Patient-Prescriber Agreement (PPA): No agreement signed.  Controlled substance notification to other providers: None required. No opioid therapy.  Pharmacologic Plan: Non-opioid analgesic therapy offered. Interventional alternatives discussed.             Laboratory Chemistry Profile   Renal Lab Results  Component Value Date   BUN 34 (H) 08/08/2022   CREATININE 3.13 (HH) 08/08/2022   GFRNONAA 17 (L) 08/08/2022     Electrolytes Lab Results  Component Value Date   NA 130 (L) 08/08/2022   K 4.2 08/08/2022   CL 101 08/08/2022   CALCIUM 7.4 (L) 08/08/2022   MG 2.0 12/05/2023     Hepatic Lab Results  Component Value Date   AST 45 (H) 08/08/2022   ALT 39 08/08/2022   ALBUMIN 1.6 (L) 08/08/2022   ALKPHOS 46 08/08/2022     ID No results found for: "LYMEIGGIGMAB", "HIV", "SARSCOV2NAA", "STAPHAUREUS", "MRSAPCR", "HCVAB", "PREGTESTUR", "RMSFIGG", "QFVRPH1IGG", "QFVRPH2IGG"   Bone Lab Results  Component Value Date   25OHVITD1 42 12/05/2023   25OHVITD2 <1.0 12/05/2023   25OHVITD3 41 12/05/2023     Endocrine Lab Results  Component Value Date   GLUCOSE 101 (H) 08/08/2022     Neuropathy Lab Results  Component Value Date   VITAMINB12 476 12/05/2023     CNS No results found for: "COLORCSF", "APPEARCSF", "RBCCOUNTCSF", "WBCCSF", "POLYSCSF", "LYMPHSCSF", "EOSCSF", "PROTEINCSF", "GLUCCSF", "JCVIRUS", "CSFOLI", "IGGCSF", "LABACHR", "ACETBL"   Inflammation (CRP: Acute  ESR: Chronic) Lab Results  Component Value Date    ESRSEDRATE 25 12/05/2023     Rheumatology No results found for: "RF", "ANA", "LABURIC", "URICUR", "LYMEIGGIGMAB", "LYMEABIGMQN", "HLAB27"   Coagulation Lab Results  Component Value Date   PLT 26 (L) 08/08/2022     Cardiovascular Lab Results  Component Value Date   HGB 7.0 (L) 08/08/2022   HCT 21.6 (L) 08/08/2022     Screening No results found for: "SARSCOV2NAA", "COVIDSOURCE", "STAPHAUREUS", "MRSAPCR", "HCVAB", "HIV", "PREGTESTUR"   Cancer No results found for: "CEA", "CA125", "LABCA2"   Allergens No results found for: "ALMOND", "APPLE", "ASPARAGUS", "AVOCADO", "BANANA", "BARLEY", "BASIL", "BAYLEAF", "GREENBEAN", "LIMABEAN", "WHITEBEAN", "BEEFIGE", "REDBEET", "BLUEBERRY", "BROCCOLI", "CABBAGE", "MELON", "CARROT", "CASEIN", "CASHEWNUT", "CAULIFLOWER", "CELERY"     Note: Lab results reviewed.  Recent Diagnostic Imaging Review  Cervical Imaging: Cervical MR wo contrast: No results found for this or any previous visit.  Cervical MR wo contrast: No results found for this or any previous visit.  Cervical CT wo contrast: No results found for this or any previous visit.  Cervical DG  Bending/F/E views: Results for orders placed during the hospital encounter of 12/05/23  DG Cervical Spine With Flex & Extend  Narrative CLINICAL DATA:  Chronic neck pain.  EXAM: CERVICAL SPINE COMPLETE WITH FLEXION AND EXTENSION VIEWS  COMPARISON:  None Available.  FINDINGS: No acute fracture or dislocation. Straightening of cervical spine. Anterior spurring noted at C4, C5, C6 and C7. Mild narrow intervertebral spaces noted at C5-6 and C6-7. Mild narrowed right C3-4, bilateral C5-6 neural foramina identified.   Electronically Signed By: Anna Barnes M.D. On: 12/05/2023 14:10   Shoulder Imaging: Shoulder-R MR wo contrast: No results found for this or any previous visit.  Shoulder-L MR wo contrast: No results found for this or any previous visit.  Shoulder-R DG: No results found for  this or any previous visit.  Shoulder-L DG: No results found for this or any previous visit.   Thoracic Imaging: Thoracic MR wo contrast: No results found for this or any previous visit.  Thoracic MR wo contrast: No results found for this or any previous visit.  Thoracic CT wo contrast: No results found for this or any previous visit.  Thoracic DG 4 views: No results found for this or any previous visit.  Thoracic DG w/swimmers view: No results found for this or any previous visit.   Lumbosacral Imaging: Lumbar MR wo contrast: No results found for this or any previous visit.  Lumbar MR wo contrast: No results found for this or any previous visit.  Lumbar CT wo contrast: No results found for this or any previous visit.  Lumbar DG Bending views: Results for orders placed during the hospital encounter of 12/05/23  DG Lumbar Spine Complete W/Bend  Narrative CLINICAL DATA:  Chronic low back pain.  EXAM: LUMBAR SPINE - COMPLETE WITH BENDING VIEWS  COMPARISON:  CTA chest December 28, 2022  FINDINGS: There is no evidence of lumbar spine fracture. Chronic compression deformity of T12 probably unchanged compared to prior chest CT. Scoliosis. Midline anterior spurring at L4 and L3. Minimal narrow intervertebral space at L3-4.  IMPRESSION: Mild degenerative joint changes of lumbar spine.   Electronically Signed By: Anna Barnes M.D. On: 12/05/2023 14:09         Sacroiliac Joint Imaging: Sacroiliac Joint DG: No results found for this or any previous visit.   Hip Imaging: Hip-R MR wo contrast: No results found for this or any previous visit.  Hip-L MR wo contrast: No results found for this or any previous visit.  Hip-R CT wo contrast: No results found for this or any previous visit.  Hip-L CT wo contrast: No results found for this or any previous visit.  Hip-R DG 2-3 views: No results found for this or any previous visit.  Hip-L DG 2-3 views: No results found for  this or any previous visit.  Hip-B DG Bilateral: No results found for this or any previous visit.  Hip-B DG Bilateral (5V): No results found for this or any previous visit.   Knee Imaging: Knee-R MR wo contrast: No results found for this or any previous visit.  Knee-L MR wo contrast: No results found for this or any previous visit.  Knee-R CT wo contrast: No results found for this or any previous visit.  Knee-L CT wo contrast: No results found for this or any previous visit.  Knee-R DG 4 views: Results for orders placed during the hospital encounter of 12/05/23  DG Knee Complete 4 Views Right  Narrative CLINICAL DATA:  Right knee pain  EXAM:  RIGHT KNEE - COMPLETE 4+ VIEW  COMPARISON:  None Available.  FINDINGS: Advanced osteoarthrosis with narrowing of the medial compartment of the knee and marginal osteophytes of the medial and lateral femoral condyle and tibial plateaus. Moderate femoropatellar osteoarthrosis.  IMPRESSION: Advanced osteoarthrosis.   Electronically Signed By: Shaaron Adler M.D. On: 12/05/2023 14:28  Knee-L DG 4 views: Results for orders placed during the hospital encounter of 12/05/23  DG Knee Complete 4 Views Left  Narrative CLINICAL DATA:  Left knee pain  EXAM: LEFT KNEE - COMPLETE 4+ VIEW  COMPARISON:  None Available.  FINDINGS: Advanced osteoarthrosis with narrowing of the medial compartment of the knee as well as marginal osteophytic changes of the lateral tibial plateau and femoral condyle with mild to moderate femoropatellar osteoarthrosis without significant joint effusion  IMPRESSION: Advanced osteoarthrosis of the left knee.   Electronically Signed By: Shaaron Adler M.D. On: 12/05/2023 14:28   Ankle Imaging: Ankle-R DG Complete: No results found for this or any previous visit.  Ankle-L DG Complete: No results found for this or any previous visit.   Foot Imaging: Foot-R DG Complete: No results found for this or any  previous visit.  Foot-L DG Complete: No results found for this or any previous visit.   Elbow Imaging: Elbow-R DG Complete: No results found for this or any previous visit.  Elbow-L DG Complete: No results found for this or any previous visit.   Wrist Imaging: Wrist-R DG Complete: No results found for this or any previous visit.  Wrist-L DG Complete: No results found for this or any previous visit.   Hand Imaging: Hand-R DG Complete: No results found for this or any previous visit.  Hand-L DG Complete: No results found for this or any previous visit.   Complexity Note: Imaging results reviewed.                         Meds   Current Outpatient Medications:    acyclovir (ZOVIRAX) 400 MG tablet, Take 1 tablet (400 mg total) by mouth 2 (two) times daily., Disp: 60 tablet, Rfl: 3   acyclovir (ZOVIRAX) 800 MG tablet, Take 1 tablet by mouth 2 (two) times daily., Disp: , Rfl:    clonazePAM (KLONOPIN) 0.5 MG tablet, Take by mouth., Disp: , Rfl:    DEXILANT 60 MG capsule, Take 1 capsule by mouth daily., Disp: , Rfl: 0   DULoxetine (CYMBALTA) 20 MG capsule, Take by mouth., Disp: , Rfl:    famotidine (PEPCID) 20 MG tablet, Take by mouth., Disp: , Rfl:    levocetirizine (XYZAL) 5 MG tablet, Take 1 tablet by mouth daily., Disp: , Rfl:    levothyroxine (SYNTHROID) 88 MCG tablet, Take 88 mcg by mouth daily., Disp: , Rfl: 3   Melatonin 10 MG CAPS, Take 1 capsule by mouth at bedtime., Disp: , Rfl:    mirtazapine (REMERON) 15 MG tablet, Take 1 tablet by mouth at bedtime., Disp: , Rfl:    montelukast (SINGULAIR) 10 MG tablet, Take 1 tablet by mouth daily., Disp: , Rfl:    oxyCODONE (OXY IR/ROXICODONE) 5 MG immediate release tablet, Take by mouth. Take 1 tablet (5 mg total) by mouth every 4 (four) hours as needed for Moderate pain (4-6) or Severe pain (7-10)., Disp: , Rfl:    oxyCODONE (OXY IR/ROXICODONE) 5 MG immediate release tablet, Take by mouth., Disp: , Rfl:     sulfamethoxazole-trimethoprim (BACTRIM DS) 800-160 MG tablet, Take by mouth., Disp: , Rfl:  Current Facility-Administered Medications:    acetaminophen (TYLENOL) tablet 650 mg, 650 mg, Oral, Once, Mosher, Kelli A, PA-C  ROS  Constitutional: Denies any fever or chills Gastrointestinal: No reported hemesis, hematochezia, vomiting, or acute GI distress Musculoskeletal: Denies any acute onset joint swelling, redness, loss of ROM, or weakness Neurological: No reported episodes of acute onset apraxia, aphasia, dysarthria, agnosia, amnesia, paralysis, loss of coordination, or loss of consciousness  Allergies  Kimberly Esparza is allergic to doxycycline and tape.  PFSH  Drug: Kimberly Esparza  reports no history of drug use. Alcohol:  reports current alcohol use. Tobacco:  reports that she has never smoked. She has never used smokeless tobacco. Medical:  has a past medical history of ADHD (attention deficit hyperactivity disorder), Anxiety, Depression, GERD (gastroesophageal reflux disease), Hypothyroidism, Multiple allergies, and Osteoarthritis. Surgical: Kimberly Esparza  has a past surgical history that includes Tonsillectomy; Sinoscopy; Tubal ligation; Partial hysterectomy; Knee surgery (Bilateral); Adenoidectomy; Wrist surgery (Bilateral); Bone marrow biopsy; and Chest tube insertion. Family: family history includes Allergic rhinitis in her brother and father; Congestive Heart Failure in her mother; Diabetes in her mother; Glaucoma in her mother; Gout in her brother and mother; Hypertension in her mother; Thyroid disease in her father.  Constitutional Exam  General appearance: Well nourished, well developed, and well hydrated. In no apparent acute distress There were no vitals filed for this visit. BMI Assessment: Estimated body mass index is 37.25 kg/m as calculated from the following:   Height as of 12/05/23: 5\' 4"  (1.626 m).   Weight as of 12/05/23: 217 lb (98.4 kg).  BMI interpretation table: BMI level  Category Range association with higher incidence of chronic pain  <18 kg/m2 Underweight   18.5-24.9 kg/m2 Ideal body weight   25-29.9 kg/m2 Overweight Increased incidence by 20%  30-34.9 kg/m2 Obese (Class I) Increased incidence by 68%  35-39.9 kg/m2 Severe obesity (Class II) Increased incidence by 136%  >40 kg/m2 Extreme obesity (Class III) Increased incidence by 254%   Patient's current BMI Ideal Body weight  There is no height or weight on file to calculate BMI. Ideal body weight: 54.7 kg (120 lb 9.5 oz) Adjusted ideal body weight: 72.2 kg (159 lb 2.5 oz)   BMI Readings from Last 4 Encounters:  12/05/23 37.25 kg/m  08/10/22 26.95 kg/m  08/08/22 27.94 kg/m  08/03/22 28.95 kg/m   Wt Readings from Last 4 Encounters:  12/05/23 217 lb (98.4 kg)  08/10/22 169 lb (76.7 kg)  08/08/22 175 lb 3.2 oz (79.5 kg)  08/03/22 176 lb 1.9 oz (79.9 kg)    Psych/Mental status: Alert, oriented x 3 (person, place, & time)       Eyes: PERLA Respiratory: No evidence of acute respiratory distress  Assessment & Plan  Primary Diagnosis & Pertinent Problem List: There were no encounter diagnoses. Visit Diagnosis: No diagnosis found. Problems updated and reviewed during this visit: No problems updated.  Plan of Care  Assessment and Plan             Pharmacotherapy (Medications Ordered): No orders of the defined types were placed in this encounter.  Procedure Orders    No procedure(s) ordered today   Lab Orders  No laboratory test(s) ordered today   Imaging Orders  No imaging studies ordered today   Referral Orders  No referral(s) requested today    Pharmacological management:  Opioid Analgesics: I will not be prescribing any opioids at this time Membrane stabilizer: I will not be prescribing any at this time Muscle relaxant:  I will not be prescribing any at this time NSAID: I will not be prescribing any at this time Other analgesic(s): I will not be prescribing any at this  time      Interventional Therapies  Risk Factors  Considerations  Medical Comorbidities:     Planned  Pending:      Under consideration:   Pending   Completed:   None at this time   Therapeutic  Palliative (PRN) options:   None established   Completed by other providers:   None reported       Provider-requested follow-up: No follow-ups on file. Recent Visits Date Type Provider Dept  12/05/23 Office Visit Renaldo Caroli, MD [facility]  Showing recent visits within past 90 days and meeting all other requirements Future Appointments Date Type Provider Dept  12/19/23 Appointment Renaldo Caroli, MD [facility]  Showing future appointments within next 90 days and meeting all other requirements   Primary Care Physician: Barney Boozer, NP  Duration of encounter: *** minutes.  Total time on encounter, as per AMA guidelines included both the face-to-face and non-face-to-face time personally spent by the physician and/or other qualified health care professional(s) on the day of the encounter (includes time in activities that require the physician or other qualified health care professional and does not include time in activities normally performed by clinical staff). Physician's time may include the following activities when performed: Preparing to see the patient (e.g., pre-charting review of records, searching for previously ordered imaging, lab work, and nerve conduction tests) Review of prior analgesic pharmacotherapies. Reviewing PMP Interpreting ordered tests (e.g., lab work, imaging, nerve conduction tests) Performing post-procedure evaluations, including interpretation of diagnostic procedures Obtaining and/or reviewing separately obtained history Performing a medically appropriate examination and/or evaluation Counseling and educating the patient/family/caregiver Ordering medications, tests, or procedures Referring and communicating with  other health care professionals (when not separately reported) Documenting clinical information in the electronic or other health record Independently interpreting results (not separately reported) and communicating results to the patient/ family/caregiver Care coordination (not separately reported)  Note by: Candi Chafe, MD (TTS technology used. I apologize for any typographical errors that were not detected and corrected.) Date: 12/19/2023; Time: 9:06 AM

## 2023-12-19 ENCOUNTER — Encounter: Payer: Self-pay | Admitting: Pain Medicine

## 2023-12-19 ENCOUNTER — Ambulatory Visit: Attending: Pain Medicine | Admitting: Pain Medicine

## 2023-12-19 VITALS — BP 123/93 | HR 109 | Temp 97.2°F | Resp 16 | Ht 64.0 in | Wt 220.0 lb

## 2023-12-19 DIAGNOSIS — G8929 Other chronic pain: Secondary | ICD-10-CM | POA: Diagnosis present

## 2023-12-19 DIAGNOSIS — M25561 Pain in right knee: Secondary | ICD-10-CM | POA: Diagnosis present

## 2023-12-19 DIAGNOSIS — M170 Bilateral primary osteoarthritis of knee: Secondary | ICD-10-CM | POA: Diagnosis present

## 2023-12-19 DIAGNOSIS — M25562 Pain in left knee: Secondary | ICD-10-CM | POA: Diagnosis present

## 2023-12-19 DIAGNOSIS — Z8739 Personal history of other diseases of the musculoskeletal system and connective tissue: Secondary | ICD-10-CM | POA: Diagnosis present

## 2023-12-19 DIAGNOSIS — G894 Chronic pain syndrome: Secondary | ICD-10-CM | POA: Insufficient documentation

## 2023-12-19 DIAGNOSIS — M545 Low back pain, unspecified: Secondary | ICD-10-CM | POA: Diagnosis present

## 2023-12-19 MED ORDER — JOURNAVX 50 MG PO TABS: 50.0000 mg | ORAL_TABLET | Freq: Two times a day (BID) | ORAL | 0 refills | Status: DC

## 2023-12-19 NOTE — Patient Instructions (Signed)

## 2023-12-20 ENCOUNTER — Encounter: Payer: Self-pay | Admitting: Oncology

## 2023-12-20 DIAGNOSIS — G620 Drug-induced polyneuropathy: Secondary | ICD-10-CM | POA: Insufficient documentation

## 2023-12-24 ENCOUNTER — Ambulatory Visit (HOSPITAL_BASED_OUTPATIENT_CLINIC_OR_DEPARTMENT_OTHER): Payer: Self-pay

## 2023-12-31 ENCOUNTER — Telehealth: Admitting: Behavioral Health

## 2024-01-01 NOTE — Progress Notes (Signed)
 Pt RS

## 2024-01-09 ENCOUNTER — Ambulatory Visit: Admitting: Behavioral Health

## 2024-01-09 ENCOUNTER — Encounter: Payer: Self-pay | Admitting: Behavioral Health

## 2024-01-09 DIAGNOSIS — F331 Major depressive disorder, recurrent, moderate: Secondary | ICD-10-CM | POA: Diagnosis not present

## 2024-01-09 DIAGNOSIS — R454 Irritability and anger: Secondary | ICD-10-CM

## 2024-01-09 DIAGNOSIS — F411 Generalized anxiety disorder: Secondary | ICD-10-CM | POA: Diagnosis not present

## 2024-01-09 DIAGNOSIS — R232 Flushing: Secondary | ICD-10-CM | POA: Diagnosis not present

## 2024-01-09 MED ORDER — DESVENLAFAXINE SUCCINATE ER 50 MG PO TB24: 50.0000 mg | ORAL_TABLET | Freq: Every day | ORAL | 1 refills | Status: DC

## 2024-01-09 NOTE — Progress Notes (Signed)
 Kimberly Esparza [id number] 15-Mar-1966 58 y.o.  Virtual Visit via Telephone Note  I connected with pt on 01/09/24 at 11:00 AM EDT by telephone and verified that I am speaking with the correct person using two identifiers.   I discussed the limitations, risks, security and privacy concerns of performing an evaluation and management service by telephone and the availability of in person appointments. I also discussed with the patient that there may be a patient responsible charge related to this service. The patient expressed understanding and agreed to proceed.   I discussed the assessment and treatment plan with the patient. The patient was provided an opportunity to ask questions and all were answered. The patient agreed with the plan and demonstrated an understanding of the instructions.   The patient was advised to call back or seek an in-person evaluation if the symptoms worsen or if the condition fails to improve as anticipated.  I provided 40 minutes of non-face-to-face time during this encounter.  The patient was located at home.  The provider was located at [facility].   Lincoln Renshaw, NP   Subjective:   Patient ID:  Kimberly Esparza is a 58 y.o. (DOB [birth year]) female.  Chief Complaint:  Chief Complaint  Patient presents with   Depression   Anxiety   Medication Refill   Follow-up   Medication Problem   Patient Education    HPI  Kimberly Esparza presents for follow-up and medication management.  She continues with with treatment for multiple myeloma. Has periods of depression. She is frustrated with continued hot flashes which she says is unbearable at time. She has stage 3 kidney failure and understands that we have to be very careful in managing psychiatric medications. Reduced Cymbalta  down to 20 mg last visit.  Would like to be able to stop Mirtazapine because of the weight gain. She says that her anxiety today is 3/10 and depression is 3/10.  She is sleeping  7-8 hours per night with aid of mirtazapine. She is sleeping 7-8 hours at night. No mania, no psychosis, No SI/HI.    Past Psychiatric Medication Failures as reported by PT   Abilify Lamictal Zoloft Paxil Lexapro Seroquel Celexa Effexor Wellbutrin  Adderall  Cymbalta       Review of Systems:  Review of Systems  Constitutional: Negative.   Allergic/Immunologic: Negative.   Neurological: Negative.   Psychiatric/Behavioral:  Positive for dysphoric mood. The patient is nervous/anxious.     Medications: I have reviewed the patient's current medications.  Current Outpatient Medications  Medication Sig Dispense Refill   desvenlafaxine (PRISTIQ) 50 MG 24 hr tablet Take 1 tablet (50 mg total) by mouth daily. 30 tablet 1   acyclovir  (ZOVIRAX ) 800 MG tablet Take 1 tablet by mouth 2 (two) times daily.     clonazePAM  (KLONOPIN ) 0.5 MG tablet Take by mouth.     DEXILANT 60 MG capsule Take 1 capsule by mouth daily.  0   DULoxetine  (CYMBALTA ) 20 MG capsule Take by mouth.     etodolac (LODINE XL) 500 MG 24 hr tablet Take 500 mg by mouth as needed.     famotidine (PEPCID) 20 MG tablet Take by mouth.     JOURNAVX 50 MG TABS Take 50 mg by mouth every 12 (twelve) hours. 60 tablet 0   levocetirizine (XYZAL ) 5 MG tablet Take 1 tablet by mouth daily.     levothyroxine (SYNTHROID) 88 MCG tablet Take 88 mcg by mouth daily.  3   Melatonin 10 MG  CAPS Take 1 capsule by mouth at bedtime.     mirtazapine (REMERON) 15 MG tablet Take 1 tablet by mouth at bedtime.     montelukast  (SINGULAIR ) 10 MG tablet Take 1 tablet by mouth daily.     morphine  (MSIR) 15 MG tablet Take 15 mg by mouth as needed for severe pain (pain score 7-10).     oxyCODONE (OXY IR/ROXICODONE) 5 MG immediate release tablet Take by mouth. Take 1 tablet (5 mg total) by mouth every 4 (four) hours as needed for Moderate pain (4-6) or Severe pain (7-10).     oxyCODONE (OXY IR/ROXICODONE) 5 MG immediate release tablet Take by mouth.      pregabalin (LYRICA) 75 MG capsule Take 75 mg by mouth daily.     sulfamethoxazole-trimethoprim (BACTRIM DS) 800-160 MG tablet Take by mouth.     Current Facility-Administered Medications  Medication Dose Route Frequency Provider Last Rate Last Admin   acetaminophen  (TYLENOL ) tablet 650 mg  650 mg Oral Once Mosher, Kelli A, PA-C        Medication Side Effects: None  Allergies:  Allergies  Allergen Reactions   Doxycycline Other (See Comments)    Other reaction(s): GI Upset (intolerance)   Tape Hives    Skin Irritation    Past Medical History:  Diagnosis Date   ADHD (attention deficit hyperactivity disorder)    Anxiety    Depression    GERD (gastroesophageal reflux disease)    Hypothyroidism    Multiple allergies    Osteoarthritis     Family History  Problem Relation Age of Onset   Hypertension Mother    Diabetes Mother    Glaucoma Mother    Gout Mother    Congestive Heart Failure Mother    Allergic rhinitis Father    Thyroid  disease Father    Allergic rhinitis Brother    Gout Brother     Social History   Socioeconomic History   Marital status: Married    Spouse name: Joanette Moynahan   Number of children: 2   Years of education: 14   Highest education level: Associate degree: academic program  Occupational History   Not on file  Tobacco Use   Smoking status: Never   Smokeless tobacco: Never  Vaping Use   Vaping status: Never Used  Substance and Sexual Activity   Alcohol use: Yes    Comment: rarely   Drug use: Never   Sexual activity: Not Currently    Partners: Male    Comment: Married but no interest  Other Topics Concern   Not on file  Social History Narrative   Not on file   Social Drivers of Health   Financial Resource Strain: Medium Risk (06/29/2023)   Received from [facility]    Overall Financial Resource Strain (CARDIA)    Difficulty of Paying Living Expenses: Somewhat hard  Food Insecurity: No Food Insecurity (09/20/2023)    Received from [facility]    Hunger Vital Sign    Worried About Running Out of Food in the Last Year: Never true    Ran Out of Food in the Last Year: Never true  Recent Concern: Food Insecurity - Food Insecurity Present (06/29/2023)   [facility]     Hunger Vital Sign    Worried About Running Out of Food in the Last Year: Never true    Ran Out of Food in the Last Year: Sometimes true  Transportation Needs: No Transportation Needs (09/20/2023)  Received from [facility]    [employer] - Administrator, Civil Service (Medical): No    Lack of Transportation (Non-Medical): No  Physical Activity: Unknown (06/29/2023)   Received from [facility]    Exercise Vital Sign    Days of Exercise per Week: 0 days    Minutes of Exercise per Session: Not on file  Stress: No Stress Concern Present (06/29/2023)   Received from [facility]    [employer] of Occupational Health - Occupational Stress Questionnaire    Feeling of Stress : Only a little  Social Connections: Socially Integrated (06/29/2023)   Received from [facility]    Social Connection and Isolation Panel [NHANES]    Frequency of Communication with Friends and Family: More than three times a week    Frequency of Social Gatherings with Friends and Family: Once a week    Attends Religious Services: More than 4 times per year    Active Member of [employer] or Organizations: Yes    Attends Engineer, structural: More than 4 times per year    Marital Status: Married  Catering manager Violence: Not At Risk (09/19/2023)   Received from [facility]    Abuse Screen    Feels Unsafe at Home or Work/School: no    Feels Threatened by Someone: no    Does Anyone Try to Keep You From Having Contact with Others or Doing Things Outside Your Home?: no     Physical Signs of Abuse Present: no    Past Medical History, Surgical history, Social history, and Family history were reviewed and updated as appropriate.   Please see review of systems for further details on the patient's review from today.   Objective:   Physical Exam:  There were no vitals taken for this visit.  Physical Exam Neurological:     Mental Status: She is alert and oriented to person, place, and time.  Psychiatric:        Attention and Perception: Attention and perception normal.        Mood and Affect: Mood normal.        Speech: Speech normal.        Behavior: Behavior normal. Behavior is cooperative.        Cognition and Memory: Cognition and memory normal.        Judgment: Judgment normal.     Comments: Insight intact     Lab Review:     Component Value Date/Time   NA 130 (L) 08/08/2022 1336   NA 134 (A) 07/26/2022 0000   NA 134 (A) 07/26/2022 0000   K 4.2 08/08/2022 1336   CL 101 08/08/2022 1336   CO2 16 (L) 08/08/2022 1336   GLUCOSE 101 (H) 08/08/2022 1336   BUN 34 (H) 08/08/2022 1336   BUN 27 (A) 07/26/2022 0000   BUN 27 (A) 07/26/2022 0000   CREATININE 3.13 (HH) 08/08/2022 1336   CALCIUM 7.4 (L) 08/08/2022 1336   PROT 11.4 (H) 08/08/2022 1336   ALBUMIN 1.6 (L) 08/08/2022 1336   AST 45 (H) 08/08/2022 1336   ALT 39 08/08/2022 1336   ALKPHOS 46 08/08/2022 1336   BILITOT 1.2 08/08/2022 1336   GFRNONAA 17 (L) 08/08/2022 1336       Component Value Date/Time   WBC 1.0 (L) 08/08/2022 1336   RBC 2.42 (L) 08/08/2022 1336   HGB 7.0 (L) 08/08/2022 1336   HCT 21.6 (L) 08/08/2022  1336   PLT 26 (L) 08/08/2022 1336   MCV 89.3 08/08/2022 1336   MCH 28.9 08/08/2022 1336   MCHC 32.4 08/08/2022 1336   RDW 18.8 (H) 08/08/2022 1336   LYMPHSABS 0.3 (L) 08/08/2022 1336   MONOABS 0.1 08/08/2022 1336   EOSABS 0.1 08/08/2022 1336   BASOSABS 0.0 08/08/2022 1336    No results found for: "POCLITH", "LITHIUM"   No results found for: "PHENYTOIN",  "PHENOBARB", "VALPROATE", "CBMZ"   .res Assessment: Plan:    Greater than 50% of 30  min face to face time with patient was spent on counseling and coordination of care. We discussed her current track and treatment with Multiple Myeloma and medications. Discussed her continued problems with breakthrough depression She stopped cymbalta  due to increased tremors at higher doses. She is only taking 20 mg per day now. We reviewed her medications and she had many question about non hormonal drugs that may help with bothersome hotflashes. Mirtazapine is causing continued weight gain and she would like to find something else that works. She understands that we are more limited due to her stage 3 kidney failure. We decided to trial of Pristiq which has been shown to be reasonably safe for kidneys at low doses.  We will continue with the low dose Cymbalta  as well. If improvement occurs with hotflashes, we may be able to reduce Mirtazapine. To continue  Cymbalta  to 20 mg daily.  To start Pristiq 50 mg daily in the am To continue mirtazapine 15 mg  at bedtime daily for now Will follow-up in 8 weeks to reassess Provided emergency contact information PDMP   Polly Brink A. Stepanie Graver, NP    Nikitha was seen today for depression, anxiety, medication refill, follow-up, medication problem and patient education.  Diagnoses and all orders for this visit:  Generalized anxiety disorder -     desvenlafaxine (PRISTIQ) 50 MG 24 hr tablet; Take 1 tablet (50 mg total) by mouth daily.  Major depressive disorder, recurrent episode, moderate (HCC) -     desvenlafaxine (PRISTIQ) 50 MG 24 hr tablet; Take 1 tablet (50 mg total) by mouth daily.  Anger reaction  Hot flashes -     desvenlafaxine (PRISTIQ) 50 MG 24 hr tablet; Take 1 tablet (50 mg total) by mouth daily.    Please see After Visit Summary for patient specific instructions.  Future Appointments  Date Time Provider Department Center  01/10/2024  9:20 AM Renaldo Caroli, MD [facility] None  02/13/2024 11:30 AM May, Deanna J, NP [facility]    No orders of the defined types were placed in this encounter.     -------------------------------

## 2024-01-10 ENCOUNTER — Ambulatory Visit: Attending: Pain Medicine | Admitting: Pain Medicine

## 2024-01-10 ENCOUNTER — Ambulatory Visit
Admission: RE | Admit: 2024-01-10 | Discharge: 2024-01-10 | Disposition: A | Discharge status: Home / Self Care | Source: Ambulatory Visit | Attending: Pain Medicine | Admitting: Pain Medicine

## 2024-01-10 ENCOUNTER — Encounter: Payer: Self-pay | Admitting: Pain Medicine

## 2024-01-10 VITALS — BP 116/73 | HR 122 | Temp 97.0°F | Resp 17 | Ht 64.0 in | Wt 230.0 lb

## 2024-01-10 DIAGNOSIS — M25562 Pain in left knee: Secondary | ICD-10-CM | POA: Insufficient documentation

## 2024-01-10 DIAGNOSIS — M25561 Pain in right knee: Secondary | ICD-10-CM | POA: Diagnosis not present

## 2024-01-10 DIAGNOSIS — Z5189 Encounter for other specified aftercare: Secondary | ICD-10-CM | POA: Insufficient documentation

## 2024-01-10 DIAGNOSIS — G8929 Other chronic pain: Secondary | ICD-10-CM | POA: Insufficient documentation

## 2024-01-10 DIAGNOSIS — M170 Bilateral primary osteoarthritis of knee: Secondary | ICD-10-CM | POA: Insufficient documentation

## 2024-01-10 MED ORDER — LIDOCAINE HCL 2 % IJ SOLN
20.0000 mL | Freq: Once | INTRAMUSCULAR | Status: AC
  Administered 2024-01-10: 400 mg

## 2024-01-10 MED ORDER — METHYLPREDNISOLONE ACETATE 40 MG/ML IJ SUSP: 40.0000 mg | Freq: Once | INTRAMUSCULAR | Status: DC

## 2024-01-10 MED ORDER — FENTANYL CITRATE (PF) 100 MCG/2ML IJ SOLN
INTRAMUSCULAR | Status: AC
Start: 2024-01-10 — End: ?
  Filled 2024-01-10: qty 2

## 2024-01-10 MED ORDER — METHYLPREDNISOLONE ACETATE 80 MG/ML IJ SUSP
80.0000 mg | Freq: Once | INTRAMUSCULAR | Status: AC
  Administered 2024-01-10: 80 mg

## 2024-01-10 MED ORDER — MIDAZOLAM HCL 5 MG/5ML IJ SOLN
INTRAMUSCULAR | Status: AC
Start: 2024-01-10 — End: ?
  Filled 2024-01-10: qty 5

## 2024-01-10 MED ORDER — LIDOCAINE HCL 2 % IJ SOLN
INTRAMUSCULAR | Status: AC
  Filled 2024-01-10: qty 20

## 2024-01-10 MED ORDER — METHYLPREDNISOLONE ACETATE 80 MG/ML IJ SUSP
INTRAMUSCULAR | Status: AC
  Filled 2024-01-10: qty 2

## 2024-01-10 MED ORDER — MIDAZOLAM HCL 5 MG/5ML IJ SOLN
0.5000 mg | Freq: Once | INTRAMUSCULAR | Status: AC
  Administered 2024-01-10: 2 mg via INTRAVENOUS

## 2024-01-10 MED ORDER — FENTANYL CITRATE (PF) 100 MCG/2ML IJ SOLN
25.0000 ug | INTRAMUSCULAR | Status: DC | PRN
Start: 2024-01-10 — End: 2024-01-10
  Administered 2024-01-10: 50 ug via INTRAVENOUS

## 2024-01-10 MED ORDER — ROPIVACAINE HCL 2 MG/ML IJ SOLN
9.0000 mL | Freq: Once | INTRAMUSCULAR | Status: AC
Start: 2024-01-10 — End: 2024-01-10
  Administered 2024-01-10: 9 mL via PERINEURAL

## 2024-01-10 MED ORDER — ROPIVACAINE HCL 2 MG/ML IJ SOLN
9.0000 mL | Freq: Once | INTRAMUSCULAR | Status: AC
Start: 2024-01-10 — End: 2024-01-10
  Administered 2024-01-10: 9 mL

## 2024-01-10 MED ORDER — PENTAFLUOROPROP-TETRAFLUOROETH EX AERO: INHALATION_SPRAY | Freq: Once | CUTANEOUS | Status: DC

## 2024-01-10 MED ORDER — ROPIVACAINE HCL 2 MG/ML IJ SOLN
INTRAMUSCULAR | Status: AC
  Filled 2024-01-10: qty 20

## 2024-01-10 NOTE — Patient Instructions (Addendum)
 Moderate Conscious Sedation, Adult, Care After After the procedure, it is common to have: Sleepiness for a few hours. Impaired judgment for a few hours. Trouble with balance. Nausea or vomiting if you eat too soon. Follow these instructions at home: For the time period you were told by your health care provider:  Rest. Do not participate in activities where you could fall or become injured. Do not drive or use machinery. Do not drink alcohol. Do not take sleeping pills or medicines that cause drowsiness. Do not make important decisions or sign legal documents. Do not take care of children on your own. Eating and drinking Follow instructions from your health care provider about what you may eat and drink. Drink enough fluid to keep your urine pale yellow. If you vomit: Drink clear fluids slowly and in small amounts as you are able. Clear fluids include water, ice chips, low-calorie sports drinks, and fruit juice that has water added to it (diluted fruit juice). Eat light and bland foods in small amounts as you are able. These foods include bananas, applesauce, rice, lean meats, toast, and crackers. General instructions Take over-the-counter and prescription medicines only as told by your health care provider. Have a responsible adult stay with you for the time you are told. Do not use any products that contain nicotine or tobacco. These products include cigarettes, chewing tobacco, and vaping devices, such as e-cigarettes. If you need help quitting, ask your health care provider. Return to your normal activities as told by your health care provider. Ask your health care provider what activities are safe for you. Your health care provider may give you more instructions. Make sure you know what you can and cannot do. Contact a health care provider if: You are still sleepy or having trouble with balance after 24 hours. You feel light-headed. You vomit every time you eat or drink. You get  a rash. You have a fever. You have redness or swelling around the IV site. Get help right away if: You have trouble breathing. You start to feel confused at home. These symptoms may be an emergency. Get help right away. Call 911. Do not wait to see if the symptoms will go away. Do not drive yourself to the hospital. This information is not intended to replace advice given to you by your health care provider. Make sure you discuss any questions you have with your health care provider. Document Revised: 03/06/2022 Document Reviewed: 03/06/2022 Elsevier Patient Education  2024 Elsevier Inc. ______________________________________________________________________    Post-Procedure Discharge Instructions  Instructions: Apply ice:  Purpose: This will minimize any swelling and discomfort after procedure.  When: Day of procedure, as soon as you get home. How: Fill a plastic sandwich bag with crushed ice. Cover it with a small towel and apply to injection site. How long: (15 min on, 15 min off) Apply for 15 minutes then remove x 15 minutes.  Repeat sequence on day of procedure, until you go to bed. Apply heat:  Purpose: To treat any soreness and discomfort from the procedure. When: Starting the next day after the procedure. How: Apply heat to procedure site starting the day following the procedure. How long: May continue to repeat daily, until discomfort goes away. Food intake: Start with clear liquids (like water) and advance to regular food, as tolerated.  Physical activities: Keep activities to a minimum for the first 8 hours after the procedure. After that, then as tolerated. Driving: If you have received any sedation, be responsible and do  not drive. You are not allowed to drive for 24 hours after having sedation. Blood thinner: (Applies only to those taking blood thinners) You may restart your blood thinner 6 hours after your procedure. Insulin: (Applies only to Diabetic patients taking  insulin) As soon as you can eat, you may resume your normal dosing schedule. Infection prevention: Keep procedure site clean and dry. Shower daily and clean area with soap and water. Post-procedure Pain Diary: Extremely important that this be done correctly and accurately. Recorded information will be used to determine the next step in treatment. For the purpose of accuracy, follow these rules: Evaluate only the area treated. Do not report or include pain from an untreated area. For the purpose of this evaluation, ignore all other areas of pain, except for the treated area. After your procedure, avoid taking a long nap and attempting to complete the pain diary after you wake up. Instead, set your alarm clock to go off every hour, on the hour, for the initial 8 hours after the procedure. Document the duration of the numbing medicine, and the relief you are getting from it. Do not go to sleep and attempt to complete it later. It will not be accurate. If you received sedation, it is likely that you were given a medication that may cause amnesia. Because of this, completing the diary at a later time may cause the information to be inaccurate. This information is needed to plan your care. Follow-up appointment: Keep your post-procedure follow-up evaluation appointment after the procedure (usually 2 weeks for most procedures, 6 weeks for radiofrequencies). DO NOT FORGET to bring you pain diary with you.   Expect: (What should I expect to see with my procedure?) From numbing medicine (AKA: Local Anesthetics): Numbness or decrease in pain. You may also experience some weakness, which if present, could last for the duration of the local anesthetic. Onset: Full effect within 15 minutes of injected. Duration: It will depend on the type of local anesthetic used. On the average, 1 to 8 hours.  From steroids (Applies only if steroids were used): Decrease in swelling or inflammation. Once inflammation is improved,  relief of the pain will follow. Onset of benefits: Depends on the amount of swelling present. The more swelling, the longer it will take for the benefits to be seen. In some cases, up to 10 days. Duration: Steroids will stay in the system x 2 weeks. Duration of benefits will depend on multiple posibilities including persistent irritating factors. Side-effects: If present, they may typically last 2 weeks (the duration of the steroids). Frequent: Cramps (if they occur, drink Gatorade and take over-the-counter Magnesium 450-500 mg once to twice a day); water retention with temporary weight gain; increases in blood sugar; decreased immune system response; increased appetite. Occasional: Facial flushing (red, warm cheeks); mood swings; menstrual changes. Uncommon: Long-term decrease or suppression of natural hormones; bone thinning. (These are more common with higher doses or more frequent use. This is why we prefer that our patients avoid having any injection therapies in other practices.)  Very Rare: Severe mood changes; psychosis; aseptic necrosis. From procedure: Some discomfort is to be expected once the numbing medicine wears off. This should be minimal if ice and heat are applied as instructed.  Call if: (When should I call?) You experience numbness and weakness that gets worse with time, as opposed to wearing off. New onset bowel or bladder incontinence. (Applies only to procedures done in the spine)  Emergency Numbers: Durning business hours (Monday -  Thursday, 8:00 AM - 4:00 PM) (Friday, 9:00 AM - 12:00 Noon): [phone] After hours: [phone] NOTE: If you are having a problem and are unable connect with, or to talk to a provider, then go to your nearest urgent care or emergency department. If the problem is serious and urgent, please call 911. ______________________________________________________________________

## 2024-01-10 NOTE — Progress Notes (Signed)
 Wasted 50mcg fentanyl and 2mg  versed per policy with c garner as witness.

## 2024-01-10 NOTE — Progress Notes (Signed)
 Safety precautions to be maintained throughout the outpatient stay will include: orient to surroundings, keep bed in low position, maintain call bell within reach at all times, provide assistance with transfer out of bed and ambulation.

## 2024-01-10 NOTE — Progress Notes (Signed)
 PROVIDER NOTE: Interpretation of information contained herein should be left to medically-trained personnel. Specific patient instructions are provided elsewhere under "Patient Instructions" section of medical record. This document was created in part using STT-dictation technology, any transcriptional errors that may result from this process are unintentional.  Patient: Kimberly Esparza Type: Established DOB: [birth year] MRN: [id number] PCP: Barney Boozer, NP  Service: Procedure DOS: 01/10/2024 Setting: Ambulatory Location: Ambulatory outpatient facility Delivery: Face-to-face Provider: Candi Chafe, MD Specialty: Interventional Pain Management Specialty designation: 09 Location: Outpatient facility Ref. Prov.: Potts, Lindsay Rho, NP       Interventional Therapy   Procedure:                Type: Genicular Nerves Block (Superolateral, Superomedial, and Inferomedial Genicular Nerves)  #1  Laterality: Bilateral (-50)  Level: Superior and inferior to the knee joint.  Imaging: Fluoroscopic guidance Anesthesia: Local anesthesia (1-2% Lidocaine) Anxiolysis: IV Versed         Sedation: Moderate Sedation Fentanyl         DOS: 01/10/2024  Performed by: Candi Chafe, MD  Purpose: Diagnostic/Therapeutic Indications: Chronic knee pain severe enough to impact quality of life or function. Rationale (medical necessity): procedure needed and proper for the diagnosis and/or treatment of Ms. Morones's medical symptoms and needs. 1. Chronic knee pain (1ry area of Pain) (Bilateral)   2. Primary osteoarthritis of knees (Bilateral)   3. Encounter for therapeutic procedure    NAS-11 Pain score:   Pre-procedure: 7 /10   Post-procedure: 4  (4/10 left, 7/10 left when standing, ice packs given)/10     Target: For Genicular Nerve block(s), the targets are: the superolateral genicular nerve, located in the lateral distal portion of the femoral shaft as it curves to form the lateral epicondyle, in the  region of the distal femoral metaphysis; the superomedial genicular nerve, located in the medial distal portion of the femoral shaft as it curves to form the medial epicondyle; and the inferomedial genicular nerve, located in the medial, proximal portion of the tibial shaft, as it curves to form the medial epicondyle, in the region of the proximal tibial metaphysis.  Location: Superolateral, Superomedial, and Inferomedial aspects of knee joint.  Region: Lateral, Anterior, and Medial aspects of the knee joint, above and below the knee joint proper. Approach: Percutaneous  Type of procedure: Percutaneous perineural nerve block. The genicular nerve block is a motor-sparing technique that anesthetizes the sensory terminal branches innervating the knee joint, resulting in anesthesia of the anterior compartment of the knee. The distribution of anesthesia of each nerve is mostly in the corresponding quadrant.  Neuroanatomy: The superolateral genicular nerve (SLGN) courses around the femur shaft to pass between the vastus lateralis and the lateral epicondyle. It accompanies the superior lateral genicular artery. The superomedial genicular nerve (SMGN) courses around the femur shaft, following the superior medial genicular artery, to pass between the adductor magnus tendon and the medial epicondyle below the vastus medialis. The inferolateral genicular nerve (ILGN) courses around the tibial lateral epicondyle deep to the lateral collateral ligament, following the inferior lateral genicular artery, superior of the fibula head. The inferomedial genicular nerve (IMGN) courses horizontally below the medial collateral ligament between the tibial medial epicondyle and the insertion of the collateral ligament. It accompanies the inferior medial genicular artery. The recurrent peroneal nerve originates in the inferior popliteal region from the common peroneal nerve and courses horizontally around the fibula to pass just  inferior of the fibula head and travel superior to  the anterolateral tibial epicondyle. It accompanies the recurrent tibial artery.  Position / Prep / Materials:  Position: Supine, Modified Fowler's position with pillows under the targeted knee(s). The patient is placed in a supine position with the knee slightly flexed by placing a pillow in the popliteal fossa. Prep solution: ChloraPrep (2% chlorhexidine gluconate and 70% isopropyl alcohol) Prep Area: Entire knee area, from mid-thigh to mid-shin, lateral, anterior, and medial aspects. Materials:  Tray: Block Needle(s):  Type: Spinal  Gauge (G): 22  Length: 3.5-in  Qty: 6  H&P (Pre-op Assessment):  Ms. Schreib is a 58 y.o. (year old), female patient, seen today for interventional treatment. She  has a past surgical history that includes Tonsillectomy; Sinoscopy; Tubal ligation; Partial hysterectomy; Knee surgery (Bilateral); Adenoidectomy; Wrist surgery (Bilateral); Bone marrow biopsy; and Chest tube insertion. Ms. Escott has a current medication list which includes the following prescription(s): acyclovir , clonazepam , desvenlafaxine, dexilant, duloxetine , etodolac, famotidine, journavx, levocetirizine, levothyroxine, melatonin, mirtazapine, montelukast , morphine , oxycodone, oxycodone, pregabalin, and sulfamethoxazole-trimethoprim, and the following Facility-Administered Medications: acetaminophen , fentanyl, methylprednisolone acetate, and pentafluoroprop-tetrafluoroeth. Her primarily concern today is the Knee Pain (bilateral)  Initial Vital Signs:  Pulse/HCG Rate: (!) 122ECG Heart Rate: 93 Temp: (!) 97.5 F (36.4 C) Resp: 18 BP: (!) 128/54 SpO2: 98 %  BMI: Estimated body mass index is 39.48 kg/m as calculated from the following:   Height as of this encounter: 5\' 4"  (1.626 m).   Weight as of this encounter: 230 lb (104.3 kg).  Risk Assessment: Allergies: Reviewed. She is allergic to doxycycline and tape.  Allergy  Precautions: None  required Coagulopathies: Reviewed. None identified.  Blood-thinner therapy: None at this time Active Infection(s): Reviewed. None identified. Ms. Armento is afebrile  Site Confirmation: Ms. Tercero was asked to confirm the procedure and laterality before marking the site Procedure checklist: Completed Consent: Before the procedure and under the influence of no sedative(s), amnesic(s), or anxiolytics, the patient was informed of the treatment options, risks and possible complications. To fulfill our ethical and legal obligations, as recommended by the American Medical Association's Code of Ethics, I have informed the patient of my clinical impression; the nature and purpose of the treatment or procedure; the risks, benefits, and possible complications of the intervention; the alternatives, including doing nothing; the risk(s) and benefit(s) of the alternative treatment(s) or procedure(s); and the risk(s) and benefit(s) of doing nothing. The patient was provided information about the general risks and possible complications associated with the procedure. These may include, but are not limited to: failure to achieve desired goals, infection, bleeding, organ or nerve damage, allergic reactions, paralysis, and death. In addition, the patient was informed of those risks and complications associated to the procedure, such as failure to decrease pain; infection; bleeding; organ or nerve damage with subsequent damage to sensory, motor, and/or autonomic systems, resulting in permanent pain, numbness, and/or weakness of one or several areas of the body; allergic reactions; (i.e.: anaphylactic reaction); and/or death. Furthermore, the patient was informed of those risks and complications associated with the medications. These include, but are not limited to: allergic reactions (i.e.: anaphylactic or anaphylactoid reaction(s)); adrenal axis suppression; blood sugar elevation that in diabetics may result in ketoacidosis  or comma; water retention that in patients with history of congestive heart failure may result in shortness of breath, pulmonary edema, and decompensation with resultant heart failure; weight gain; swelling or edema; medication-induced neural toxicity; particulate matter embolism and blood vessel occlusion with resultant organ, and/or nervous system infarction; and/or aseptic necrosis of one or more  joints. Finally, the patient was informed that Medicine is not an exact science; therefore, there is also the possibility of unforeseen or unpredictable risks and/or possible complications that may result in a catastrophic outcome. The patient indicated having understood very clearly. We have given the patient no guarantees and we have made no promises. Enough time was given to the patient to ask questions, all of which were answered to the patient's satisfaction. Ms. Knake has indicated that she wanted to continue with the procedure. Attestation: I, the ordering provider, attest that I have discussed with the patient the benefits, risks, side-effects, alternatives, likelihood of achieving goals, and potential problems during recovery for the procedure that I have provided informed consent. Date  Time: 01/10/2024  9:09 AM  Pre-Procedure Preparation:  Monitoring: As per clinic protocol. Respiration, ETCO2, SpO2, BP, heart rate and rhythm monitor placed and checked for adequate function Safety Precautions: Patient was assessed for positional comfort and pressure points before starting the procedure. Time-out: I initiated and conducted the "Time-out" before starting the procedure, as per protocol. The patient was asked to participate by confirming the accuracy of the "Time Out" information. Verification of the correct person, site, and procedure were performed and confirmed by me, the nursing staff, and the patient. "Time-out" conducted as per Joint Commission's Universal Protocol (UP.01.01.01). Time: 1006 Start  Time: 1006 hrs.  Description/Narrative of Procedure:          Rationale (medical necessity): procedure needed and proper for the diagnosis and/or treatment of the patient's medical symptoms and needs. Procedural Technique Safety Precautions: Aspiration looking for blood return was conducted prior to all injections. At no point did we inject any substances, as a needle was being advanced. No attempts were made at seeking any paresthesias. Safe injection practices and needle disposal techniques used. Medications properly checked for expiration dates. SDV (single dose vial) medications used. Description of the Procedure: Protocol guidelines were followed. The patient was assisted into a comfortable position. The target area was identified and the area prepped in the usual manner. Skin & deeper tissues infiltrated with local anesthetic. Appropriate amount of time allowed to pass for local anesthetics to take effect. The procedure needles were then advanced to the target area. Proper needle placement secured. Negative aspiration confirmed. Solution injected in intermittent fashion, asking for systemic symptoms every 0.5cc of injectate. The needles were then removed and the area cleansed, making sure to leave some of the prepping solution back to take advantage of its long term bactericidal properties.             Vitals:   01/10/24 1019 01/10/24 1024 01/10/24 1034 01/10/24 1044  BP: 112/65 110/70 112/72 116/73  Pulse:      Resp: 12  16 17   Temp:   (!) 97 F (36.1 C)   SpO2: 95% 98% 97% 98%  Weight:      Height:         Start Time: 1006 hrs. End Time: 1019 hrs.  Imaging Guidance (Non-Spinal):          Type of Imaging Technique: Fluoroscopy Guidance (Non-Spinal) Indication(s): Fluoroscopy guidance for needle placement to enhance accuracy in procedures requiring precise needle localization for targeted delivery of medication in or near specific anatomical locations not easily accessible without  such real-time imaging assistance. Exposure Time: Please see nurses notes. Contrast: None used. Fluoroscopic Guidance: I was personally present during the use of fluoroscopy. "Tunnel Vision Technique" used to obtain the best possible view of the target area. Parallax  error corrected before commencing the procedure. "Direction-depth-direction" technique used to introduce the needle under continuous pulsed fluoroscopy. Once target was reached, antero-posterior, oblique, and lateral fluoroscopic projection used confirm needle placement in all planes. Images permanently stored in EMR. Interpretation: No contrast injected. I personally interpreted the imaging intraoperatively. Adequate needle placement confirmed in multiple planes. Permanent images saved into the patient's record.  Post-operative Assessment:  Post-procedure Vital Signs:  Pulse/HCG Rate: (!) 12280 Temp: (!) 97 F (36.1 C) Resp: 17 BP: 116/73 SpO2: 98 %  EBL: None  Complications: No immediate post-treatment complications observed by team, or reported by patient.  Note: The patient tolerated the entire procedure well. A repeat set of vitals were taken after the procedure and the patient was kept under observation following institutional policy, for this type of procedure. Post-procedural neurological assessment was performed, showing return to baseline, prior to discharge. The patient was provided with post-procedure discharge instructions, including a section on how to identify potential problems. Should any problems arise concerning this procedure, the patient was given instructions to immediately contact us , at any time, without hesitation. In any case, we plan to contact the patient by telephone for a follow-up status report regarding this interventional procedure.  Comments:  No additional relevant information.  Plan of Care (POC)  Orders:  Orders Placed This Encounter  Procedures   GENICULAR NERVE BLOCK    Scheduling  Instructions:     Side(s): Bilateral Knee     Level(s): Superior-Lateral, Superior-Medial, and Inferior-Medial Genicular Nerves     Sedation: With Sedation.     Timeframe: Today    Where will this procedure be performed?:   [facility]   [facility] C-ARM 1-60 MIN NO REPORT    Intraoperative interpretation by procedural physician at [facility].    Standing Status:   Standing    Number of Occurrences:   1    Reason for exam::   Assistance in needle guidance and placement for procedures requiring needle placement in or near specific anatomical locations not easily accessible without such assistance.   Informed Consent Details: Physician/Practitioner Attestation; Transcribe to consent form and obtain patient signature    Note: Always confirm laterality of pain with Ms. Newhard, before procedure. Transcribe to consent form and obtain patient signature.    Physician/Practitioner attestation of informed consent for procedure/surgical case:   I, the physician/practitioner, attest that I have discussed with the patient the benefits, risks, side effects, alternatives, likelihood of achieving goals and potential problems during recovery for the procedure that I have provided informed consent.    Procedure:   Block of the genicular nerves of the knee (Genicular Nerves Block)    Physician/Practitioner performing the procedure:   Antara Brecheisen A. Daryus Sowash, MD    Indication/Reason:   Chronic knee pain   Provide equipment / supplies at bedside    Procedure tray: "Block Tray" (Disposable  single use) Skin infiltration needle: Regular 1.5-in, 25-G, (x1) Block Needle type: Spinal Amount/quantity: 6 Size: Regular (3.5-inch) Gauge: 22G    Standing Status:   Standing    Number of Occurrences:   1    Specify:   Block Tray   Saline lock IV    Have LR [phone] mL available and administer at 125 mL/hr if patient becomes hypotensive.    Standing Status:   Standing    Number of Occurrences:    1   Chronic Opioid Analgesic:   None MME/day: 0 mg/day    Medications ordered for procedure: Meds  ordered this encounter  Medications   lidocaine (XYLOCAINE) 2 % (with pres) injection 400 mg   pentafluoroprop-tetrafluoroeth (GEBAUERS) aerosol   midazolam (VERSED) 5 MG/5ML injection 0.5-2 mg    Make sure Flumazenil is available in the pyxis when using this medication. If oversedation occurs, administer 0.2 mg IV over 15 sec. If after 45 sec no response, administer 0.2 mg again over 1 min; may repeat at 1 min intervals; not to exceed 4 doses (1 mg)   fentaNYL (SUBLIMAZE) injection 25-50 mcg    Make sure Narcan is available in the pyxis when using this medication. In the event of respiratory depression (RR< 8/min): Titrate NARCAN (naloxone) in increments of 0.1 to 0.2 mg IV at 2-3 minute intervals, until desired degree of reversal.   ropivacaine (PF) 2 mg/mL (0.2%) (NAROPIN) injection 9 mL   methylPREDNISolone acetate (DEPO-MEDROL) injection 40 mg   methylPREDNISolone acetate (DEPO-MEDROL) injection 80 mg   ropivacaine (PF) 2 mg/mL (0.2%) (NAROPIN) injection 9 mL   Medications administered: We administered lidocaine, midazolam, fentaNYL, ropivacaine (PF) 2 mg/mL (0.2%), methylPREDNISolone acetate, and ropivacaine (PF) 2 mg/mL (0.2%).  See the medical record for exact dosing, route, and time of administration.  Follow-up plan:   Return in about 2 weeks (around 01/24/2024) for (Face2F), (PPE).       Interventional Therapies  Risk Factors  Considerations  Medical Comorbidities:     Planned  Pending:   Diagnostic bilateral genicular NB #1 (01/10/2024)  Journavx (Suzetrigene) 50 mg p.o. twice daily trial    Under consideration:   Diagnostic bilateral genicular NB #1  Possible bilateral genicular nerve RFA    Completed:   None at this time   Therapeutic  Palliative (PRN) options:   None established   Completed by other providers:   None reported       Recent Visits Date  Type Provider Dept  12/19/23 Office Visit Renaldo Caroli, MD [facility]  12/05/23 Office Visit Renaldo Caroli, MD [facility]  Showing recent visits within past 90 days and meeting all other requirements Today's Visits Date Type Provider Dept  01/10/24 Procedure visit Renaldo Caroli, MD [facility]  Showing today's visits and meeting all other requirements Future Appointments Date Type Provider Dept  01/24/24 Appointment Renaldo Caroli, MD [facility]  Showing future appointments within next 90 days and meeting all other requirements  Disposition: Discharge home  Discharge (Date  Time): 01/10/2024; 1045 hrs.   Primary Care Physician: Barney Boozer, NP Location: [facility] Note by: Candi Chafe, MD (TTS technology used. I apologize for any typographical errors that were not detected and corrected.) Date: 01/10/2024; Time: 11:00 AM  Disclaimer:  Medicine is not an Visual merchandiser. The only guarantee in medicine is that nothing is guaranteed. It is important to note that the decision to proceed with this intervention was based on the information collected from the patient. The Data and conclusions were drawn from the patient's questionnaire, the interview, and the physical examination. Because the information was provided in large part by the patient, it cannot be guaranteed that it has not been purposely or unconsciously manipulated. Every effort has been made to obtain as much relevant data as possible for this evaluation. It is important to note that the conclusions that lead to this procedure are derived in large part from the available data. Always take into account that the treatment will also be dependent on availability of resources and existing treatment guidelines, considered by  other Pain Management Practitioners as being common knowledge and practice, at the time of the intervention. For  Medico-Legal purposes, it is also important to point out that variation in procedural techniques and pharmacological choices are the acceptable norm. The indications, contraindications, technique, and results of the above procedure should only be interpreted and judged by a Board-Certified Interventional Pain Specialist with extensive familiarity and expertise in the same exact procedure and technique.

## 2024-01-11 ENCOUNTER — Telehealth: Payer: Self-pay

## 2024-01-11 NOTE — Telephone Encounter (Signed)
 No issues post-procedure.

## 2024-01-24 ENCOUNTER — Encounter: Payer: Self-pay | Admitting: Pain Medicine

## 2024-01-24 ENCOUNTER — Ambulatory Visit: Attending: Pain Medicine | Admitting: Pain Medicine

## 2024-01-24 VITALS — BP 132/73 | HR 104 | Temp 98.3°F | Resp 16 | Ht 64.0 in | Wt 223.0 lb

## 2024-01-24 DIAGNOSIS — M25562 Pain in left knee: Secondary | ICD-10-CM | POA: Diagnosis present

## 2024-01-24 DIAGNOSIS — Z09 Encounter for follow-up examination after completed treatment for conditions other than malignant neoplasm: Secondary | ICD-10-CM | POA: Diagnosis present

## 2024-01-24 DIAGNOSIS — M25561 Pain in right knee: Secondary | ICD-10-CM | POA: Insufficient documentation

## 2024-01-24 DIAGNOSIS — M545 Low back pain, unspecified: Secondary | ICD-10-CM | POA: Diagnosis present

## 2024-01-24 DIAGNOSIS — M542 Cervicalgia: Secondary | ICD-10-CM | POA: Insufficient documentation

## 2024-01-24 DIAGNOSIS — G8929 Other chronic pain: Secondary | ICD-10-CM | POA: Diagnosis present

## 2024-01-24 DIAGNOSIS — C9000 Multiple myeloma not having achieved remission: Secondary | ICD-10-CM | POA: Diagnosis present

## 2024-01-24 NOTE — Progress Notes (Signed)
 Safety precautions to be maintained throughout the outpatient stay will include: orient to surroundings, keep bed in low position, maintain call bell within reach at all times, provide assistance with transfer out of bed and ambulation.

## 2024-01-24 NOTE — Patient Instructions (Signed)
 ______________________________________________________________________    OTC Supplements:   The following is a list of over-the-counter (OTC) supplements that have been found to have [facility] [employer]) studies suggesting that they may be of some benefits when used in moderation in some chronic pain-related conditions.  NOTE:  Always consult with your primary care provider and/or pharmacist before taking any OTC medications to make sure they will not interact with your current medications. Always use manufacturer's recommended dosage.  Supplement Possible benefit May be of benefit in treatment of   Turmeric/curcumin anti-inflammatory Joint and muscle aches and pain.  Glucosamine/chondroitin (triple strength) may slow loss of articular cartilage Joint pain.  Vitamin D-3* may suppress release of chemicals associated with inflammation. Increases tolerance to pain. Joint and muscle aches and pain.   Moringa(+) anti-inflammatory with mild analgesic effects Joint and muscle aches and pain.  Melatonin(+) Helps reset sleep cycle. Insomnia.  Vitamin B-12* may help keep nerves and blood cells healthy as well as maintaining function of nervous system Nerve pain (Burning pain)  Alpha-Lipoic-Acid (ALA)* antioxidant that may help with nerve health, pain, and blocking the activation of some inflammatory chemicals Diabetic neuropathy and metabolic syndrome  superoxide dismutase (SOD)** Currently being reviewed.   Tiger Balm Currently being reviewed.   hydrolyzed collagen peptides* Currently being reviewed.  Collagen supplementation may increases bone strength, density, and mass; may improve joint stiffness/mobility, and functionality; and may reduce joint pain. Possible chondroprotective effects. May help with protection of joint health.   Methylsulfonylmethane (MSM)* Currently being reviewed.   CBD(+) Currently being reviewed.   Delta-8 THC(+) Currently being reviewed.   *  Generally  Recognized As Safe (GRAS) approved substance.-FDA ([employer]) ** "Possibly Safe", but not considered Generally Recognized As Safe (Not GRAS) by the [place] (FDA) as a food additive. (+) Not considered Generally Recognized As Safe (Not GRAS) by the [employer] (FDA) as a food additive.  ______________________________________________________________________     ______________________________________________________________________    TENS (Device can be purchased "online", without prescription. Search: "TENS 7000".) Transcutaneous electrical nerve stimulation (TENS) is a method of pain relief involving the use of a mild electrical current. A TENS machine is a small, battery-operated device that has leads connected to sticky pads called electrodes.   Rechargeable 9V batteries:     Electrode placement:   TENS UNIT SAFETY WARNING SHEET and INFORMATION INDICATIONS AND CONTRAINDICTIONS Read the operation manual before using the device. Freight forwarder ([place]) restricts this device to sale by or on the order of a physician. Observe your physician's precise instructions and let him show you where to apply the electrodes. For a successful therapy, the correct application of the electrodes is an important factor. Carefully write down the settings your physician recommended. Indications for use This device is a prescription device and only for symptomatic relief of chronic intractable pain. Contraindications:   Any electrode placement that applies current to the carotid sinus (neck) region.   Patients with implanted electronic devices (for example, a pacemaker) or metallic implants should not undertake.   Any electrode placement that causes current to flow transcerebrally (through the head). The use of unit whenever pain symptoms are undiagnosed, unit etiology is determined.    The use of TENS whenever pain syndromes are undiagnosed, until etiology is established.  WARNINGS AND PRECAUTIONS  Warnings:   The device must be kept out of reach of children.   The safety of device for use during pregnancy or delivery has not  been established.   Do not place electrodes on front of the throat. This may result in spasms of the laryngeal and pharyngeal muscles.   Do not place the electrodes over the carotid nerve (side of neck below ear).   The device is not effective for pain of central origin (headaches).   The device may interfere with electronic monitoring equipment (such as ECG monitors and ECG alarms).   Electrodes should not be placed over the eyes, in the mouth, or internally.   These devices have no curative value.   TENS devices should be used only under the continued supervision of a physician.   TENS is a symptomatic treatment and as such suppresses the sensation of pain which would otherwise serve as a protective mechanism. Precautions/Adverse Reactions   Isolated cases of skin irritation may occur at the site of electrode placement following long-term application.   Stimulation should be stopped and electrodes removed until the cause of the irritation can be determined.   Effectiveness is highly dependent upon patient selection by a person qualified in the management of pain patients.   If the device treatment becomes ineffective or unpleasant, stimulation should be discontinued until reevaluation by a physician/clinician.   Always turn the device off before applying or removing electrodes.   Skin irritation and electrode burns are potential adverse reactions.  PURPOSE: A Transcutaneous Electrical Nerve Stimulator, or TENS, unit is designed to relieve post-operative, acute and chronic pain. It is used for pain caused by peripheral nerves and not central. TENS units are prescription-only devices.  OPERATION: TENS units work in a couple of ways. The first way they are thought to work  is by a method called the [employer]. The [employer] states that our brains can only handle one stimulus at a time. When you have chronic pain, this pain signal is constantly being sent to your brain and recognized as pain. When an electrical stimulus is added to the area of pain the body feels this electrical stimulus, and since the brain can only handle one thing at a time, the pain is not transmitted to the brain. The second method thought to be part of TENS unit's success is by way of stimulating our own bodies to release their own natural painkillers. TENS units do not work for everyone and results may vary. Always follow the instructions and warnings in your user's manual.  USE: One of the most important tasks that must be performed is battery maintenance. If you are using a [employer], always fully charge it and fully deplete it before charging it again. These batteries can develop memories and by not performing this charging task correctly, your battery's life can be greatly diminished. If your battery does develop a memory you can help expand the memory by charging for 12 - 13 hours and then completely depleting the battery. Always prepare the skin before applying electrodes. Your skin should be clean and free of any lotions or creams. If you are using electrodes that use conductive gel, apply a small, even layer over the electrode. For carbon, self-adhesive electrodes, apply a drop of water to the electrodes before applying to the skin. The electrodes attach to the lead wires and then the TENS unit. Always grasp the connector and not the cord when inserting or removing. When making adjustments, always make sure the unit's channels (1 and 2) are in the OFF position. The actual settings should be recommended and prescribed by your physician. Medical equipment suppliers don'tset  or instruct users as to user settings. When you are using the BURST mode, the unit delivers a series of  quick pulses followed by a rest. This cycle repeats itself frequently. Always have channels OFF before changing modes.  For MODULATION mode, the stimulation automatically varies the width of the pulse.  For CONVENTIONAL mode, the stimulation is constant. After the settings have been fine-tuned, set the timer to 30 or 60 minutes. Your physician should also prescribe the use time. When the lights become dim, it means your batteries should be replaced or recharged.  ACCESSORIES: The electrodes and lead wires can be obtained from your medical equipment supplier. Your medical equipment supplier can set up a recurring delivery to accommodate your needs. Electrodes should be replaced once a month and lead wires once every 6 months.  Video Tutorial [email or web address]  ______________________________________________________________________

## 2024-01-24 NOTE — Progress Notes (Signed)
 PROVIDER NOTE: Interpretation of information contained herein should be left to medically-trained personnel. Specific patient instructions are provided elsewhere under "Patient Instructions" section of medical record. This document was created in part using AI and STT-dictation technology, any transcriptional errors that may result from this process are unintentional.  Patient: Kimberly Esparza  Service: E/M   PCP: Barney Boozer, NP  DOB: 01-17-1966  DOS: 01/24/2024  Provider: Candi Chafe, MD  MRN: [id number]  Delivery: Face-to-face  Specialty: Interventional Pain Management  Type: Established Patient  Setting: Ambulatory outpatient facility  Specialty designation: 09  Referring Prov.: Barney Boozer, NP  Location: Outpatient office facility       HPI  Ms. Kimberly Esparza, a 58 y.o. year old female, is here today because of her Bilateral chronic knee pain [M25.561, M25.562, G89.29]. Ms. Stormes primary complain today is Knee Pain  Pertinent problems: Ms. Guinther has Osteoarthritis; Synovitis of left hand; Closed fracture of left distal radius; Chronic knee pain (1ry area of Pain) (Bilateral); Multiple myeloma (HCC); Cancer related pain; At high risk for altered neurological function; Chemotherapy-induced neuropathy (HCC); History of fibromyalgia; Myoclonus; Multiple myeloma not having achieved remission (HCC); Chronic pain syndrome; Chronic low back pain (2ry area of Pain) (Midline) w/o sciatica; Cervicalgia; Primary osteoarthritis of knees (Bilateral); and Chemotherapy-induced peripheral neuropathy (HCC) on their pertinent problem list. Pain Assessment: Severity of Chronic pain is reported as a 4 /10. Location: Knee Right, Left, Anterior, Other (Comment) (Inner)/Denies. Onset: More than a month ago. Quality: Burning, Constant, Stabbing, Sharp. Timing: Constant. Modifying factor(s): Rest and ice. Vitals:  height is 5\' 4"  (1.626 m) and weight is 223 lb (101.2 kg). Her temporal temperature is 98.3 F  (36.8 C). Her blood pressure is 132/73 and her pulse is 104 (abnormal). Her respiration is 16 and oxygen saturation is 98%.  BMI: Estimated body mass index is 38.28 kg/m as calculated from the following:   Height as of this encounter: 5\' 4"  (1.626 m).   Weight as of this encounter: 223 lb (101.2 kg). Last encounter: 12/19/2023. Last procedure: 01/10/2024.  Reason for encounter: post-procedure evaluation and assessment.   Discussed the use of AI scribe software for clinical note transcription with the patient, who gave verbal consent to proceed.  History of Present Illness   Kimberly Esparza is a 57 year old female who presents for follow-up after bilateral diagnostic genicular nerve block for knee pain.  She underwent a bilateral diagnostic genicular nerve block to assess pain control in her knees. During the period when the numbing medication was effective, she experienced significant improvement in pain, with her left knee experiencing about 90% relief and her right knee about 70% relief. However, the pain began to return approximately ten days post-procedure, though it remained less severe than prior to the block.  The right knee took about three days to show improvement, eventually allowing her to engage in activities such as cooking. The pain in the right knee was primarily on the lateral aspect, which improved, but she continued to experience some pain in the medial area. The left knee showed faster and more significant improvement compared to the right.  She has a history of multiple myeloma and is currently in remission. She has started taking chondroitin and has noted that turmeric upsets her stomach due to esophageal issues. She is not currently taking any new medications for pain management.      Post-procedure evaluation  Type: Genicular Nerves Block (Superolateral, Superomedial, and Inferomedial Genicular Nerves)  #  1  Laterality: Bilateral (-50)  Level: Superior and inferior to the  knee joint.  Imaging: Fluoroscopic guidance Anesthesia: Local anesthesia (1-2% Lidocaine ) Anxiolysis: IV Versed          Sedation: Moderate Sedation Fentanyl          DOS: 01/10/2024  Performed by: Candi Chafe, MD   Purpose: Diagnostic/Therapeutic Indications: Chronic knee pain severe enough to impact quality of life or function. Rationale (medical necessity): procedure needed and proper for the diagnosis and/or treatment of Ms. Dellarocco's medical symptoms and needs. 1. Chronic knee pain (1ry area of Pain) (Bilateral)   2. Primary osteoarthritis of knees (Bilateral)   3. Encounter for therapeutic procedure     NAS-11 Pain score:        Pre-procedure: 7 /10        Post-procedure: 4  (4/10 left, 7/10 left when standing, ice packs given)/10     Effectiveness:  Initial hour after procedure: 50 % (10% releif on right knee and 50%relief on left knee). Subsequent 4-6 hours post-procedure: 50 % (10% releif on right knee and 50%relief on left knee). Analgesia past initial 6 hours: 50 % (90% relief for 10 days on left knee and 70% relief on right knee for 10 days and gradually returned to now where it is 50% on left knee and 40% releif on right knee. Now can stand a bit longer and a bit more active). Ongoing improvement:  Analgesic: The patient indicates having attained only 10% improvement of the pain in the right knee and 50% improvement of the pain on the left knee for the duration of the local anesthetic.  I have explained to the patient that based on those results I would probably not move onto a radiofrequency ablation.  I question her today about the accuracy of those reports since she did not bring with her the pain diary.  She insists that it is accurate and that the information was recorded every hour on the hour for the initial 6 to 8 hours after the procedure.  Although she describes having eventually obtained a 90% relief of the pain in the left knee and 70% relief of the pain on the  right knee, this occurred days after the procedure suggesting that it was secondary to the anti-inflammatory effects of the steroids.  I have also explained to the patient that because the steroids can provide benefit and areas distant to where they are being injected, the only thing that that they tell us  is whether or not there is inflammation involved in sustaining the pain.  Based on the above results it would seem that there is an inflammatory component to it.  Unfortunately that also did not last long and the patient indicates currently having an ongoing 50% relief of the left knee pain and only an ongoing 40% relief of the right knee pain. Function: Ms. Barbuto reports improvement in function ROM: Ms. Papin reports improvement in ROM   Pharmacotherapy Assessment  Analgesic: None MME/day: 0 mg/day   Monitoring: [place] PMP: PDMP not reviewed this encounter.       Pharmacotherapy: No side-effects or adverse reactions reported. Compliance: No problems identified. Effectiveness: Clinically acceptable.  Huston Maiers, RN  01/24/2024  2:50 PM  Sign when Signing Visit Safety precautions to be maintained throughout the outpatient stay will include: orient to surroundings, keep bed in low position, maintain call bell within reach at all times, provide assistance with transfer out of bed and ambulation.     No results  found for: "CBDTHCR" No results found for: "D8THCCBX" No results found for: "D9THCCBX" UDS:  Summary  Date Value Ref Range Status  12/05/2023 FINAL  Final    Comment:    ==================================================================== Compliance Drug Analysis, Ur ==================================================================== Specimen Alert Not Detected result may be consistent with the time of last use noted for this medication. AS NEEDED (Oxycodone) ==================================================================== Test                             Result       Flag        Units  Drug Present and Declared for Prescription Verification   Duloxetine                      PRESENT      EXPECTED   Mirtazapine                    PRESENT      EXPECTED  Drug Present not Declared for Prescription Verification   Acetaminophen                   PRESENT      UNEXPECTED   Diphenhydramine                 PRESENT      UNEXPECTED  Drug Absent but Declared for Prescription Verification   Clonazepam                      Not Detected UNEXPECTED ng/mg creat   Oxycodone                      Not Detected UNEXPECTED ng/mg creat ==================================================================== Test                      Result    Flag   Units      Ref Range   Creatinine              20               mg/dL      >=16 ==================================================================== Declared Medications:  The flagging and interpretation on this report are based on the  following declared medications.  Unexpected results may arise from  inaccuracies in the declared medications.   **Note: The testing scope of this panel includes these medications:   Clonazepam  (Klonopin )  Duloxetine  (Cymbalta )  Mirtazapine (Remeron)  Oxycodone (Roxicodone)   **Note: The testing scope of this panel does not include the  following reported medications:   Acyclovir  (Zovirax )  Dexlansoprazole (Dexilant)  Famotidine (Pepcid)  Levocetirizine (Xyzal )  Levothyroxine (Synthroid)  Melatonin  Montelukast  (Singulair )  Sulfamethoxazole (Bactrim)  Trimethoprim (Bactrim) ==================================================================== For clinical consultation, please call [phone]. ====================================================================     ROS  Constitutional: Denies any fever or chills Gastrointestinal: No reported hemesis, hematochezia, vomiting, or acute GI distress Musculoskeletal: Denies any acute onset joint swelling, redness, loss of ROM, or  weakness Neurological: No reported episodes of acute onset apraxia, aphasia, dysarthria, agnosia, amnesia, paralysis, loss of coordination, or loss of consciousness  Medication Review  DULoxetine , Melatonin, Suzetrigine, acyclovir , clonazePAM , desvenlafaxine , dexlansoprazole, etodolac, famotidine, levocetirizine, levothyroxine, mirtazapine, montelukast , morphine , oxyCODONE, pregabalin, and sulfamethoxazole-trimethoprim  History Review  Allergy : Ms. Pesantez is allergic to doxycycline and tape. Drug: Ms. Placide  reports no history of drug use. Alcohol:  reports current alcohol use. Tobacco:  reports that she has never smoked. She  has never used smokeless tobacco. Social: Ms. Mausolf  reports that she has never smoked. She has never used smokeless tobacco. She reports current alcohol use. She reports that she does not use drugs. Medical:  has a past medical history of ADHD (attention deficit hyperactivity disorder), Anxiety, Depression, GERD (gastroesophageal reflux disease), Hypothyroidism, Multiple allergies, and Osteoarthritis. Surgical: Ms. Burgoon  has a past surgical history that includes Tonsillectomy; Sinoscopy; Tubal ligation; Partial hysterectomy; Knee surgery (Bilateral); Adenoidectomy; Wrist surgery (Bilateral); Bone marrow biopsy; and Chest tube insertion. Family: family history includes Allergic rhinitis in her brother and father; Congestive Heart Failure in her mother; Diabetes in her mother; Glaucoma in her mother; Gout in her brother and mother; Hypertension in her mother; Thyroid  disease in her father.  Laboratory Chemistry Profile   Renal Lab Results  Component Value Date   BUN 34 (H) 08/08/2022   CREATININE 3.13 (HH) 08/08/2022   GFRNONAA 17 (L) 08/08/2022    Hepatic Lab Results  Component Value Date   AST 45 (H) 08/08/2022   ALT 39 08/08/2022   ALBUMIN 1.6 (L) 08/08/2022   ALKPHOS 46 08/08/2022    Electrolytes Lab Results  Component Value Date   NA 130 (L) 08/08/2022    K 4.2 08/08/2022   CL 101 08/08/2022   CALCIUM 7.4 (L) 08/08/2022   MG 2.0 12/05/2023    Bone Lab Results  Component Value Date   25OHVITD1 42 12/05/2023   25OHVITD2 <1.0 12/05/2023   25OHVITD3 41 12/05/2023    Inflammation (CRP: Acute Phase) (ESR: Chronic Phase) Lab Results  Component Value Date   ESRSEDRATE 25 12/05/2023         Note: Above Lab results reviewed.  Recent Imaging Review  DG PAIN CLINIC C-ARM 1-60 MIN NO REPORT Fluoro was used, but no Radiologist interpretation will be provided.  Please refer to "NOTES" tab for provider progress note. Note: Reviewed         Physical Exam  General appearance: Well nourished, well developed, and well hydrated. In no apparent acute distress Mental status: Alert, oriented x 3 (person, place, & time)       Respiratory: No evidence of acute respiratory distress Eyes: PERLA Vitals: BP 132/73   Pulse (!) 104   Temp 98.3 F (36.8 C) (Temporal)   Resp 16   Ht 5\' 4"  (1.626 m)   Wt 223 lb (101.2 kg)   SpO2 98%   BMI 38.28 kg/m  BMI: Estimated body mass index is 38.28 kg/m as calculated from the following:   Height as of this encounter: 5\' 4"  (1.626 m).   Weight as of this encounter: 223 lb (101.2 kg). Ideal: Ideal body weight: 54.7 kg (120 lb 9.5 oz) Adjusted ideal body weight: 73.3 kg (161 lb 8.9 oz)  Assessment   Diagnosis Status  1. Chronic knee pain (1ry area of Pain) (Bilateral)   2. Chronic low back pain (2ry area of Pain) (Midline) w/o sciatica   3. Cervicalgia   4. Multiple myeloma not having achieved remission (HCC)   5. Postop check    Controlled Controlled Controlled   Updated Problems: No problems updated.  Plan of Care  Problem-specific:  Assessment and Plan    Knee pain due to inflammation   She experiences bilateral knee pain, with the right knee more affected. A previous bilateral diagnostic genicular nerve block provided 70-80% improvement in the right knee and 90% in the left knee, but pain  returned after 10 days, indicating temporary relief. Approximately 50% of the  pain is due to inflammation and swelling, with the remainder possibly originating from the back. Local anesthetic provided temporary relief, suggesting a partial knee origin. Radiofrequency ablation would offer similar relief. A spinal cord stimulator could address both knee and back pain but requires a trial and is costly. She aims to manage pain to facilitate weight loss and qualify for knee replacement surgery. Consider a spinal cord stimulator trial after medical psychology evaluation. Explore the use of a TENS unit for pain management. Continue glucosamine and chondroitin supplementation. Avoid turmeric due to gastrointestinal upset. Discuss pain management options with palliative care.  Multiple myeloma   Multiple myeloma is in remission. She reports non-radiating back pain that began post-diagnosis. Discussed potential for a spinal cord stimulator to address both back and knee pain.  Gastroesophageal reflux disease (GERD)   She experiences gastrointestinal upset with certain substances, including turmeric, due to GERD. Advised against turmeric use.       Ms. SAMAIA IWATA has a current medication list which includes the following long-term medication(s): desvenlafaxine , dexilant, duloxetine , journavx, and levothyroxine.  Pharmacotherapy (Medications Ordered): No orders of the defined types were placed in this encounter.  Orders:  Orders Placed This Encounter  Procedures   Nursing Instructions:    Please complete this patient's postprocedure evaluation.    Scheduling Instructions:     Please complete this patient's postprocedure evaluation.   Follow-up plan:   Return if symptoms worsen or fail to improve.     Interventional Therapies  Risk Factors  Considerations  Medical Comorbidities:     Planned  Pending:   Diagnostic bilateral genicular NB #1 (01/10/2024)  Journavx (Suzetrigene) 50 mg p.o. twice  daily trial    Under consideration:   Diagnostic bilateral genicular NB #1  Possible bilateral genicular nerve RFA    Completed:   None at this time   Therapeutic  Palliative (PRN) options:   None established   Completed by other providers:   None reported      Recent Visits Date Type Provider Dept  01/10/24 Procedure visit Renaldo Caroli, MD [facility]  12/19/23 Office Visit Renaldo Caroli, MD [facility]  12/05/23 Office Visit Renaldo Caroli, MD [facility]  Showing recent visits within past 90 days and meeting all other requirements Today's Visits Date Type Provider Dept  01/24/24 Office Visit Renaldo Caroli, MD [facility]  Showing today's visits and meeting all other requirements Future Appointments No visits were found meeting these conditions. Showing future appointments within next 90 days and meeting all other requirements   I discussed the assessment and treatment plan with the patient. The patient was provided an opportunity to ask questions and all were answered. The patient agreed with the plan and demonstrated an understanding of the instructions.  Patient advised to call back or seek an in-person evaluation if the symptoms or condition worsens.  Duration of encounter: 35 minutes.  Total time on encounter, as per AMA guidelines included both the face-to-face and non-face-to-face time personally spent by the physician and/or other qualified health care professional(s) on the day of the encounter (includes time in activities that require the physician or other qualified health care professional and does not include time in activities normally performed by clinical staff). Physician's time may include the following activities when performed: Preparing to see the patient (e.g., pre-charting review of records, searching for previously ordered imaging, lab work, and nerve conduction tests) Review of prior  analgesic pharmacotherapies. Reviewing PMP Interpreting ordered tests (e.g.,  lab work, imaging, nerve conduction tests) Performing post-procedure evaluations, including interpretation of diagnostic procedures Obtaining and/or reviewing separately obtained history Performing a medically appropriate examination and/or evaluation Counseling and educating the patient/family/caregiver Ordering medications, tests, or procedures Referring and communicating with other health care professionals (when not separately reported) Documenting clinical information in the electronic or other health record Independently interpreting results (not separately reported) and communicating results to the patient/ family/caregiver Care coordination (not separately reported)  Note by: Candi Chafe, MD (TTS and AI technology used. I apologize for any typographical errors that were not detected and corrected.) Date: 01/24/2024; Time: 3:52 PM

## 2024-02-01 ENCOUNTER — Encounter: Payer: Self-pay | Admitting: Oncology

## 2024-02-01 ENCOUNTER — Encounter (HOSPITAL_BASED_OUTPATIENT_CLINIC_OR_DEPARTMENT_OTHER): Payer: Self-pay

## 2024-02-01 ENCOUNTER — Ambulatory Visit (HOSPITAL_BASED_OUTPATIENT_CLINIC_OR_DEPARTMENT_OTHER)
Admission: RE | Admit: 2024-02-01 | Discharge: 2024-02-01 | Disposition: A | Discharge status: Home / Self Care | Source: Ambulatory Visit | Attending: Family Medicine | Admitting: Family Medicine

## 2024-02-01 VITALS — BP 138/92 | HR 103 | Temp 98.1°F | Resp 18

## 2024-02-01 DIAGNOSIS — R051 Acute cough: Secondary | ICD-10-CM | POA: Insufficient documentation

## 2024-02-01 DIAGNOSIS — R350 Frequency of micturition: Secondary | ICD-10-CM | POA: Diagnosis present

## 2024-02-01 DIAGNOSIS — C9001 Multiple myeloma in remission: Secondary | ICD-10-CM | POA: Insufficient documentation

## 2024-02-01 DIAGNOSIS — J069 Acute upper respiratory infection, unspecified: Secondary | ICD-10-CM | POA: Insufficient documentation

## 2024-02-01 HISTORY — DX: Multiple myeloma not having achieved remission: C90.00

## 2024-02-01 HISTORY — DX: Malignant (primary) neoplasm, unspecified: C80.1

## 2024-02-01 LAB — RESPIRATORY PANEL BY PCR

## 2024-02-01 LAB — POCT URINALYSIS DIP (MANUAL ENTRY)
Bilirubin, UA: NEGATIVE
Glucose, UA: NEGATIVE mg/dL
Ketones, POC UA: NEGATIVE mg/dL
Leukocytes, UA: NEGATIVE
Nitrite, UA: NEGATIVE
Protein Ur, POC: NEGATIVE mg/dL
Spec Grav, UA: <=1.005 — AB
Urobilinogen, UA: 0.2 U/dL
pH, UA: 5.5

## 2024-02-01 NOTE — Discharge Instructions (Addendum)
 Exam is essentially normal with some nasal congestion.  Chest sounds clear.  Will get a respiratory PCR panel, CBC with differential and a CMP.  Will adjust the plan of care, once labs are available.  Get plenty of fluids and rest.  Patient has OTC decongestant cough syrup at home.  Follow-up if symptoms do not improve, worsen or new symptoms occur.

## 2024-02-01 NOTE — ED Triage Notes (Signed)
 Reports for about week had cough, congestion, post nasal drainage. Reports having urinary urgency for about week as well. Has little pain when holding bladder, but denies dysuria.  Reports gets her cancer treatment at [facility] for multiple myeloma, currently in remission.

## 2024-02-01 NOTE — ED Provider Notes (Signed)
 [facility]    CSN: [id number] Arrival date & time: 02/01/24  1028      History   Chief Complaint Chief Complaint  Patient presents with   Cough   Nasal Congestion    HPI Kimberly Esparza is a 58 y.o. female.   58 year old female with high-risk IgA kappa multiple myeloma, now s/p Carvykti (on 09/12/23 at [facility]).  She was just recently told that she is in remission for the Multiple Myeloma.  She has had cough, head congestion, postnasal drainage since approximately 01/24/2024 or 01/25/2024.  She is very fatigued and weak as her symptoms have persisted.  She is concerned that she has a viral upper respiratory infection.  Her oncologist has requested when she has the symptoms, due to her immunosuppressive state, that she get the respiratory panel PCR to validate what is going on and what might be causing her symptoms.  So she came to urgent care.  She denies any fevers.  Her congestion is in her head not her chest.  She has noted urinary urgency and frequency intermittently since the other symptoms started.  She denies any burning with urination.  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   Primary osteoarthritis of knees (Bilateral) 12/19/2023   Immunization due 12/17/2023   Chronic low back pain (2ry area of Pain) (Midline) w/o sciatica 12/05/2023   Cervicalgia 12/05/2023   Chronic pain syndrome 12/03/2023    Pharmacologic therapy 12/03/2023   Disorder of skeletal system 12/03/2023   Problems influencing health status 12/03/2023   Pancytopenia due to chemotherapy (HCC) 10/05/2023   History of fibromyalgia 10/02/2023   COVID 09/25/2023   Allergies 09/19/2023   Cytokine release syndrome, grade 1 09/19/2023   S/P chimeric antigen receptor T-cell therapy 09/14/2023   At high risk for altered neurological function 09/07/2023   Chemotherapy-induced neuropathy (HCC) 09/07/2023   Anxiety and depression 09/07/2023   Need for pneumocystis prophylaxis 08/31/2023   Sphenoid mass 04/02/2023   Hypomagnesemia 01/09/2023   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   Hypersensitivity 10/05/2022   Nausea & vomiting 09/27/2022   Hyperkalemia 09/20/2022   Pneumonia 09/20/2022   Hyperuricemia 08/20/2022   Influenza 08/20/2022   Metabolic acidosis 08/20/2022   Moderate protein-calorie malnutrition (HCC) 08/20/2022   Obesity, Class II, BMI 35-39.9 08/20/2022   Hyponatremia 08/19/2022   Coordination of complex care 08/01/2022   Delirium due to multiple etiologies 07/31/2022   Hyperviscosity 07/20/2022   Physical deconditioning 07/20/2022   Chemotherapy follow-up examination 07/13/2022   Hemothorax on right 07/02/2022   Multiple myeloma not having achieved remission (HCC) 07/02/2022   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   Thrombocytopenia (HCC) 06/23/2022   AKI (acute kidney injury) (HCC) 06/23/2022   Cancer related pain 06/23/2022   Multiple myeloma (HCC) 06/20/2022   Hypercalcemia of malignancy 06/20/2022  Osteoarthritis    GERD (gastroesophageal reflux disease)    Depression    Synovitis of left hand 12/10/2019   Closed fracture of left distal radius 09/09/2019   Hashimoto's thyroiditis 12/09/2018   Chronic knee pain (1ry area of Pain) (Bilateral) 10/24/2018   Adult hypothyroidism 09/08/2017   Gastroenteritis 09/08/2017   Dizziness 09/08/2017   Aphasia  07/25/2013   General symptom 07/25/2013   Malaise and fatigue 07/25/2013   Myoclonus 07/25/2013    Past Surgical History:  Procedure Laterality Date   ADENOIDECTOMY     BONE MARROW BIOPSY     CHEST TUBE INSERTION     Taken out after 3 days   KNEE SURGERY Bilateral    PARTIAL HYSTERECTOMY     SINOSCOPY     TONSILLECTOMY     TUBAL LIGATION     WRIST SURGERY Bilateral     OB History   No obstetric history on file.      Home Medications    Prior to Admission medications   Medication Sig Start Date End Date Taking? Authorizing Provider  acyclovir  (ZOVIRAX ) 800 MG tablet Take 1 tablet by mouth 2 (two) times daily. 10/09/23   [provider]  clonazePAM  (KLONOPIN ) 0.5 MG tablet Take by mouth. 12/26/17   [provider]  desvenlafaxine  (PRISTIQ ) 50 MG 24 hr tablet Take 1 tablet (50 mg total) by mouth daily. 01/09/24   White, Mora Apple, NP  DEXILANT 60 MG capsule Take 1 capsule by mouth daily. 08/14/16   [provider]  DULoxetine  (CYMBALTA ) 20 MG capsule Take by mouth.    [provider]  etodolac (LODINE XL) 500 MG 24 hr tablet Take 500 mg by mouth as needed.    [provider]  famotidine (PEPCID) 20 MG tablet Take by mouth. 02/23/23   [provider]  JOURNAVX 50 MG TABS Take 50 mg by mouth every 12 (twelve) hours. 12/19/23 01/24/24  Renaldo Caroli, MD  levocetirizine (XYZAL ) 5 MG tablet Take 1 tablet by mouth daily. 02/12/17   [provider]  levothyroxine (SYNTHROID) 88 MCG tablet Take 88 mcg by mouth daily. 08/14/16   [provider]  Melatonin 10 MG CAPS Take 1 capsule by mouth at bedtime.    [provider]  mirtazapine (REMERON) 15 MG tablet Take 1 tablet by mouth at bedtime. 04/25/23   [provider]  montelukast  (SINGULAIR ) 10 MG tablet Take 1 tablet by mouth daily.    [provider]  morphine  (MSIR) 15 MG tablet Take 15 mg by mouth as needed for severe pain (pain score 7-10).     [provider]  oxyCODONE (OXY IR/ROXICODONE) 5 MG immediate release tablet Take by mouth. Take 1 tablet (5 mg total) by mouth every 4 (four) hours as needed for Moderate pain (4-6) or Severe pain (7-10). 06/19/22   [provider]  oxyCODONE (OXY IR/ROXICODONE) 5 MG immediate release tablet Take by mouth. 06/19/22   [provider]  pregabalin (LYRICA) 75 MG capsule Take 75 mg by mouth 2 (two) times daily.    [provider]  sulfamethoxazole-trimethoprim (BACTRIM DS) 800-160 MG tablet Take by mouth. 11/29/22   [provider]    Family History Family History  Problem Relation Age of Onset   Hypertension Mother    Diabetes Mother    Glaucoma Mother    Gout Mother    Congestive Heart Failure Mother    Allergic rhinitis Father    Thyroid  disease Father  Allergic rhinitis Brother    Gout Brother     Social History Social History   Tobacco Use   Smoking status: Never   Smokeless tobacco: Never  Vaping Use   Vaping status: Never Used  Substance Use Topics   Alcohol use: Yes    Comment: rarely   Drug use: Never     Allergies   Doxycycline and Tape   Review of Systems Review of Systems  Constitutional:  Positive for fatigue. Negative for chills and fever.  HENT:  Positive for congestion, postnasal drip and rhinorrhea. Negative for ear pain and sore throat.   Eyes:  Negative for pain and visual disturbance.  Respiratory:  Positive for cough. Negative for shortness of breath.   Cardiovascular:  Negative for chest pain and palpitations.  Gastrointestinal:  Negative for abdominal pain, constipation, diarrhea, nausea and vomiting.  Genitourinary:  Negative for dysuria and hematuria.  Musculoskeletal:  Negative for arthralgias and back pain.  Skin:  Negative for color change and rash.  Neurological:  Positive for weakness. Negative for seizures and syncope.  All other systems reviewed and are negative.    Physical  Exam Triage Vital Signs ED Triage Vitals  Encounter Vitals Group     BP 02/01/24 1039 (!) 138/92     Systolic BP Percentile --      Diastolic BP Percentile --      Pulse Rate 02/01/24 1039 (!) 103     Resp 02/01/24 1039 18     Temp 02/01/24 1039 98.1 F (36.7 C)     Temp Source 02/01/24 1039 Oral     SpO2 02/01/24 1039 96 %     Weight --      Height --      Head Circumference --      Peak Flow --      Pain Score 02/01/24 1037 0     Pain Loc --      Pain Education --      Exclude from Growth Chart --    No data found.  Updated Vital Signs BP (!) 138/92 (BP Location: Right Arm)   Pulse (!) 103   Temp 98.1 F (36.7 C) (Oral)   Resp 18   SpO2 96%   Visual Acuity Right Eye Distance:   Left Eye Distance:   Bilateral Distance:    Right Eye Near:   Left Eye Near:    Bilateral Near:     Physical Exam Vitals and nursing note reviewed.  Constitutional:      General: She is not in acute distress.    Appearance: She is well-developed. She is not ill-appearing or toxic-appearing.  HENT:     Head: Normocephalic and atraumatic.     Right Ear: Hearing, tympanic membrane, ear canal and external ear normal.     Left Ear: Hearing, tympanic membrane, ear canal and external ear normal.     Nose: Congestion and rhinorrhea present. Rhinorrhea is clear.     Right Sinus: No maxillary sinus tenderness or frontal sinus tenderness.     Left Sinus: No maxillary sinus tenderness or frontal sinus tenderness.     Mouth/Throat:     Lips: Pink.     Mouth: Mucous membranes are moist.     Pharynx: Uvula midline. No oropharyngeal exudate or posterior oropharyngeal erythema.     Tonsils: No tonsillar exudate.  Eyes:     Conjunctiva/sclera: Conjunctivae normal.     Pupils: Pupils are equal, round, and reactive to light.  Cardiovascular:  Rate and Rhythm: Normal rate and regular rhythm.     Heart sounds: S1 normal and S2 normal. No murmur heard. Pulmonary:     Effort: Pulmonary effort is  normal. No respiratory distress.     Breath sounds: Normal breath sounds. No decreased breath sounds, wheezing, rhonchi or rales.  Abdominal:     General: Bowel sounds are normal.     Palpations: Abdomen is soft.     Tenderness: There is no abdominal tenderness.  Musculoskeletal:        General: No swelling.     Cervical back: Neck supple.  Lymphadenopathy:     Head:     Right side of head: No submental, submandibular, tonsillar, preauricular or posterior auricular adenopathy.     Left side of head: No submental, submandibular, tonsillar, preauricular or posterior auricular adenopathy.     Cervical: No cervical adenopathy.     Right cervical: No superficial cervical adenopathy.    Left cervical: No superficial cervical adenopathy.  Skin:    General: Skin is warm and dry.     Capillary Refill: Capillary refill takes less than 2 seconds.     Findings: No rash.  Neurological:     Mental Status: She is alert and oriented to person, place, and time.  Psychiatric:        Mood and Affect: Mood normal.      UC Treatments / Results  Labs (all labs ordered are listed, but only abnormal results are displayed) Labs Reviewed  POCT URINALYSIS DIP (MANUAL ENTRY) - Abnormal; Notable for the following components:      Result Value   Color, UA light yellow (*)    Spec Grav, UA <=1.005 (*)    Blood, UA trace-intact (*)    All other components within normal limits  RESPIRATORY PANEL BY PCR  URINE CULTURE  CBC WITH DIFFERENTIAL/PLATELET  COMPREHENSIVE METABOLIC PANEL WITH GFR    EKG   Radiology No results found.  Procedures Procedures (including critical care time)  Medications Ordered in UC Medications - No data to display  Initial Impression / Assessment and Plan / UC Course  I have reviewed the triage vital signs and the nursing notes.  Pertinent labs & imaging results that were available during my care of the patient were reviewed by me and considered in my medical decision  making (see chart for details).  Plan of Care: Acute viral upper respiratory infection with cough and nasal congestion.  20 panel PCR collected.  CBC with differential and CMP collected.  Will adjust the plan of care, if needed once these test result.  Patient has OTC decongestant cough syrup.  Get plenty of fluids and rest.  Urinary frequency and urgency: Urinalysis is clear.  Urine culture sent to rule out occult UTI.  Follow-up if symptoms do not improve, worsen or new symptoms occur.  I reviewed the plan of care with the patient and/or the patient's guardian.  The patient and/or guardian had time to ask questions and acknowledged that the questions were answered.  I provided instruction on symptoms or reasons to return here or to go to an ER, if symptoms/condition did not improve, worsened or if new symptoms occurred.  Final Clinical Impressions(s) / UC Diagnoses   Final diagnoses:  Urinary frequency  Multiple myeloma in remission (HCC)  Acute cough  Viral URI with cough     Discharge Instructions      Exam is essentially normal with some nasal congestion.  Chest sounds clear.  Will get a respiratory PCR panel, CBC with differential and a CMP.  Will adjust the plan of care, once labs are available.  Get plenty of fluids and rest.  Patient has OTC decongestant cough syrup at home.  Follow-up if symptoms do not improve, worsen or new symptoms occur.   ED Prescriptions   None    PDMP not reviewed this encounter.   Guss Legacy, FNP 02/01/24 1134

## 2024-02-02 LAB — CBC WITH DIFFERENTIAL/PLATELET
Basophils Absolute: 0.1 10*3/uL (ref 0.0–0.2)
Basos: 1 %
EOS (ABSOLUTE): 0.1 10*3/uL (ref 0.0–0.4)
Eos: 2 %
Hematocrit: 42.7 % (ref 34.0–46.6)
Hemoglobin: 14.1 g/dL (ref 11.1–15.9)
Immature Grans (Abs): 0 10*3/uL (ref 0.0–0.1)
Immature Granulocytes: 1 %
Lymphocytes Absolute: 0.8 10*3/uL (ref 0.7–3.1)
Lymphs: 9 %
MCH: 31.8 pg (ref 26.6–33.0)
MCHC: 33 g/dL (ref 31.5–35.7)
MCV: 96 fL (ref 79–97)
Monocytes Absolute: 0.9 10*3/uL (ref 0.1–0.9)
Monocytes: 11 %
Neutrophils Absolute: 6.5 10*3/uL (ref 1.4–7.0)
Neutrophils: 76 %
Platelets: 199 10*3/uL (ref 150–450)
RBC: 4.43 x10E6/uL (ref 3.77–5.28)
RDW: 12.7 % (ref 11.7–15.4)
WBC: 8.4 10*3/uL (ref 3.4–10.8)

## 2024-02-02 LAB — COMPREHENSIVE METABOLIC PANEL WITH GFR
ALT: 30 IU/L (ref 0–32)
AST: 27 IU/L (ref 0–40)
Albumin: 4.2 g/dL (ref 3.8–4.9)
Alkaline Phosphatase: 145 IU/L — ABNORMAL HIGH (ref 44–121)
BUN/Creatinine Ratio: 28 — ABNORMAL HIGH (ref 9–23)
BUN: 32 mg/dL — ABNORMAL HIGH (ref 6–24)
Bilirubin Total: <0.2 mg/dL (ref 0.0–1.2)
CO2: 19 mmol/L — ABNORMAL LOW (ref 20–29)
Calcium: 8.9 mg/dL (ref 8.7–10.2)
Chloride: 100 mmol/L (ref 96–106)
Creatinine, Ser: 1.13 mg/dL — ABNORMAL HIGH (ref 0.57–1.00)
Globulin, Total: 2.4 g/dL (ref 1.5–4.5)
Glucose: 90 mg/dL (ref 70–99)
Potassium: 4.5 mmol/L (ref 3.5–5.2)
Sodium: 136 mmol/L (ref 134–144)
Total Protein: 6.6 g/dL (ref 6.0–8.5)
eGFR: 57 mL/min/{1.73_m2} — ABNORMAL LOW (ref >59)

## 2024-02-03 ENCOUNTER — Ambulatory Visit (HOSPITAL_BASED_OUTPATIENT_CLINIC_OR_DEPARTMENT_OTHER): Payer: Self-pay | Admitting: Family Medicine

## 2024-02-03 LAB — URINE CULTURE

## 2024-02-03 NOTE — Progress Notes (Signed)
 CMP your chemistry panel shows a kidney function with a GFR of 57 but was otherwise a normal CMP.  CBC with differential was normal.  Respiratory panel showed she was positive for rhinovirus.  Urine culture showed multiple contaminants.  Patient is feeling some better.  She will follow-up with hematology oncology as needed.  I was able to reach the patient and review all her labs with her and answer any questions.

## 2024-02-05 ENCOUNTER — Other Ambulatory Visit: Payer: Self-pay | Admitting: Oncology

## 2024-02-05 ENCOUNTER — Inpatient Hospital Stay: Attending: Oncology | Admitting: Oncology

## 2024-02-05 ENCOUNTER — Encounter: Payer: Self-pay | Admitting: Oncology

## 2024-02-05 ENCOUNTER — Other Ambulatory Visit: Payer: Self-pay | Admitting: Hematology and Oncology

## 2024-02-05 VITALS — BP 146/68 | HR 110 | Temp 98.1°F | Resp 16 | Ht 64.0 in | Wt 227.7 lb

## 2024-02-05 DIAGNOSIS — Z79890 Hormone replacement therapy (postmenopausal): Secondary | ICD-10-CM | POA: Insufficient documentation

## 2024-02-05 DIAGNOSIS — G894 Chronic pain syndrome: Secondary | ICD-10-CM | POA: Insufficient documentation

## 2024-02-05 DIAGNOSIS — C9001 Multiple myeloma in remission: Secondary | ICD-10-CM

## 2024-02-05 DIAGNOSIS — J948 Other specified pleural conditions: Secondary | ICD-10-CM | POA: Insufficient documentation

## 2024-02-05 DIAGNOSIS — Z9089 Acquired absence of other organs: Secondary | ICD-10-CM | POA: Insufficient documentation

## 2024-02-05 DIAGNOSIS — Z833 Family history of diabetes mellitus: Secondary | ICD-10-CM | POA: Insufficient documentation

## 2024-02-05 DIAGNOSIS — M25462 Effusion, left knee: Secondary | ICD-10-CM | POA: Insufficient documentation

## 2024-02-05 DIAGNOSIS — M797 Fibromyalgia: Secondary | ICD-10-CM | POA: Diagnosis not present

## 2024-02-05 DIAGNOSIS — C9002 Multiple myeloma in relapse: Secondary | ICD-10-CM | POA: Insufficient documentation

## 2024-02-05 DIAGNOSIS — M542 Cervicalgia: Secondary | ICD-10-CM | POA: Diagnosis not present

## 2024-02-05 DIAGNOSIS — R0489 Hemorrhage from other sites in respiratory passages: Secondary | ICD-10-CM | POA: Insufficient documentation

## 2024-02-05 DIAGNOSIS — F32A Depression, unspecified: Secondary | ICD-10-CM | POA: Insufficient documentation

## 2024-02-05 DIAGNOSIS — M25461 Effusion, right knee: Secondary | ICD-10-CM | POA: Insufficient documentation

## 2024-02-05 DIAGNOSIS — Z881 Allergy status to other antibiotic agents status: Secondary | ICD-10-CM | POA: Insufficient documentation

## 2024-02-05 DIAGNOSIS — N183 Chronic kidney disease, stage 3 unspecified: Secondary | ICD-10-CM | POA: Diagnosis not present

## 2024-02-05 DIAGNOSIS — Z8249 Family history of ischemic heart disease and other diseases of the circulatory system: Secondary | ICD-10-CM | POA: Insufficient documentation

## 2024-02-05 DIAGNOSIS — M1990 Unspecified osteoarthritis, unspecified site: Secondary | ICD-10-CM | POA: Diagnosis not present

## 2024-02-05 DIAGNOSIS — R7989 Other specified abnormal findings of blood chemistry: Secondary | ICD-10-CM | POA: Insufficient documentation

## 2024-02-05 DIAGNOSIS — M4854XA Collapsed vertebra, not elsewhere classified, thoracic region, initial encounter for fracture: Secondary | ICD-10-CM | POA: Diagnosis not present

## 2024-02-05 DIAGNOSIS — Z7969 Long term (current) use of other immunomodulators and immunosuppressants: Secondary | ICD-10-CM | POA: Diagnosis not present

## 2024-02-05 DIAGNOSIS — M791 Myalgia, unspecified site: Secondary | ICD-10-CM | POA: Diagnosis not present

## 2024-02-05 DIAGNOSIS — Z923 Personal history of irradiation: Secondary | ICD-10-CM | POA: Insufficient documentation

## 2024-02-05 DIAGNOSIS — F419 Anxiety disorder, unspecified: Secondary | ICD-10-CM | POA: Diagnosis not present

## 2024-02-05 DIAGNOSIS — G629 Polyneuropathy, unspecified: Secondary | ICD-10-CM | POA: Insufficient documentation

## 2024-02-05 DIAGNOSIS — M2550 Pain in unspecified joint: Secondary | ICD-10-CM | POA: Insufficient documentation

## 2024-02-05 DIAGNOSIS — M549 Dorsalgia, unspecified: Secondary | ICD-10-CM | POA: Diagnosis not present

## 2024-02-05 DIAGNOSIS — J90 Pleural effusion, not elsewhere classified: Secondary | ICD-10-CM | POA: Diagnosis not present

## 2024-02-05 DIAGNOSIS — Z79899 Other long term (current) drug therapy: Secondary | ICD-10-CM | POA: Insufficient documentation

## 2024-02-05 DIAGNOSIS — G893 Neoplasm related pain (acute) (chronic): Secondary | ICD-10-CM | POA: Diagnosis not present

## 2024-02-05 DIAGNOSIS — M51372 Other intervertebral disc degeneration, lumbosacral region with discogenic back pain and lower extremity pain: Secondary | ICD-10-CM | POA: Diagnosis not present

## 2024-02-05 DIAGNOSIS — Z9851 Tubal ligation status: Secondary | ICD-10-CM | POA: Insufficient documentation

## 2024-02-05 DIAGNOSIS — Z83511 Family history of glaucoma: Secondary | ICD-10-CM | POA: Insufficient documentation

## 2024-02-05 DIAGNOSIS — Z8349 Family history of other endocrine, nutritional and metabolic diseases: Secondary | ICD-10-CM | POA: Insufficient documentation

## 2024-02-05 MED ORDER — HYDROCODONE-ACETAMINOPHEN 5-325 MG PO TABS: 1.0000 | ORAL_TABLET | Freq: Four times a day (QID) | ORAL | 0 refills | Status: DC | PRN

## 2024-02-05 NOTE — Progress Notes (Signed)
 [facility]  [address] [place],  [place]  [address] [phone]  Clinic Day: 02/05/24  Referring physician: Barney Boozer, NP  ASSESSMENT & PLAN:  Assessment: Multiple Myeloma This was diagnosed nearly 2 years ago and she has high risk cytogenetics with the deletion of TP53. She has been through  numerous treatments since that time including Teclistamab and CAR-T. Her latest PET scan does show remission with no hypermetabolic activity but she does still have widespread bone lesions and chronic severe pain. 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 She continues to follow with Dr. Jeppie Moles at [facility] for treatment of her myeloma but would like to establish with me as a local oncologist and palliative care practitioner. She has chronic pain of the back, neck, ribs, and left shoulder but is unable to take NSAIDS due to her chronic kidney disease. This bone pain is related to damage to her bones from her widespread lesions but also has some component of peripheral neuropathy, osteoarthritis, and fibromyalgia. She will continue the Zometa  every 3-4 months through [facility].  Peripheral Neuropathy  With all the various chemotherapies she has received she also has peripheral neuropathy and describes her leg pain as burning in both legs. She is on Lyrica  and Cymbalta  but is cautious with the doses due to her chronic kidney disease.  Fibromyalgia She describes deep aching of the muscles especially of the lower extremities. We also know she has significant osteoarthritis and has been told she needs knee replacement surgery.   Chronic Kidney Disease Overall this is much improved as her creatinine was 3.13 one year ago and is currently 1.13. This is being monitored monthly.  Chronic Pain Syndrome She has many reasons to be in pain and I feel depression is related to it as well. I explained opioids are not likely to help much for  neuropathic pain. She does have some morphine  but does not take it and hasn't taken the oxycodone for many months. She is already on some good medications but I will try to increase the Cymbalta  to 20 mg BID and possibly increase the Lyrica  to BID if her renal function allows. I really feel we need MRI scans of the cervical, thoracic, and lumbar spine to further delineate the etiology of her pain as she also likely has degenerative disc disease. I will try her on hydrocodone  5 mg to use 1-2 tablets Q4hrs prn and we may consider adding Tizanidine. Certainly all the treatments she has had has had various effects on her bones and muscles.   Depression She does see someone for this and they have added Pristiq  50mg  3 weeks ago so we need to give it some time and it may also help her pain. She also has some anxiety and psychosocial stressors. Her husband works and her son is autistic and she has elderly parents.   Plan:  Angelamarie was previously cared for by Dr. Veneta Gilding before she transferred all care to [facility]. She is here today for oncology consultation and palliative care. We discussed her extensive medical history and she has received radiation for her plasmacytoma of the left frontal calvarium, receives IVIG once monthly, and will receive her Zometa  injection next month and every 3-4 months. She informed me that she has severe knee pain and is looking forward to getting both knees replaced in the future. She was previously seen at [facility] in [place] by Dr. Naveira for  pain management to no avail and has had a nerve block of both knees which temporarily helped one knee. Patient informed me that due to her stage 3 chronic kidney disease she cannot safely take NSAIDS. She has not taken Tizanidine yet and this might be something to consider. She had a PET scan done on 01/02/2024 that revealed no significant change in distribution of the widespread lytic osseous lesions with mild diffuse heterogenous marrow  actively now not exceeding that of the background blood pool and no focal hypermetabolic osseous lesions. She also had a bone marrow biopsy that revealed no  diagnostic morphologic or immunophenotypic evidence of plasma cell neoplasm and 30% cellular bone marrow with progressive trilineage hematopoiesis. She is currently considered in remission and is on maintenance treatment with [facility] but has requested pain management and palliative care for her constant severe pain. Patient informed me that she is currently on Lyrica  75mg  HS (she tried BID dosing but became anxious that it was causing her creatinine to go up as it was 1.13 in May), Cymbalta  20mg  once daily, and Remeron 15mg  at bedtime, she was prescribe oxycodone 5mg  in 2023 which she still has but rarely takes. I advised her to increase her Cymbalta  to BID. I will order MRI of the cervical, thoracic, and lumbar spine and prescribe her with hydrocodone  5mg  to take 1-2 tablets every 4hrs prn. I will see her back in 2-3 weeks after her scans are done and we will decide whether to try the Tizanidine. I discussed the assessment and treatment plan with the patient.  The patient was provided an opportunity to ask questions and all were answered.  The patient agreed with the plan and demonstrated an understanding of the instructions.  The patient was advised to call back if the symptoms worsen or if the condition fails to improve as anticipated.  Thank you for the opportunity to care for your patients.   I provided 63 minutes of face-to-face time during this this encounter and > 50% was spent counseling as documented under my assessment and plan.    Nolia Baumgartner, MD [facility] - A DEPT OF [facility] [address] [place] [address] Dept: [phone] Dept Fax: [phone]   I, Jasmine Lassiter, am acting as scribe for Nolia Baumgartner, MD  I have reviewed this report as typed by the  medical scribe, and it is complete and accurate.  Nolia Baumgartner, MD   6/19/20257:04 PM  CHIEF COMPLAINT:  CC: igA kappa multiple myeloma  Current Treatment:  Palliative Care and Surveillance   HISTORY OF PRESENT ILLNESS:  BEZA STEPPE is a 58 y.o. female with a history of igA kappa multiple myeloma who is referred in consultation by [facility] for assessment and management. Patient was found to have a FISH with del TPS3 high risk and t(11;14).    June 14, 2022 through June 19 2022: CT head demonstrated multiple lytic lesions throughout the bony calvarium suggesting neoplastic process.  Largest of these was in the left frontal calvarium.  There is a soft tissue mass extending from this large lytic lesion measuring 3.8 x 3.3 cm in size coarse calcifications extending into the scalp and extra-axial location left frontal region.   INR 1.2 PTT 26.3 WBC 9.1 hemoglobin 6.3 MCV 98 platelet count 73; 54 segs 4 bands 35 lymphs 3 monos 2 eos 2S.  Chemistries notable for sodium 132 creatinine 3.30 glucose 122 calcium  13.3 corrected to 14.4 ionized calcium 2.35 albumin 2.9 total protein 12.2 AST 63 ALT 73   Immunofixation showed a biclonal IgA kappa protein Serum free kappa 3067 lambda 2.3 with a kappa lambda 1333.  IgG 215 IgA >6400 IgM 13 Blood viscosity 5.0  24 hr urine showed loss of 1582 mg protein.  UPEP  showed loss of 1023 mg of paraprotein     Hypercalcemia was treated with dexamethasone , IV hydration, symptomatic and calcitonin  June 15 2022: CT chest abdomen pelvis contrast:  Diffuse moth-eaten appearance of the skeleton with bilateral  pathologic rib fractures of various ages. Expansile 2 cm inferior left scapular lytic lesion. Small 2.0 cm soft tissue lesion in the right paraspinal region at the level of the fourth costovertebral  junction. Findings are compatible with suspected multiple myeloma.  Small dependent left pleural effusion Trace pericardial effusion.   No lymphadenopathy.    June 16 2022: Port-A-Cath placed by radiology   June 17, 2022: Cardiac echo.  LVEF 60 to 65%.  Right atrium moderately dilated.  Moderate tricuspid valve regurg.  IVC dilated.  Pulmonary artery systolic pressure moderately elevated at 54 mmHg   June 19 2022: Right posterior iliac crest bone marrow biopsy and aspirate Extensive involvement by atypical plasma cells  representing 70% of all cells in the aspirate associated with variably sized aggregates and diffuse sheets in the clot and biopsy sections.  The plasma cells display kappa light chain restriction consistent with plasma cell neoplasm FISH analysis demonstrated a T p53 deletion as well as Cyclin D1/IgH fusion   June 22 2022:  Post hospital follow up for management of multiple myeloma.    Reviewed results of labs and bone marrow biopsy with patient and family.     June 28 2022:  Admitted to [facility] with bilateral pleural effusions, R >> L.  Therapeutic Right thoracocentesis yielded 2.2 liters of bloody pleural fluid with relief of symptoms.  Cytology showed no malignant cells   June 29, 2022: Discharged home so as to begin chemotherapy.  Cycle 1 day 1 CyBorD July 01 2022:  Presented to [facility] with SOB.   CT CAP showed  Large right hemothorax.  Trace left-sided pleural effusion, decreased in the interval.  New patchy ground-glass opacity anteriorly in the left upper lobe with nodular peripheral ground-glass opacity in the posterior left  upper lobe. Imaging features are nonspecific and may be  infectious/inflammatory Bibasilar collapse/consolidation, right greater than left and similar to 06/28/2022..  Numerous lucent lesions throughout the visualized bony anatomy  compatible with the reported clinical history of multiple myeloma.  Multiple bilateral rib fractures of varying age, as before. Superior endplate compression deformity at T9 and T12 is stable.  No acute findings in  the abdomen or pelvis.  Right Chest tube placed with recovery of bloody pleural fluid.  Labs notable for Hgb 4.8.  Patient was transferred to [facility] for acute management   July 02 2022:  Admitted to [facility].  Placed in ICU.  Chest tube removed as bloody output declined.   Transfused with 3 units PRBCs CT Chest without contrast showed  Right-sided chest tube in place with tip terminating in the right upper lobe with surrounding pulmonary hemorrhage. Similar to prior, the side-port terminates outside of the thoracic cavity. This tube is not well positioned to drain the remaining right-sided pleural fluid.  Moderate sized right hydropneumothorax.      July 08 2022:  WBC 2.6 Hgb 8.2 PLT 69; 39 seg 51 lymph  7 mono 2 eos 1 baso.  Cr 2.37    July 11 2022:  Post hospital follow up.  Having considerable back pain.  Also has pain in  right chest which was site of chest tube.  Fatigued.  Not sleeping well due to pain.  Using narcotic analgesics sparingly because she is concerned about developing addiction.  Has oxycodone,  gabapentin, Trazadone for pain.  Asked her to provide a tally of how many oxycodone tablets she has.  She requested considering addition of a muscle relaxer.   Reviewed results of bone marrow, SPEP with IEP, free light chain and FISH results with patient and her family    WBC 2.7 hemoglobin 7.6 platelet count 93; 32 segs 53 lymphs 6 monos 2 eos 1 basophil 6 immature   Chemistry is notable for creatinine 2.89 albumin 1.6 total protein 10.7.  SPEP with IEP demonstrated an M spike of 6.8 g/dL of paraprotein.  Serum free kappa 4673 lambda 2.8 with a kappa lambda 1453 IgG 188 IgA 4897 IgM 15   July 13 2022:  Cycle 1 Day 8 (delayed)   July 18 2022:  Scheduled follow up for management of multiple myeloma.  Reviewed results of labs with patient, her sister and mother.  She continues to have fatigue and rib pain.  Taking oxycodone sparingly.  Discussed the use of low-dose long-acting MS  Contin.  Since last visit I have also contacted Dr. Rea Cambridge at [facility] to discuss autotransplant.  We will send records regarding patient.  Patient also having scotomas and blurry vision.  I contacted Dr. Curley Double from [facility] to arrange for a follow up.  There is concern that her visual disturbances are related to hyperviscosity from myeloma.  Needing help with disability paperwork.  Requesting home PT.     WBC 1.9 hemoglobin 7.3 platelet count 71 and RBC 1.6   July 19, 2022 received 1 unit of packed RBCs   July 20 2022:  Cycle 1 Day 1 Bortezomib / Lenolidomide/Dex/ Dara July 21, 2022: WBC 1.7 hemoglobin 6.4 platelet count 62; 66 segs 20 lymphs 4 monos 2 eos   July 24, 2022:  Scheduled follow-up for management of multiple myeloma Had episodes of confusion since last visit Exacerbated by taking 1/2 tablets of MS Contin  15 mg for pain (Hold) Has also taken gabapentin.  (Hold) Have held xyzal  since friday Not taking clolopin, trazodone , zyrtec, scopolamine  patch      To receive 2 units of packed red blood cells today   Cycle 1 Day 4 Bortezomib / Lenolidomide/Dex/ Dara   WBC 1.7 hemoglobin 10.3 platelet count 55; 53 segs 37 lymphs 4 monos 3 eos 1 basophil 2 immature granulocytes CMP notable for creatinine 3.03 calcium 7.8 albumin 1.6 total protein 11.4 calculated GFR 17     July 26 2022:  Has been weaned off MS Contin , gabapenin, Xyzal  etc since last visit.  Mentation clearer.  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  Not requiring narcotic analgesics.  Has diarrhea due to d/c of morphine  and continued use of cathartics.  Will hold cathartics.   Appetite remains diminished.  Has dysgeusia but no nausea/ emesis.   Receiving IVF today.     Cycle 1 Day 8 Bortezomib / Lenolidomide/Dex/ Dara   WBC 2.2 hemoglobin 9.4 platelet count 35 ANC 1.52 Chemistries notable for creatinine 3.1 alk phos 128 albumin 2.9   July 31 2022:  Cycle 1  Day 11 Bortezomib / Lenolidomide/Dex/ Dara   August 01 2022:  Anorectic due to dysgeusia (things taste sweet) and nauseated without emesis.  Antiemetics not effective against nausea.  Not sleeping well at night.  Rib pain improving but head bump stable.   Using narcotic analgesics sparingly.  Has lost 2 lbs since last visit.  ECOG 2.  To meet with a counselor regarding disability paperwork.  Interested in obtaining a wig prior to bone marrow transplant WBC 1.1 hemoglobin 8.5 platelet count 21; 53 segs 34 lymphs 9 monos CMP notable for sodium of 128 potassium 3.2 creatinine 2.44 calcium 6.7 albumin 1.7     August 03, 2022: Cycle 1 Day 15 Bortezomib / Lenolidomide/Dex/ Dara   August 08 2022:  Scheduled follow up for management of multiple myeloma. Some improvement in nausea when holding Revlimid  but did not hold it long enough to determine if appetite improved.  To see ophthalmologist; vision worse the last few days.   Not having much pain.  Not using narcotic analgesics.  Feels weak.  Has pimple on buttock. (Will place on Augmentin  875 mg po bid x 1 week)  Taking potassium tablets.  Have drawn repeat myeloma labs to assess if response to therapy.  At next visit order CT chest to assess hemothorax   August 10 2022:  Cycle 2 Day 1 Bortezomib / Lenolidomide/Dex/ Dara   August 14 2022:  BMT consult Floria Hurst (All care was transferred to [facility] at that time)  I have reviewed her chart and materials related to her cancer extensively and collaborated history with the patient. Summary of oncologic history is as follows: Oncology History  Multiple myeloma (HCC)  06/20/2022 Initial Diagnosis   Multiple myeloma (HCC)   06/29/2022 - 07/13/2022 Chemotherapy   Patient is on Treatment Plan : MULTIPLE MYELOMA CyBorD - Weekly Bortezomib      07/12/2022 Cancer Staging   Staging form: Plasma Cell Myeloma and Plasma Cell Disorders, AJCC 8th Edition - Clinical stage from 07/12/2022: RISS Stage III  (Beta-2 -microglobulin (mg/L): 17.7, Albumin (g/dL): 2.9, ISS: Stage III, High-risk cytogenetics: Present, LDH: Normal) - Signed by Wilmar Hammans, MD on 07/13/2022 Histopathologic type: Multiple myeloma Stage prefix: Initial diagnosis Beta 2 microglobulin range (mg/L): Greater than or equal to 5.5 Albumin range (g/dL): Less than 3.5 Cytogenetics: 17p deletion, t(11;14) translocation Lactate dehydrogenase (LDH) (U/L): 133 Serum calcium level: Elevated Pre-operative calcium level (mg/dL): 21.3 Serum creatinine level: Elevated Creatinine (mg/dL): 3.3 Bone disease on imaging: Present Hemoglobin (Hgb) (g/dL): 6.3 Pretreatment IgG (mg/dL): 086 Pretreatment IgA (mg/dL): 5,784 Pretreatment IgM (mg/dL): 13 Pretreatment monoclonal protein level in serum (M spike) (g/dL): 6 Pretreatment monoclonal protein level in 24-hour urine (M spike) (g): 1 Stage used in treatment planning: Yes National guidelines used in treatment planning: Yes   07/20/2022 - 08/10/2022 Chemotherapy   Patient is on Treatment Plan : MYELOMA NEWLY DIAGNOSED TRANSPLANT CANDIDATE DaraVRd (Daratumumab  SQ) q21d x 6 Cycles (Induction/Consolidation)     09/01/2022 - 09/01/2022 Chemotherapy   Patient is on Treatment Plan : MYELOMA RELAPSED/REFRACTORY Elotuzumab + Pomalidomide  + Dexamethasone  (EPd) q28d       INTERVAL HISTORY:  Landis was previously cared for by Dr. Veneta Gilding before she transferred all care to [facility]. She continues to follow with Dr. Jeppie Moles at [facility] for treatment of her myeloma but would like to establish with me as a local oncologist and palliative care practitioner. She has chronic pain of the back, neck, ribs, and left shoulder but is unable to take NSAIDS due to her chronic kidney disease. This bone pain is related to damage to  her bones from her widespread lesions but also has some component of peripheral neuropathy, osteoarthritis, and fibromyalgia. We discussed her extensive medical history and she has  received radiation for her plasmacytoma of the left frontal calvarium, receives IVIG once monthly, and will receive her Zometa  injection next month and every 3-4 months. She informed me that she has severe knee pain and is looking forward to getting both knees replaced in the future. She was previously seen at [facility] in [place] by Dr. Naveira for pain management to no avail and has had a nerve block of both knees which temporarily helped one knee. Patient informed me that due to her stage 3 chronic kidney disease she cannot safely take NSAIDS. She has not taken Tizanidine yet and this might be something to consider. She had a PET scan done on 01/02/2024 that revealed no significant change in distribution of the widespread lytic osseous lesions with mild diffuse heterogenous marrow actively now not exceeding that of the background blood pool and no focal hypermetabolic osseous lesions. She also had a bone marrow biopsy that revealed no  diagnostic morphologic or immunophenotypic evidence of plasma cell neoplasm and 30% cellular bone marrow with progressive trilineage hematopoiesis. She is currently considered in remission and is on maintenance treatment with [facility] but has requested pain management and palliative care for her constant severe pain. Patient informed me that she is currently on Lyrica  75 mg HS (she tried BID dosing but became anxious that it was causing her creatinine to go up as it was 1.13 in May), Cymbalta  20 mg once daily, and Remeron 15 mg at bedtime.  She was prescribed oxycodone 5 mg in 2023 which she still has, but rarely takes. I advised her to increase her Cymbalta  to BID. I will order MRI of the cervical, thoracic, and lumbar spine and prescribe hydrocodone  5 mg to take 1-2 tablets every 4hrs prn. I will see her back in 2-3 weeks after her scans are done and we will decide whether to try the Tizanidine.  She denies fever, chills, night sweats, or other signs of infection. She denies  cardiorespiratory and gastrointestinal issues.  Her appetite is good. Her weight has decreased 3 pounds over last month.    HISTORY:   Past Medical History:  Diagnosis Date   ADHD (attention deficit hyperactivity disorder)    Anxiety    Cancer (HCC)    Chronic kidney disease, stage 3 unspecified (HCC)    Compression fracture of T12 vertebra (HCC)    Depression    Fibromyalgia    GERD (gastroesophageal reflux disease)    Hashimoto's disease    Hypogammaglobulinemia (HCC)    Hypothyroidism    Multiple allergies    Multiple myeloma (HCC)    Osteoarthritis    Peripheral neuropathy     Past Surgical History:  Procedure Laterality Date   ADENOIDECTOMY     BONE MARROW BIOPSY     CHEST TUBE INSERTION     Taken out after 3 days   KNEE SURGERY Bilateral    PARTIAL HYSTERECTOMY     PORTA CATH INSERTION     SINOSCOPY     TONSILLECTOMY     TUBAL LIGATION     WRIST SURGERY Bilateral     Family History  Problem Relation Age of Onset   Hypertension Mother    Diabetes Mother    Glaucoma Mother    Gout Mother    Congestive Heart Failure Mother    Allergic rhinitis Father  Thyroid  disease Father    Allergic rhinitis Brother    Gout Brother    Colon cancer Neg Hx    Rectal cancer Neg Hx     Social History:  reports that she has never smoked. She has never used smokeless tobacco. She reports current alcohol use. She reports that she does not use drugs.The patient is alone today.  Allergies:  Allergies  Allergen Reactions   Doxycycline Other (See Comments)    Other reaction(s): GI Upset (intolerance)   Tape Hives    Skin Irritation    Current Medications: Current Outpatient Medications  Medication Sig Dispense Refill   acyclovir  (ZOVIRAX ) 800 MG tablet Take 1 tablet by mouth 2 (two) times daily.     clonazePAM  (KLONOPIN ) 0.5 MG tablet Take by mouth. (Patient not taking: Reported on 02/13/2024)     desvenlafaxine  (PRISTIQ ) 50 MG 24 hr tablet Take 1 tablet (50 mg total)  by mouth daily. 30 tablet 1   DULoxetine  (CYMBALTA ) 20 MG capsule Take by mouth.     etodolac (LODINE XL) 500 MG 24 hr tablet Take 500 mg by mouth as needed.     famotidine (PEPCID) 20 MG tablet Take by mouth.     HYDROcodone -acetaminophen  (NORCO/VICODIN) 5-325 MG tablet Take 1-2 tablets by mouth every 6 (six) hours as needed for moderate pain (pain score 4-6). 30 tablet 0   levocetirizine (XYZAL ) 5 MG tablet Take 1 tablet by mouth daily.     levothyroxine (SYNTHROID) 88 MCG tablet Take 88 mcg by mouth daily.  3   Melatonin 10 MG CAPS Take 1 capsule by mouth at bedtime.     mirtazapine (REMERON) 15 MG tablet Take 1 tablet by mouth at bedtime.     montelukast  (SINGULAIR ) 10 MG tablet Take 1 tablet by mouth daily.     morphine  (MSIR) 15 MG tablet Take 15 mg by mouth as needed for severe pain (pain score 7-10).     oxyCODONE (OXY IR/ROXICODONE) 5 MG immediate release tablet Take by mouth. Take 1 tablet (5 mg total) by mouth every 4 (four) hours as needed for Moderate pain (4-6) or Severe pain (7-10).     pregabalin  (LYRICA ) 75 MG capsule Take 1 capsule (75 mg total) by mouth 3 (three) times daily. 90 capsule 0   sulfamethoxazole-trimethoprim (BACTRIM DS) 800-160 MG tablet Take by mouth 3 (three) times a week.     Vonoprazan Fumarate (VOQUEZNA) 20 MG TABS Take 20 mg by mouth daily. 30 tablet 1   Current Facility-Administered Medications  Medication Dose Route Frequency Provider Last Rate Last Admin   acetaminophen  (TYLENOL ) tablet 650 mg  650 mg Oral Once Mosher, Kelli A, PA-C        REVIEW OF SYSTEMS:  Review of Systems  Constitutional: Negative.  Negative for appetite change, chills, diaphoresis, fever and unexpected weight change.  HENT:  Negative.  Negative for lump/mass, mouth sores and sore throat.   Eyes: Negative.  Negative for eye problems and icterus.  Respiratory: Negative.  Negative for chest tightness, cough, hemoptysis, shortness of breath and wheezing.   Cardiovascular:  Negative.  Negative for chest pain, leg swelling and palpitations.  Gastrointestinal: Negative.  Negative for abdominal distention, abdominal pain, blood in stool, constipation, diarrhea, nausea, rectal pain and vomiting.  Endocrine: Negative.   Genitourinary: Negative.  Negative for bladder incontinence, difficulty urinating, dyspareunia, dysuria, frequency, hematuria, menstrual problem, nocturia, pelvic pain, vaginal bleeding and vaginal discharge.   Musculoskeletal:  Positive for arthralgias, back pain, myalgias and neck  pain. Negative for flank pain and gait problem.       Bilateral leg pain  Skin: Negative.  Negative for itching, rash and wound.  Neurological:  Negative for dizziness, extremity weakness, gait problem, headaches, light-headedness, numbness, seizures and speech difficulty.  Hematological: Negative.  Negative for adenopathy. Does not bruise/bleed easily.  Psychiatric/Behavioral:  Positive for depression. Negative for confusion, decreased concentration, sleep disturbance and suicidal ideas. The patient is nervous/anxious.     VITALS:   Blood pressure (!) 146/68, pulse (!) 110, temperature 98.1 F (36.7 C), temperature source Oral, resp. rate 16, height 5' 4 (1.626 m), weight 227 lb 11.2 oz (103.3 kg), SpO2 97%.  Wt Readings from Last 3 Encounters:  02/13/24 230 lb (104.3 kg)  02/05/24 227 lb 11.2 oz (103.3 kg)  01/24/24 223 lb (101.2 kg)    Body mass index is 39.08 kg/m.  Performance status (ECOG): 1 - Symptomatic but completely ambulatory  PHYSICAL EXAM:  Physical Exam Vitals and nursing note reviewed. Exam conducted with a chaperone present.  Constitutional:      General: She is not in acute distress.    Appearance: Normal appearance. She is normal weight. She is not toxic-appearing or diaphoretic.  HENT:     Head: Normocephalic and atraumatic.     Comments: Raised lesion of the left frontal scalp measuring about 6cm across, firm and non tender, consistent with  treated plasmacytoma     Right Ear: Tympanic membrane, ear canal and external ear normal. There is no impacted cerumen.     Left Ear: Tympanic membrane, ear canal and external ear normal. There is no impacted cerumen.     Nose: Nose normal. No congestion or rhinorrhea.     Mouth/Throat:     Mouth: Mucous membranes are moist.     Pharynx: Oropharynx is clear. No oropharyngeal exudate or posterior oropharyngeal erythema.   Eyes:     General: No scleral icterus.       Right eye: No discharge.        Left eye: No discharge.     Extraocular Movements: Extraocular movements intact.     Conjunctiva/sclera: Conjunctivae normal.     Pupils: Pupils are equal, round, and reactive to light.    Cardiovascular:     Rate and Rhythm: Normal rate and regular rhythm.     Pulses: Normal pulses.     Heart sounds: Normal heart sounds. No murmur heard.    No friction rub. No gallop.  Pulmonary:     Effort: Pulmonary effort is normal. No respiratory distress.     Breath sounds: Normal breath sounds. No stridor. No wheezing, rhonchi or rales.  Chest:     Chest wall: No tenderness.     Comments: Port in the right upper chest Raised scar in the far lateral right breast from the far line of her chest tube Both breasts are without masses Abdominal:     General: Bowel sounds are normal. There is no distension.     Palpations: Abdomen is soft. There is no hepatomegaly, splenomegaly or mass.     Tenderness: There is no abdominal tenderness. There is no right CVA tenderness, left CVA tenderness, guarding or rebound.     Hernia: No hernia is present.   Musculoskeletal:        General: Normal range of motion.     Cervical back: Normal range of motion and neck supple.     Right lower leg: No edema.     Left lower leg:  No edema.  Lymphadenopathy:     Cervical: No cervical adenopathy.     Right cervical: No superficial, deep or posterior cervical adenopathy.    Left cervical: No superficial, deep or posterior  cervical adenopathy.     Upper Body:     Right upper body: No supraclavicular, axillary or pectoral adenopathy.     Left upper body: No supraclavicular, axillary or pectoral adenopathy.   Skin:    General: Skin is warm and dry.     Coloration: Skin is not jaundiced or pale.     Findings: No bruising, erythema, lesion or rash.   Neurological:     General: No focal deficit present.     Mental Status: She is alert and oriented to person, place, and time. Mental status is at baseline.     Cranial Nerves: No cranial nerve deficit.     Sensory: No sensory deficit.     Coordination: Coordination normal.     Gait: Gait normal.     Deep Tendon Reflexes: Reflexes normal.   Psychiatric:        Mood and Affect: Mood normal.        Behavior: Behavior normal.        Thought Content: Thought content normal.        Judgment: Judgment normal.    LABS:      Latest Ref Rng & Units 02/01/2024   11:23 AM 08/08/2022    1:36 PM 08/01/2022    1:33 PM  CBC  WBC 3.4 - 10.8 x10E3/uL 8.4  1.0  1.1   Hemoglobin 11.1 - 15.9 g/dL 40.9  7.0  8.5   Hematocrit 34.0 - 46.6 % 42.7  21.6  26.4   Platelets 150 - 450 x10E3/uL 199  26  21       Latest Ref Rng & Units 02/01/2024   11:23 AM 08/08/2022    1:36 PM 08/01/2022    1:33 PM  CMP  Glucose 70 - 99 mg/dL 90  811  73   BUN 6 - 24 mg/dL 32  34  25   Creatinine 0.57 - 1.00 mg/dL 9.14  7.82  9.56   Sodium 134 - 144 mmol/L 136  130  128   Potassium 3.5 - 5.2 mmol/L 4.5  4.2  3.2   Chloride 96 - 106 mmol/L 100  101  96   CO2 20 - 29 mmol/L 19  16  16    Calcium 8.7 - 10.2 mg/dL 8.9  7.4  6.7   Total Protein 6.0 - 8.5 g/dL 6.6  21.3  08.6   Total Bilirubin 0.0 - 1.2 mg/dL <5.7  1.2  0.8   Alkaline Phos 44 - 121 IU/L 145  46  45   AST 0 - 40 IU/L 27  45  31   ALT 0 - 32 IU/L 30  39  19      No results found for: CEA1, CEA / No results found for: CEA1, CEA No results found for: PSA1 No results found for: QIO962 No results found for:  XBM841  Lab Results  Component Value Date   TOTALPROTELP 11.7 (H) 08/08/2022   No results found for: TIBC, FERRITIN, IRONPCTSAT Lab Results  Component Value Date   LDH 127 08/08/2022    STUDIES:  No results found.   Exam: 01/02/2024 Whole Body PET Scan Impression: No significant change in distribution of the widespread lytic osseous lesions with mild diffuse heterogenous marrow actively now not exceeding that of the  background blood pool. No focal hypermetabolic osseous lesions. Additional incidental PET findings as described above for which attention on follow-up could be considered.    I,Jasmine M Lassiter,acting as a scribe for Nolia Baumgartner, MD.,have documented all relevant documentation on the behalf of Nolia Baumgartner, MD,as directed by  Nolia Baumgartner, MD while in the presence of Nolia Baumgartner, MD.

## 2024-02-06 ENCOUNTER — Encounter: Payer: Self-pay | Admitting: Oncology

## 2024-02-06 NOTE — Addendum Note (Signed)
 Addended byShelbie Dess on: 02/06/2024 08:44 AM   Modules accepted: Orders

## 2024-02-11 ENCOUNTER — Encounter: Payer: Self-pay | Admitting: Oncology

## 2024-02-12 ENCOUNTER — Other Ambulatory Visit: Payer: Self-pay | Admitting: Hematology and Oncology

## 2024-02-12 MED ORDER — PREGABALIN 75 MG PO CAPS: 75.0000 mg | ORAL_CAPSULE | Freq: Three times a day (TID) | ORAL | 0 refills | Status: DC

## 2024-02-13 ENCOUNTER — Ambulatory Visit (INDEPENDENT_AMBULATORY_CARE_PROVIDER_SITE_OTHER): Admitting: Gastroenterology

## 2024-02-13 ENCOUNTER — Encounter: Payer: Self-pay | Admitting: Gastroenterology

## 2024-02-13 VITALS — BP 124/76 | HR 93 | Ht 64.0 in | Wt 230.0 lb

## 2024-02-13 DIAGNOSIS — K219 Gastro-esophageal reflux disease without esophagitis: Secondary | ICD-10-CM | POA: Diagnosis not present

## 2024-02-13 NOTE — Progress Notes (Signed)
 Chief Complaint: chronic GERD Primary GI Doctor: Dr. Karene Oto   HPI:  Patient is a  58  year old female patient with past medical history of multiple myeloma, GERD,CKD stage 3, Fibromyalgia, anxiety, and depression, who was referred to me by Barney Boozer, NP on 12/14/23 for a complaint of chronic GERD.    Interval History     She states since she has received new treatment Carkykti for multiple myeloma she has had increased symptoms of pyrosis and regurgitation. Patient has history of GERD and currently taking Dexilant 60 mg po daily and famotidine 20 mg once to twice daily. She has trialed and failed Omeprazole, Esomeprazole, and Pantoprazole. Denies dysphagia. Patient denies nausea, vomiting, or weight loss. She reports she has had workup in past and told she had a weak or damaged lower esophageal sphincter.       She does note since starting the infusion for Multiple Myeloma in January she has noted brown grey stools. No diarrhea or urgency. No dark tarry stools.  No alcohol use. Nonsmoker.   Patient's family history includes breast CA in aunt, lung CA in aunt, personal history of multiple myeloma  GI procedures: Last EGD 2017 with Dr. Randal Bury- polyp of stomach and duodenum. Otherwise normal EGD to second portion of duodenum. Bx:neg stomach biopsy. Neg esophagus lower third Last colonoscopy 2019 with Dr. Randal Bury - normal colonoscopy to cecum  Wt Readings from Last 3 Encounters:  02/13/24 230 lb (104.3 kg)  02/05/24 227 lb 11.2 oz (103.3 kg)  01/24/24 223 lb (101.2 kg)     Past Medical History:  Diagnosis Date   ADHD (attention deficit hyperactivity disorder)    Anxiety    Cancer (HCC)    Chronic kidney disease, stage 3 unspecified (HCC)    Compression fracture of T12 vertebra (HCC)    Depression    Fibromyalgia    GERD (gastroesophageal reflux disease)    Hashimoto's disease    Hypogammaglobulinemia (HCC)    Hypothyroidism    Multiple allergies    Multiple myeloma (HCC)     Osteoarthritis    Peripheral neuropathy     Past Surgical History:  Procedure Laterality Date   ADENOIDECTOMY     BONE MARROW BIOPSY     CHEST TUBE INSERTION     Taken out after 3 days   KNEE SURGERY Bilateral    PARTIAL HYSTERECTOMY     PORTA CATH INSERTION     SINOSCOPY     TONSILLECTOMY     TUBAL LIGATION     WRIST SURGERY Bilateral     Current Outpatient Medications  Medication Sig Dispense Refill   acyclovir  (ZOVIRAX ) 800 MG tablet Take 1 tablet by mouth 2 (two) times daily.     desvenlafaxine  (PRISTIQ ) 50 MG 24 hr tablet Take 1 tablet (50 mg total) by mouth daily. 30 tablet 1   DEXILANT 60 MG capsule Take 1 capsule by mouth daily.  0   DULoxetine  (CYMBALTA ) 20 MG capsule Take by mouth.     etodolac (LODINE XL) 500 MG 24 hr tablet Take 500 mg by mouth as needed.     famotidine (PEPCID) 20 MG tablet Take by mouth.     HYDROcodone -acetaminophen  (NORCO/VICODIN) 5-325 MG tablet Take 1-2 tablets by mouth every 6 (six) hours as needed for moderate pain (pain score 4-6). 30 tablet 0   levocetirizine (XYZAL ) 5 MG tablet Take 1 tablet by mouth daily.     levothyroxine (SYNTHROID) 88 MCG tablet Take 88 mcg by  mouth daily.  3   Melatonin 10 MG CAPS Take 1 capsule by mouth at bedtime.     mirtazapine (REMERON) 15 MG tablet Take 1 tablet by mouth at bedtime.     montelukast  (SINGULAIR ) 10 MG tablet Take 1 tablet by mouth daily.     morphine  (MSIR) 15 MG tablet Take 15 mg by mouth as needed for severe pain (pain score 7-10).     oxyCODONE (OXY IR/ROXICODONE) 5 MG immediate release tablet Take by mouth. Take 1 tablet (5 mg total) by mouth every 4 (four) hours as needed for Moderate pain (4-6) or Severe pain (7-10).     pregabalin (LYRICA) 75 MG capsule Take 1 capsule (75 mg total) by mouth 3 (three) times daily. 90 capsule 0   sulfamethoxazole-trimethoprim (BACTRIM DS) 800-160 MG tablet Take by mouth 3 (three) times a week.     clonazePAM  (KLONOPIN ) 0.5 MG tablet Take by mouth.  (Patient not taking: Reported on 02/13/2024)     Current Facility-Administered Medications  Medication Dose Route Frequency Provider Last Rate Last Admin   acetaminophen  (TYLENOL ) tablet 650 mg  650 mg Oral Once Mosher, Kelli A, PA-C        Allergies as of 02/13/2024 - Review Complete 02/13/2024  Allergen Reaction Noted   Doxycycline Other (See Comments) 03/06/2017   Tape Hives 07/02/2022    Family History  Problem Relation Age of Onset   Hypertension Mother    Diabetes Mother    Glaucoma Mother    Gout Mother    Congestive Heart Failure Mother    Allergic rhinitis Father    Thyroid  disease Father    Allergic rhinitis Brother    Gout Brother    Colon cancer Neg Hx    Rectal cancer Neg Hx     Review of Systems:    Constitutional: No weight loss, fever, chills, weakness or fatigue HEENT: Eyes: No change in vision               Ears, Nose, Throat:  No change in hearing or congestion Skin: No rash or itching Cardiovascular: No chest pain, chest pressure or palpitations   Respiratory: No SOB or cough Gastrointestinal: See HPI and otherwise negative Genitourinary: No dysuria or change in urinary frequency Neurological: No headache, dizziness or syncope Musculoskeletal: No new muscle or joint pain Hematologic: No bleeding or bruising Psychiatric: No history of depression or anxiety    Physical Exam:  Vital signs: BP 124/76   Pulse 93   Ht 5' 4 (1.626 m)   Wt 230 lb (104.3 kg)   BMI 39.48 kg/m   Constitutional:   Pleasant  female appears to be in NAD, Well developed, Well nourished, alert and cooperative Throat: Oral cavity and pharynx without inflammation, swelling or lesion.  Respiratory: Respirations even and unlabored. Lungs clear to auscultation bilaterally.   No wheezes, crackles, or rhonchi.  Cardiovascular: Normal S1, S2. Regular rate and rhythm. No peripheral edema, cyanosis or pallor.  Gastrointestinal:  Soft, nondistended, nontender. No rebound or guarding.  Normal bowel sounds. No appreciable masses or hepatomegaly. Rectal:  Not performed.  Msk:  Symmetrical without gross deformities. Without edema, no deformity or joint abnormality.  Neurologic:  Alert and  oriented x4;  grossly normal neurologically.  Skin:   Dry and intact without significant lesions or rashes. Psychiatric: Oriented to person, place and time. Demonstrates good judgement and reason without abnormal affect or behaviors.  RELEVANT LABS AND IMAGING: CBC    Latest Ref Rng & Units  02/01/2024   11:23 AM 08/08/2022    1:36 PM 08/01/2022    1:33 PM  CBC  WBC 3.4 - 10.8 x10E3/uL 8.4  1.0  1.1   Hemoglobin 11.1 - 15.9 g/dL 15.4  7.0  8.5   Hematocrit 34.0 - 46.6 % 42.7  21.6  26.4   Platelets 150 - 450 x10E3/uL 199  26  21      CMP     Latest Ref Rng & Units 02/01/2024   11:23 AM 08/08/2022    1:36 PM 08/01/2022    1:33 PM  CMP  Glucose 70 - 99 mg/dL 90  008  73   BUN 6 - 24 mg/dL 32  34  25   Creatinine 0.57 - 1.00 mg/dL 6.76  1.95  0.93   Sodium 134 - 144 mmol/L 136  130  128   Potassium 3.5 - 5.2 mmol/L 4.5  4.2  3.2   Chloride 96 - 106 mmol/L 100  101  96   CO2 20 - 29 mmol/L 19  16  16    Calcium 8.7 - 10.2 mg/dL 8.9  7.4  6.7   Total Protein 6.0 - 8.5 g/dL 6.6  26.7  12.4   Total Bilirubin 0.0 - 1.2 mg/dL <5.8  1.2  0.8   Alkaline Phos 44 - 121 IU/L 145  46  45   AST 0 - 40 IU/L 27  45  31   ALT 0 - 32 IU/L 30  39  19      Assessment: Encounter Diagnosis  Name Primary?   Gastroesophageal reflux disease, unspecified whether esophagitis present Yes   58 year old female patient with history of GERD that is currently uncontrolled on dual therapy with Dexilant and Pepcid. Reinforced strict GERD diet, no late meals. Encourage weight loss. Will provide samples of Voquenza 10 mg po daily. If no improvement patient enquires about surgical options. Will have her fup with Dr. Karene Oto to discuss her options, would need further evaluation with 24 impendence study and  esophageal manometry.   Plan: - Samples of Voquenza 10mg  po daily provided - Can use OTC Pepcid 20 mg prn for breakthrough symptoms -Provided information on weight loss clinic given per request -Follow-up with Dr. Karene Oto - she would consider surgical options if appropriate and would like to discuss with Dr. Karene Oto at follow-up. She does not want to be on long term PPI therapy.  Thank you for the courtesy of this consult. Please call me with any questions or concerns.   Kelin Nixon, FNP-C [facility] 02/13/2024, 11:49 AM  Cc: Barney Boozer, NP

## 2024-02-13 NOTE — Patient Instructions (Addendum)
 Voquenza 10mg  po daily Pepcid 20mg  as needed Stop Dexilant Can discuss with Dr. Karene Oto at follow-up if surgery is a option. He performs Transoral incisionless fundoplication (TIF) Next door at [facility].  [facility] Address: [address] [place], [place], [place] [address] Phone: [phone]   _______________________________________________________  If your blood pressure at your visit was 140/90 or greater, please contact your primary care physician to follow up on this.  _______________________________________________________  If you are age 2 or older, your body mass index should be between 23-30. Your Body mass index is 39.48 kg/m. If this is out of the aforementioned range listed, please consider follow up with your Primary Care Provider.  If you are age 65 or younger, your body mass index should be between 19-25. Your Body mass index is 39.48 kg/m. If this is out of the aformentioned range listed, please consider follow up with your Primary Care Provider.   ________________________________________________________  The [facility] providers would like to encourage you to use MYCHART to communicate with providers for non-urgent requests or questions.  Due to long hold times on the telephone, sending your provider a message by [facility] may be a faster and more efficient way to get a response.  Please allow 48 business hours for a response.  Please remember that this is for non-urgent requests.  _______________________________________________________   Thank you for trusting me with your gastrointestinal care. Deanna May, RNP

## 2024-02-14 ENCOUNTER — Ambulatory Visit (HOSPITAL_BASED_OUTPATIENT_CLINIC_OR_DEPARTMENT_OTHER)
Admission: RE | Admit: 2024-02-14 | Discharge: 2024-02-14 | Disposition: A | Discharge status: Home / Self Care | Source: Ambulatory Visit | Attending: Oncology | Admitting: Oncology

## 2024-02-14 DIAGNOSIS — M546 Pain in thoracic spine: Secondary | ICD-10-CM | POA: Insufficient documentation

## 2024-02-14 DIAGNOSIS — M5459 Other low back pain: Secondary | ICD-10-CM | POA: Diagnosis not present

## 2024-02-14 DIAGNOSIS — M4854XA Collapsed vertebra, not elsewhere classified, thoracic region, initial encounter for fracture: Secondary | ICD-10-CM | POA: Diagnosis not present

## 2024-02-14 DIAGNOSIS — C9001 Multiple myeloma in remission: Secondary | ICD-10-CM

## 2024-02-14 DIAGNOSIS — M47812 Spondylosis without myelopathy or radiculopathy, cervical region: Secondary | ICD-10-CM | POA: Diagnosis not present

## 2024-02-14 DIAGNOSIS — G8929 Other chronic pain: Secondary | ICD-10-CM | POA: Insufficient documentation

## 2024-02-14 DIAGNOSIS — M542 Cervicalgia: Secondary | ICD-10-CM | POA: Insufficient documentation

## 2024-02-14 MED ORDER — GADOBUTROL 1 MMOL/ML IV SOLN
10.0000 mL | Freq: Once | INTRAVENOUS | Status: AC | PRN
  Administered 2024-02-14: 10 mL via INTRAVENOUS
  Filled 2024-02-14: qty 10

## 2024-02-15 NOTE — Progress Notes (Signed)
 Agree with the assessment and plan as outlined by [facility], FNP-C.   Reviewed upper GI side effect profile for Carvykti which includes nausea (20-31%), vomiting (5-20%), decreased appetite (10-29%), abdominal pain (6%), dysphagia (1%), gastroenteritis (7%).  Given ongoing symptoms despite Dexilant and famotidine, agree with plan to trial Voquenza.  I will typically start with 20 mg daily x 8 weeks, then reduce to the 10 mg daily dosing.  Agree with continuing with Pepcid.  Will see me in follow-up and we can discuss the role/utility of antireflux surgery.  Would plan on upper endoscopy followed by esophageal manometry and pH/impedance testing.  If no appreciable response to Voquenza, would definitely plan for pH/impedance testing to be done off all acid suppression therapy to get a clear baseline for degree of esophageal acid exposure.  Braylon Lemmons, DO, [facility]

## 2024-02-19 ENCOUNTER — Other Ambulatory Visit: Payer: Self-pay | Admitting: Gastroenterology

## 2024-02-19 DIAGNOSIS — K2210 Ulcer of esophagus without bleeding: Secondary | ICD-10-CM

## 2024-02-19 MED ORDER — VOQUEZNA 20 MG PO TABS: 20.0000 mg | ORAL_TABLET | Freq: Every day | ORAL | 1 refills | Status: DC

## 2024-02-19 NOTE — Progress Notes (Signed)
 Voquenza 20mg  po daily sent for 8 weeks

## 2024-02-22 ENCOUNTER — Encounter: Payer: Self-pay | Admitting: Oncology

## 2024-02-26 ENCOUNTER — Other Ambulatory Visit: Payer: Self-pay | Admitting: Oncology

## 2024-02-26 DIAGNOSIS — C9000 Multiple myeloma not having achieved remission: Secondary | ICD-10-CM

## 2024-02-26 MED ORDER — TIZANIDINE HCL 2 MG PO CAPS: 2.0000 mg | ORAL_CAPSULE | Freq: Three times a day (TID) | ORAL | 5 refills | Status: AC

## 2024-03-04 ENCOUNTER — Encounter: Payer: Self-pay | Admitting: Oncology

## 2024-03-10 ENCOUNTER — Encounter: Payer: Self-pay | Admitting: Oncology

## 2024-03-10 ENCOUNTER — Ambulatory Visit: Admitting: Behavioral Health

## 2024-03-10 ENCOUNTER — Encounter: Payer: Self-pay | Admitting: Behavioral Health

## 2024-03-10 DIAGNOSIS — F331 Major depressive disorder, recurrent, moderate: Secondary | ICD-10-CM

## 2024-03-10 DIAGNOSIS — R232 Flushing: Secondary | ICD-10-CM | POA: Diagnosis not present

## 2024-03-10 DIAGNOSIS — F411 Generalized anxiety disorder: Secondary | ICD-10-CM

## 2024-03-10 MED ORDER — DESVENLAFAXINE SUCCINATE ER 50 MG PO TB24
50.0000 mg | ORAL_TABLET | Freq: Every day | ORAL | 1 refills | Status: DC
Start: 2024-03-10 — End: 2024-05-18

## 2024-03-10 MED ORDER — DULOXETINE HCL 20 MG PO CPEP: 20.0000 mg | ORAL_CAPSULE | Freq: Every day | ORAL | 1 refills | Status: DC

## 2024-03-10 NOTE — Progress Notes (Signed)
 Kimberly Esparza [id number] 04-17-1966 58 y.o.  Virtual Visit via Telephone Note  I connected with pt on 03/10/24 at 10:30 AM EDT by telephone and verified that I am speaking with the correct person using two identifiers.   I discussed the limitations, risks, security and privacy concerns of performing an evaluation and management service by telephone and the availability of in person appointments. I also discussed with the patient that there may be a patient responsible charge related to this service. The patient expressed understanding and agreed to proceed.   I discussed the assessment and treatment plan with the patient. The patient was provided an opportunity to ask questions and all were answered. The patient agreed with the plan and demonstrated an understanding of the instructions.   The patient was advised to call back or seek an in-person evaluation if the symptoms worsen or if the condition fails to improve as anticipated.  I provided 30 minutes of non-face-to-face time during this encounter.  The patient was located at home.  The provider was located at [facility].   Kimberly DELENA Pizza, NP   Subjective:   Patient ID:  Kimberly Esparza is a 58 y.o. (DOB [birth year]) female.  Chief Complaint: No chief complaint on file.   HPI Kimberly Esparza presents for follow-up and medication management.  She continues with with treatment for multiple myeloma. Has periods of depression. Just got back from vacation and is feeling fatigue. Frustrated she is unable to keep up sometimes. Reports Pristiq  has significantly helped with hot flashes.  She  has stage 3 kidney failure and understands that we have to be very careful in managing psychiatric medications. She says that her anxiety today is 3/10 and depression is 4/10.  She is sleeping 7-8 hours per night with aid of mirtazapine. She is sleeping 7-8 hours at night. No mania, no psychosis, No SI/HI. Continues to f/u with specialist in  treatment of cancer.   Past Psychiatric Medication Failures as reported by PT   Abilify Lamictal Zoloft Paxil Lexapro Seroquel Celexa Effexor Wellbutrin  Adderall  Cymbalta    Review of Systems:  Review of Systems  Constitutional:  Positive for fatigue.  Allergic/Immunologic: Negative.   Neurological:  Positive for weakness.  Psychiatric/Behavioral:  Positive for dysphoric mood. The patient is nervous/anxious.     Medications: I have reviewed the patient's current medications.  Current Outpatient Medications  Medication Sig Dispense Refill   acyclovir  (ZOVIRAX ) 800 MG tablet Take 1 tablet by mouth 2 (two) times daily.     clonazePAM  (KLONOPIN ) 0.5 MG tablet Take by mouth. (Patient not taking: Reported on 02/13/2024)     desvenlafaxine  (PRISTIQ ) 50 MG 24 hr tablet Take 1 tablet (50 mg total) by mouth daily. 30 tablet 1   DULoxetine  (CYMBALTA ) 20 MG capsule Take 1 capsule (20 mg total) by mouth daily. 90 capsule 1   etodolac (LODINE XL) 500 MG 24 hr tablet Take 500 mg by mouth as needed.     famotidine (PEPCID) 20 MG tablet Take by mouth.     HYDROcodone -acetaminophen  (NORCO/VICODIN) 5-325 MG tablet Take 1-2 tablets by mouth every 6 (six) hours as needed for moderate pain (pain score 4-6). 30 tablet 0   levocetirizine (XYZAL ) 5 MG tablet Take 1 tablet by mouth daily.     levothyroxine (SYNTHROID) 88 MCG tablet Take 88 mcg by mouth daily.  3   Melatonin 10 MG CAPS Take 1 capsule by mouth at bedtime.     mirtazapine (REMERON) 15 MG tablet  Take 1 tablet by mouth at bedtime.     montelukast  (SINGULAIR ) 10 MG tablet Take 1 tablet by mouth daily.     morphine  (MSIR) 15 MG tablet Take 15 mg by mouth as needed for severe pain (pain score 7-10).     oxyCODONE (OXY IR/ROXICODONE) 5 MG immediate release tablet Take by mouth. Take 1 tablet (5 mg total) by mouth every 4 (four) hours as needed for Moderate pain (4-6) or Severe pain (7-10).     pregabalin  (LYRICA ) 75 MG capsule Take 1 capsule  (75 mg total) by mouth 3 (three) times daily. 90 capsule 0   sulfamethoxazole-trimethoprim (BACTRIM DS) 800-160 MG tablet Take by mouth 3 (three) times a week.     tizanidine  (ZANAFLEX ) 2 MG capsule Take 1 capsule (2 mg total) by mouth 3 (three) times daily. 90 capsule 5   Vonoprazan Fumarate  (VOQUEZNA ) 20 MG TABS Take 20 mg by mouth daily. 30 tablet 1   Current Facility-Administered Medications  Medication Dose Route Frequency Provider Last Rate Last Admin   acetaminophen  (TYLENOL ) tablet 650 mg  650 mg Oral Once Mosher, Kelli A, PA-C        Medication Side Effects: None  Allergies:  Allergies  Allergen Reactions   Doxycycline Other (See Comments)    Other reaction(s): GI Upset (intolerance)   Tape Hives    Skin Irritation    Past Medical History:  Diagnosis Date   ADHD (attention deficit hyperactivity disorder)    Anxiety    Cancer (HCC)    Chronic kidney disease, stage 3 unspecified (HCC)    Compression fracture of T12 vertebra (HCC)    Depression    Fibromyalgia    GERD (gastroesophageal reflux disease)    Hashimoto's disease    Hypogammaglobulinemia (HCC)    Hypothyroidism    Multiple allergies    Multiple myeloma (HCC)    Osteoarthritis    Peripheral neuropathy     Family History  Problem Relation Age of Onset   Hypertension Mother    Diabetes Mother    Glaucoma Mother    Gout Mother    Congestive Heart Failure Mother    Allergic rhinitis Father    Thyroid  disease Father    Allergic rhinitis Brother    Gout Brother    Colon cancer Neg Hx    Rectal cancer Neg Hx     Social History   Socioeconomic History   Marital status: Married    Spouse name: Gaither   Number of children: 2   Years of education: 12+2   Highest education level: Associate degree: academic program  Occupational History    Comment: disabled  Tobacco Use   Smoking status: Never   Smokeless tobacco: Never  Vaping Use   Vaping status: Never Used  Substance and Sexual Activity    Alcohol use: Yes    Comment: socially in the past   Drug use: Never   Sexual activity: Not Currently    Partners: Male    Comment: Married but no interest  Other Topics Concern   Not on file  Social History Narrative   Not on file   Social Drivers of Health   Financial Resource Strain: Medium Risk (06/29/2023)   Received from [facility]    Overall Financial Resource Strain (CARDIA)    Difficulty of Paying Living Expenses: Somewhat hard  Food Insecurity: No Food Insecurity (02/05/2024)   Hunger Vital Sign    Worried About Running Out of Food in the Last  Year: Never true    Ran Out of Food in the Last Year: Never true  Transportation Needs: No Transportation Needs (02/05/2024)   PRAPARE - Administrator, Civil Service (Medical): No    Lack of Transportation (Non-Medical): No  Physical Activity: Unknown (06/29/2023)   Received from [facility]    Exercise Vital Sign    On average, how many days per week do you engage in moderate to strenuous exercise (like a brisk walk)?: 0 days    Minutes of Exercise per Session: Not on file  Stress: No Stress Concern Present (06/29/2023)   Received from [facility]    [employer] of Occupational Health - Occupational Stress Questionnaire    Feeling of Stress : Only a little  Social Connections: Socially Integrated (06/29/2023)   Received from [facility]    Social Connection and Isolation Panel    In a typical week, how many times do you talk on the phone with family, friends, or neighbors?: More than three times a week    How often do you get together with friends or relatives?: Once a week    How often do you attend church or religious services?: More than 4 times per year    Do you belong to any clubs or organizations such as church groups, unions, fraternal or athletic groups, or school groups?: Yes    How often do you attend  meetings of the clubs or organizations you belong to?: More than 4 times per year    Are you married, widowed, divorced, separated, never married, or living with a partner?: Married  Intimate Partner Violence: Not At Risk (02/05/2024)   Humiliation, Afraid, Rape, and Kick questionnaire    Fear of Current or Ex-Partner: No    Emotionally Abused: No    Physically Abused: No    Sexually Abused: No    Past Medical History, Surgical history, Social history, and Family history were reviewed and updated as appropriate.   Please see review of systems for further details on the patient's review from today.   Objective:   Physical Exam:  There were no vitals taken for this visit.  Physical Exam Neurological:     Mental Status: She is alert and oriented to person, place, and time.  Psychiatric:        Attention and Perception: Attention and perception normal.        Mood and Affect: Mood normal.        Speech: Speech normal.        Behavior: Behavior normal. Behavior is cooperative.        Cognition and Memory: Cognition and memory normal.        Judgment: Judgment normal.     Comments: Insight intact     Lab Review:     Component Value Date/Time   NA 136 02/01/2024 1123   K 4.5 02/01/2024 1123   CL 100 02/01/2024 1123   CO2 19 (L) 02/01/2024 1123   GLUCOSE 90 02/01/2024 1123   GLUCOSE 101 (H) 08/08/2022 1336   BUN 32 (H) 02/01/2024 1123   CREATININE 1.13 (H) 02/01/2024 1123   CREATININE 3.13 (HH) 08/08/2022 1336   CALCIUM 8.9 02/01/2024 1123   PROT 6.6 02/01/2024 1123   ALBUMIN 4.2 02/01/2024 1123   AST 27 02/01/2024 1123   AST 45 (H) 08/08/2022 1336   ALT 30 02/01/2024 1123   ALT 39 08/08/2022 1336   ALKPHOS 145 (H) 02/01/2024  1123   BILITOT <0.2 02/01/2024 1123   BILITOT 1.2 08/08/2022 1336   GFRNONAA 17 (L) 08/08/2022 1336       Component Value Date/Time   WBC 8.4 02/01/2024 1123   WBC 1.0 (L) 08/08/2022 1336   RBC 4.43 02/01/2024 1123   RBC 2.42 (L) 08/08/2022  1336   HGB 14.1 02/01/2024 1123   HCT 42.7 02/01/2024 1123   PLT 199 02/01/2024 1123   MCV 96 02/01/2024 1123   MCH 31.8 02/01/2024 1123   MCH 28.9 08/08/2022 1336   MCHC 33.0 02/01/2024 1123   MCHC 32.4 08/08/2022 1336   RDW 12.7 02/01/2024 1123   LYMPHSABS 0.8 02/01/2024 1123   MONOABS 0.1 08/08/2022 1336   EOSABS 0.1 02/01/2024 1123   BASOSABS 0.1 02/01/2024 1123    No results found for: POCLITH, LITHIUM   No results found for: PHENYTOIN, PHENOBARB, VALPROATE, CBMZ   .res Assessment: Plan:    Greater than 50% of 30  min face to face time with patient was spent on counseling and coordination of care. We discussed her current track and treatment with Multiple Myeloma and medications. Discussed her improvement with hot flashes with Pristiq .  Frustrated she is limited with medications to to kidneys. She understands that we are more limited due to her stage 3 kidney failure.  We will continue with the low dose Cymbalta  as well. She would like recommendation for online therapy in someone who has experience with chronic diseases.   To continue  Cymbalta  to 20 mg daily.  To start Pristiq  50 mg daily in the am To continue mirtazapine 15 mg  at bedtime daily for now Will follow-up in 12 weeks to reassess Provided emergency contact information PDMP   Kimberly A. Sahej Hauswirth, NP           Diagnoses and all orders for this visit:  Generalized anxiety disorder -     DULoxetine  (CYMBALTA ) 20 MG capsule; Take 1 capsule (20 mg total) by mouth daily. -     desvenlafaxine  (PRISTIQ ) 50 MG 24 hr tablet; Take 1 tablet (50 mg total) by mouth daily.  Major depressive disorder, recurrent episode, moderate (HCC) -     DULoxetine  (CYMBALTA ) 20 MG capsule; Take 1 capsule (20 mg total) by mouth daily. -     desvenlafaxine  (PRISTIQ ) 50 MG 24 hr tablet; Take 1 tablet (50 mg total) by mouth daily.  Hot flashes -     desvenlafaxine  (PRISTIQ ) 50 MG 24 hr tablet; Take 1 tablet (50 mg total)  by mouth daily.    Please see After Visit Summary for patient specific instructions.  Future Appointments  Date Time Provider Department Center  04/23/2024 11:20 AM Cirigliano, Vito V, DO [facility]    No orders of the defined types were placed in this encounter.     -------------------------------

## 2024-04-02 ENCOUNTER — Other Ambulatory Visit: Payer: Self-pay

## 2024-04-02 ENCOUNTER — Other Ambulatory Visit: Payer: Self-pay | Admitting: Oncology

## 2024-04-02 ENCOUNTER — Encounter: Payer: Self-pay | Admitting: Oncology

## 2024-04-02 DIAGNOSIS — M170 Bilateral primary osteoarthritis of knee: Secondary | ICD-10-CM

## 2024-04-02 DIAGNOSIS — C9000 Multiple myeloma not having achieved remission: Secondary | ICD-10-CM

## 2024-04-02 MED ORDER — HYDROCODONE-ACETAMINOPHEN 10-325 MG PO TABS: 1.0000 | ORAL_TABLET | Freq: Four times a day (QID) | ORAL | 0 refills | Status: AC | PRN

## 2024-04-02 MED ORDER — DICLOFENAC EPOLAMINE 1.3 % EX PTCH: 4.0000 | MEDICATED_PATCH | Freq: Every day | CUTANEOUS | 2 refills | Status: AC

## 2024-04-05 ENCOUNTER — Encounter: Payer: Self-pay | Admitting: Oncology

## 2024-04-07 ENCOUNTER — Other Ambulatory Visit: Payer: Self-pay

## 2024-04-07 ENCOUNTER — Telehealth: Payer: Self-pay

## 2024-04-07 DIAGNOSIS — T451X5A Adverse effect of antineoplastic and immunosuppressive drugs, initial encounter: Secondary | ICD-10-CM

## 2024-04-07 NOTE — Telephone Encounter (Signed)
 Opened in error

## 2024-04-08 ENCOUNTER — Other Ambulatory Visit: Payer: Self-pay | Admitting: Oncology

## 2024-04-08 ENCOUNTER — Encounter: Payer: Self-pay | Admitting: Oncology

## 2024-04-08 ENCOUNTER — Other Ambulatory Visit: Payer: Self-pay

## 2024-04-08 DIAGNOSIS — G620 Drug-induced polyneuropathy: Secondary | ICD-10-CM

## 2024-04-08 DIAGNOSIS — C9000 Multiple myeloma not having achieved remission: Secondary | ICD-10-CM

## 2024-04-08 DIAGNOSIS — G894 Chronic pain syndrome: Secondary | ICD-10-CM

## 2024-04-08 MED ORDER — PREGABALIN 75 MG PO CAPS: 75.0000 mg | ORAL_CAPSULE | Freq: Three times a day (TID) | ORAL | 5 refills | Status: DC

## 2024-04-08 NOTE — Telephone Encounter (Deleted)
 M dyhdhs

## 2024-04-11 ENCOUNTER — Encounter: Payer: Self-pay | Admitting: Oncology

## 2024-04-14 ENCOUNTER — Telehealth: Payer: Self-pay

## 2024-04-14 NOTE — Telephone Encounter (Signed)
 Per Dr Yuvonne notes: When I went to order the fentanyl  patches, got a red alert that risky to take with her mirtazipine and cymbalta  so canceled order for now. Tell her I would need to see her periodically if I am prescribing these meds, every few months. If she agrees, tell scheduling to put her on for appt in Oct or Nov but she can report how this is doing and we can refill if helping.   Asiah Befort,RN: I notified patient of Dr Yuvonne statement above. She is requesting a pharmacist or someone to explain to her what the fentanyl , will cause while taking the mirtazapine and Cymbalta ?  Devere Phy,RPH: Concurrent use of FENTANYL  and SEROTONERGIC AGENTS may result in increased risk of serotonin syndrome. Clinical Management: Fentanyl  is a proserotonergic, synthetic piperidine opioid and has been associated with serotonin syndrome when coadministered with other serotonergic drugs Monitor patients for symptoms of serotonin syndrome, including neuromuscular abnormalities (eg, hyperreflexia, tremor, muscle rigidity, clonus, peripheral hypertonicity, shivering), autonomic hyperactivity (eg, tachycardia, mydriasis, diaphoresis, the presence of bowel sounds, diarrhea), and mental status changes (eg, agitation, delirium). Serotonin syndrome can be life-threatening.  This is considered a severe interaction and there are case reports of patients developing SS while taking fentanyl  patches and both mirtazapine and cymbalta .

## 2024-04-18 ENCOUNTER — Encounter: Payer: Self-pay | Admitting: Oncology

## 2024-04-23 ENCOUNTER — Other Ambulatory Visit: Payer: Self-pay

## 2024-04-23 ENCOUNTER — Telehealth (INDEPENDENT_AMBULATORY_CARE_PROVIDER_SITE_OTHER): Admitting: Gastroenterology

## 2024-04-23 DIAGNOSIS — C9000 Multiple myeloma not having achieved remission: Secondary | ICD-10-CM

## 2024-04-23 DIAGNOSIS — K219 Gastro-esophageal reflux disease without esophagitis: Secondary | ICD-10-CM | POA: Diagnosis not present

## 2024-04-23 DIAGNOSIS — R1110 Vomiting, unspecified: Secondary | ICD-10-CM

## 2024-04-23 DIAGNOSIS — R12 Heartburn: Secondary | ICD-10-CM

## 2024-04-23 NOTE — Progress Notes (Signed)
 Chief Complaint: GERD   HPI:    Due to patient request, this scheduled clinical appointment was converted to a telehealth virtual consultation using MyChart video  -Time of medical discussion: 16 minutes -The patient did consent to this virtual visit and is aware of possible charges through their insurance for this visit.  -Names of all parties present: Kimberly Esparza (patient), Sandor Flatter, DO, [facility] (physician) -Patient location: Home -Physician location: Office  Kimberly Esparza is a 58 y.o. female with history of multiple myeloma, GERD,CKD stage 3, Fibromyalgia, anxiety, and depression, osteoarthritis, referred to me by Cathryne May, NP for evaluation of possible antireflux intervention with Transoral Incisionless Fundoplication (TIF) with a goal to stop or significantly reduce acid suppression therapy.  Was seen by Deanna May on 02/13/2024.  Longstanding history of GERD for 30+ years with index symptoms of pyrosis and regurgitation with recent exacerbation of symptoms despite continued Dexilant 60 mg daily and famotidine 20 mg twice daily.  Provided with samples of Voquenza 10 mg daily; did not notice much difference with Voquenza and insurance denied prescription for Voquenza 20 mg tablets, so she subsequently is back to taking Dexilant.  Still with intermittent breakthrough symptoms.  Will have definite breakthrough with single day of missed doses.  She reports having a pH study 20+ years ago and normal per patient.  No report available for review.  Does report having LES laxity reported to her by her previous Gastroenterologist on EGD.  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  Underwent CAR-T infusion at [facility] in 09/2023 and currently doing well.  Not on any chemotherapy now.  Does use acyclovir  and Bactrim DS for prophylaxis x 1 year.  GERD history: -Index symptoms: Heartburn, regurgitation -Exacerbating features: -Medications  trialed: Dexilant, famotidine, omeprazole, esomeprazole, pantoprazole, short trial of Voquenza 10 mg daily -Current medications: Dexilant 60 mg daily, famotidine 20 mg daily with second dose on demand -Complications:   GERD evaluation: -Last EGD: 2017 -Barium esophagram: None -Esophageal Manometry: None -pH/Impedance: 20+ years ago.  No report available for review -Bravo: Ordered today  Endoscopic History: - 2017: EGD (Dr. Towana): Gastric polyp, duodenal polyp, otherwise normal.  Path benign - 2019: Colonoscopy (Dr. Jeppie): Normal    Past medical history, past surgical history, social history, family history, medications, and allergies reviewed in the chart and with patient.    Past Medical History:  Diagnosis Date   ADHD (attention deficit hyperactivity disorder)    Anxiety    Cancer (HCC)    Chronic kidney disease, stage 3 unspecified (HCC)    Compression fracture of T12 vertebra (HCC)    Depression    Fibromyalgia    GERD (gastroesophageal reflux disease)    Hashimoto's disease    Hypogammaglobulinemia (HCC)    Hypothyroidism    Multiple allergies    Multiple myeloma (HCC)    Osteoarthritis    Peripheral neuropathy      Past Surgical History:  Procedure Laterality Date   ADENOIDECTOMY     BONE MARROW BIOPSY     CHEST TUBE INSERTION     Taken out after 3 days   KNEE SURGERY Bilateral    PARTIAL HYSTERECTOMY     PORTA CATH INSERTION     SINOSCOPY     TONSILLECTOMY     TUBAL LIGATION     WRIST SURGERY Bilateral    Family History  Problem Relation Age of  Onset   Hypertension Mother    Diabetes Mother    Glaucoma Mother    Gout Mother    Congestive Heart Failure Mother    Allergic rhinitis Father    Thyroid  disease Father    Allergic rhinitis Brother    Gout Brother    Colon cancer Neg Hx    Rectal cancer Neg Hx    Social History   Tobacco Use   Smoking status: Never   Smokeless tobacco: Never  Vaping Use   Vaping status: Never Used   Substance Use Topics   Alcohol use: Yes    Comment: socially in the past   Drug use: Never   Current Outpatient Medications  Medication Sig Dispense Refill   acyclovir  (ZOVIRAX ) 800 MG tablet Take 1 tablet by mouth 2 (two) times daily.     desvenlafaxine  (PRISTIQ ) 50 MG 24 hr tablet Take 1 tablet (50 mg total) by mouth daily. 30 tablet 1   diclofenac  (FLECTOR ) 1.3 % PTCH Place 4 patches onto the skin daily. Apply 2 patches per knee per day (4 a day total) 120 patch 2   DULoxetine  (CYMBALTA ) 20 MG capsule Take 1 capsule (20 mg total) by mouth daily. 90 capsule 1   etodolac (LODINE XL) 500 MG 24 hr tablet Take 500 mg by mouth as needed.     famotidine (PEPCID) 20 MG tablet Take by mouth.     HYDROcodone -acetaminophen  (NORCO) 10-325 MG tablet Take 1 tablet by mouth every 6 (six) hours as needed. 30 tablet 0   levocetirizine (XYZAL ) 5 MG tablet Take 1 tablet by mouth daily.     levothyroxine (SYNTHROID) 88 MCG tablet Take 88 mcg by mouth daily.  3   Melatonin 10 MG CAPS Take 1 capsule by mouth at bedtime.     mirtazapine (REMERON) 15 MG tablet Take 1 tablet by mouth at bedtime.     montelukast  (SINGULAIR ) 10 MG tablet Take 1 tablet by mouth daily.     morphine  (MSIR) 15 MG tablet Take 15 mg by mouth as needed for severe pain (pain score 7-10).     pregabalin  (LYRICA ) 75 MG capsule Take 1 capsule (75 mg total) by mouth 3 (three) times daily. 90 capsule 5   tizanidine  (ZANAFLEX ) 2 MG capsule Take 1 capsule (2 mg total) by mouth 3 (three) times daily. 90 capsule 5   Vonoprazan Fumarate  (VOQUEZNA ) 20 MG TABS Take 20 mg by mouth daily. 30 tablet 1   Current Facility-Administered Medications  Medication Dose Route Frequency Provider Last Rate Last Admin   acetaminophen  (TYLENOL ) tablet 650 mg  650 mg Oral Once Mosher, Kelli A, PA-C       Allergies  Allergen Reactions   Doxycycline Other (See Comments)    Other reaction(s): GI Upset (intolerance)   Tape Hives    Skin Irritation     Review  of Systems: All systems reviewed and negative except where noted in HPI.     Physical Exam:    Complete physical exam not completed due to the nature of this telehealth communication.   Gen: Awake, alert, and oriented, and well communicative. HEENT: EOMI, non-icteric sclera, NCAT, MMM Neck: Normal movement of head and neck Pulm: No labored breathing, speaking in full sentences without conversational dyspnea Derm: No apparent lesions or bruising in visible field MS: Moves all visible extremities without noticeable abnormality Psych: Pleasant, cooperative, normal speech, thought processing seemingly intact   ASSESSMENT AND PLAN;   1) GERD 2) Heartburn 3) Regurgitation 58 year old  female with longstanding history of GERD.  Has been on multiple acid suppression agents over the years.  Currently on Dexilant 60 mg daily and Pepcid 20 mg, but still with intermittent breakthrough symptoms.  No appreciable response to Voquenza, but that was at 10 mg/day dosing and short sample trial.  Insurance would not cover prescription.  We discussed the pathophysiology and anatomy of reflux at length today.  She is interested in antireflux surgical options as a means to better control her reflux and possibly stop or significantly reduce the need for acid suppression therapy, particular given her recent CKD diagnosis.  Plan for the following:  - EGD to evaluate for erosive esophagitis, degree of LES laxity, and presence of hiatal hernia - Plan for Bravo placement at time of EGD.  Discussed role/utility of pH testing on vs off PPI therapy.  She is a bit hesitant to stop PPIs as she has breakthrough within 1 day of missed dosing.  I think a Bravo study while on therapy would be helpful to evaluate why she is having so much breakthrough and to assess for esophageal acid exposure vs largely nonacid reflux, so we will plan to do Bravo on current therapy - If EGD and Bravo otherwise unrevealing, plan for barium  esophagram - Depending on results of the above, did discuss the role/utility of esophageal manometry and pH/impedance testing as a gold standard study.  At that point, if needed, would plan for those studies to be done off acid suppression therapy - If planning on any antireflux surgery, would certainly need to do so in concert with her Oncologist.  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  Follows with [facility].  Not currently on any therapy.  If planning on any antireflux surgery, would certainly need to do so in concert with Oncology approval with discussion of postoperative infection and wound healing risks.  The indications, risks, and benefits of EGD with Bravo placement were explained to the patient in detail. Risks include but are not limited to bleeding, perforation, adverse reaction to medications, and cardiopulmonary compromise. Sequelae include but are not limited to the possibility of surgery, hospitalization, and mortality. The patient verbalized understanding and wished to proceed. All questions answered, referred to scheduler. Further recommendations pending results of the exam.     Sandor LULLA Flatter, DO, FACG  04/23/2024, 11:42 AM   Silver Lamar LABOR, MD

## 2024-04-23 NOTE — Patient Instructions (Signed)
 You have been scheduled for an endoscopy. Please follow the written instructions given to you at your visit today.  If you use inhalers (even only as needed), please bring them with you on the day of your procedure.  DO NOT TAKE 7 DAYS PRIOR TO TEST- Trulicity (dulaglutide) Ozempic, Wegovy (semaglutide) Mounjaro (tirzepatide) Bydureon Bcise (exanatide extended release)  DO NOT TAKE 1 DAY PRIOR TO YOUR TEST Rybelsus (semaglutide) Adlyxin (lixisenatide) Victoza (liraglutide) Byetta (exanatide) ___________________________________________________________________________  Thank you for entrusting me with your care and for choosing [facility], Dr. Sandor Flatter  _______________________________________________________  If your blood pressure at your visit was 140/90 or greater, please contact your primary care physician to follow up on this.  _______________________________________________________  If you are age 51 or older, your body mass index should be between 23-30. Your There is no height or weight on file to calculate BMI. If this is out of the aforementioned range listed, please consider follow up with your Primary Care Provider.  If you are age 45 or younger, your body mass index should be between 19-25. Your There is no height or weight on file to calculate BMI. If this is out of the aformentioned range listed, please consider follow up with your Primary Care Provider.   ________________________________________________________  The [facility] providers would like to encourage you to use MYCHART to communicate with providers for non-urgent requests or questions.  Due to long hold times on the telephone, sending your provider a message by [facility] may be a faster and more efficient way to get a response.  Please allow 48 business hours for a response.  Please remember that this is for non-urgent requests.  _______________________________________________________  [facility] is using a team-based approach to care.  Your team is made up of your doctor and two to three APPS. Our APPS (Nurse Practitioners and Physician Assistants) work with your physician to ensure care continuity for you. They are fully qualified to address your health concerns and develop a treatment plan. They communicate directly with your gastroenterologist to care for you. Seeing the Advanced Practice Practitioners on your physician's team can help you by facilitating care more promptly, often allowing for earlier appointments, access to diagnostic testing, procedures, and other specialty referrals.

## 2024-04-25 ENCOUNTER — Other Ambulatory Visit (HOSPITAL_COMMUNITY): Payer: Self-pay

## 2024-04-25 ENCOUNTER — Encounter: Payer: Self-pay | Admitting: Gastroenterology

## 2024-04-25 ENCOUNTER — Telehealth: Payer: Self-pay

## 2024-04-25 ENCOUNTER — Encounter: Payer: Self-pay | Admitting: Oncology

## 2024-04-25 NOTE — Telephone Encounter (Signed)
 Please verify as most recent notes on 08.20 state that the patient tried samples of the voquezna  10mg  and she did not see much difference. Looks like script was sent in for 20mg  - is this correct dosage for pa submission?

## 2024-04-25 NOTE — Telephone Encounter (Signed)
 PA request has been Submitted. New Encounter has been or will be created for follow up. For additional info see Pharmacy Prior Auth telephone encounter from 04-25-2024.

## 2024-04-25 NOTE — Telephone Encounter (Signed)
 Pharmacy Patient Advocate Encounter   Received notification from Patient Advice Request messages that prior authorization for Voquezna  20MG  tablets is required/requested.   Insurance verification completed.   The patient is insured through [facility] .   Per test claim: PA required; PA submitted to above mentioned insurance via Latent Key/confirmation #/EOC [id number] Status is pending

## 2024-04-30 NOTE — Telephone Encounter (Signed)
 Please see note below and advise

## 2024-04-30 NOTE — Telephone Encounter (Signed)
 Pharmacy Patient Advocate Encounter  Received notification from [employer] that Prior Authorization for Voquezna  20MG  tablets has been DENIED.  Full denial letter will be uploaded to the media tab. See denial reason below.  Per your health plan's criteria, this drug is covered if you meet the following:  The plan allows us  to cover a drug only when it is used for a 'medically accepted indication.' A medically accepted indication means the use is approved by the Food and Drug Administration (FDA) or one of the following medical resources:  (1) Two articles from major peer-reviewed medical journals (2) [facility] Formulary Service Drug Information (3) DRUGDEX System byu Micromedex VOQUEZNA  TAB 20MG , is not FDA approved or supported by an accepted medical resource for the treatment of your medical condition(s): A condition where stomach acid flows back into the the esophagus (Gastroesophageal reflux disease)  PA #/Case ID/Reference #: [id number]

## 2024-05-02 NOTE — Telephone Encounter (Signed)
 Noted

## 2024-05-02 NOTE — Telephone Encounter (Signed)
 Pharmacy Patient Advocate Encounter   Resubmitted  Per test claim: PA required; PA submitted to above mentioned insurance via Latent Key/confirmation #/EOC [facility] Status is pending

## 2024-05-07 NOTE — Telephone Encounter (Signed)
 Pharmacy Patient Advocate Encounter  Received notification from [employer] that Prior Authorization for Voquezna  20MG  tablets has been DENIED.  Full denial letter will be uploaded to the media tab. See denial reason below.  Per your health plan's criteria, this drug is covered if you meet the following:  Your doctor provides medical records (for example: chart notes) showing one of the following:  A. You have tried or cannot use (for a minimum 8-week supply) one of these generic proton pump inhibitors (medicines that lower stomach acid) in the last 365 days, and they did not work well for you:  I. Omeprazole  II. Esomeprazole III. Pantoprazole IV. Lansoprazole V. Rabeprazole  B. You have tried or cannot use ALL of these generic proton pump inhibitors (medicines that lower stomach acid): I. Omeprazole II. Esomeprazole III. Pantoprazole IV. Lansoprazole V. Rabeprazole VI. Dexlansoprazole  PA #/Case ID/Reference #: [id number]

## 2024-05-17 ENCOUNTER — Other Ambulatory Visit: Payer: Self-pay | Admitting: Behavioral Health

## 2024-05-17 DIAGNOSIS — F411 Generalized anxiety disorder: Secondary | ICD-10-CM

## 2024-05-17 DIAGNOSIS — F331 Major depressive disorder, recurrent, moderate: Secondary | ICD-10-CM

## 2024-05-17 DIAGNOSIS — R232 Flushing: Secondary | ICD-10-CM

## 2024-05-19 ENCOUNTER — Encounter: Payer: Self-pay | Admitting: Oncology

## 2024-05-19 ENCOUNTER — Ambulatory Visit (AMBULATORY_SURGERY_CENTER): Admitting: Gastroenterology

## 2024-05-19 ENCOUNTER — Encounter: Payer: Self-pay | Admitting: Gastroenterology

## 2024-05-19 ENCOUNTER — Encounter

## 2024-05-19 VITALS — BP 96/57 | HR 93 | Temp 98.0°F | Resp 14 | Ht 64.0 in | Wt 241.0 lb

## 2024-05-19 DIAGNOSIS — K317 Polyp of stomach and duodenum: Secondary | ICD-10-CM

## 2024-05-19 DIAGNOSIS — Z01818 Encounter for other preprocedural examination: Secondary | ICD-10-CM | POA: Diagnosis not present

## 2024-05-19 DIAGNOSIS — R12 Heartburn: Secondary | ICD-10-CM | POA: Diagnosis not present

## 2024-05-19 DIAGNOSIS — R1110 Vomiting, unspecified: Secondary | ICD-10-CM | POA: Diagnosis not present

## 2024-05-19 DIAGNOSIS — K219 Gastro-esophageal reflux disease without esophagitis: Secondary | ICD-10-CM | POA: Diagnosis not present

## 2024-05-19 DIAGNOSIS — D131 Benign neoplasm of stomach: Secondary | ICD-10-CM

## 2024-05-19 MED ORDER — SODIUM CHLORIDE 0.9 % IV SOLN
4.0000 mg | Freq: Once | INTRAVENOUS | Status: AC
  Administered 2024-05-19: 4 mg via INTRAVENOUS

## 2024-05-19 MED ORDER — SODIUM CHLORIDE 0.9 % IV SOLN: 500.0000 mL | Freq: Once | INTRAVENOUS | Status: AC

## 2024-05-19 NOTE — Progress Notes (Signed)
 Pt's states no medical or surgical changes since previsit or office visit.  Bravo pre education completed with patient and spouse.

## 2024-05-19 NOTE — Progress Notes (Signed)
 GASTROENTEROLOGY PROCEDURE H&P NOTE   Primary Care Physician: Silver Lamar LABOR, MD    Reason for Procedure:   GERD, heartburn, regurgitation pre-operative evaluation  Plan:    EGD with Bravo placement (ON PPI therapy)  Patient is appropriate for endoscopic procedure(s) in the ambulatory ([facility]) setting.  The nature of the procedure, as well as the risks, benefits, and alternatives were carefully and thoroughly reviewed with the patient. Ample time for discussion and questions allowed. The patient understood, was satisfied, and agreed to proceed.     HPI: Kimberly Esparza is a 58 y.o. female who presents for EGD with Bravo placement for evaluation of GERD with breakthrough heartburn and regurgitation despite PPI therapy.  Patient was most recently seen in the Gastroenterology Clinic on 04/23/2024 by me. Currently taking Dexilant 60 mg daily and Pepcid 20 mg, but still with intermittent breakthrough symptoms. .  No interval change in medical history since that appointment. Please refer to that note for full details regarding GI history and clinical presentation.   Past Medical History:  Diagnosis Date   ADHD (attention deficit hyperactivity disorder)    Allergy     Anxiety    Blood transfusion without reported diagnosis    Cancer (HCC)    Cataract    Chronic kidney disease, stage 3 unspecified (HCC)    Compression fracture of T12 vertebra (HCC)    Depression    Fibromyalgia    GERD (gastroesophageal reflux disease)    Hashimoto's disease    Hypogammaglobulinemia (HCC)    Hypothyroidism    Multiple allergies    Multiple myeloma (HCC)    Osteoarthritis    Osteoporosis    Peripheral neuropathy     Past Surgical History:  Procedure Laterality Date   ADENOIDECTOMY     BONE MARROW BIOPSY     CHEST TUBE INSERTION     Taken out after 3 days   KNEE SURGERY Bilateral    PARTIAL HYSTERECTOMY     PORTA CATH INSERTION     SINOSCOPY     TONSILLECTOMY     TUBAL LIGATION      WRIST SURGERY Bilateral     Prior to Admission medications   Medication Sig Start Date End Date Taking? Authorizing Provider  acyclovir  (ZOVIRAX ) 800 MG tablet Take 1 tablet by mouth 2 (two) times daily. 10/09/23  Yes [provider]  dexlansoprazole (DEXILANT) 60 MG capsule Take 60 mg by mouth daily. 03/24/24  Yes [provider]  DULoxetine  (CYMBALTA ) 20 MG capsule Take 1 capsule (20 mg total) by mouth daily. 03/10/24  Yes White, Redell A, NP  etodolac (LODINE XL) 500 MG 24 hr tablet Take 500 mg by mouth as needed.   Yes [provider]  famotidine (PEPCID) 20 MG tablet Take by mouth. 02/23/23  Yes [provider]  levocetirizine (XYZAL ) 5 MG tablet Take 1 tablet by mouth daily. 02/12/17  Yes [provider]  levothyroxine (SYNTHROID) 88 MCG tablet Take 88 mcg by mouth daily. 08/14/16  Yes [provider]  Melatonin 10 MG CAPS Take 1 capsule by mouth at bedtime.   Yes [provider]  mirtazapine (REMERON) 15 MG tablet Take 1 tablet by mouth at bedtime. 04/25/23  Yes [provider]  montelukast  (SINGULAIR ) 10 MG tablet Take 1 tablet by mouth daily.   Yes [provider]  morphine  (MSIR) 15 MG tablet Take 15 mg by mouth as needed for severe pain (pain score 7-10).   Yes [provider]  tizanidine  (ZANAFLEX ) 2 MG capsule Take 1 capsule (2 mg total) by mouth 3 (three) times daily. Patient taking differently: Take 4 mg by mouth 3 (three) times daily. 02/26/24  Yes Cornelius Wanda DEL, MD  Zoledronic  Acid (ZOMETA  IV) Inject 1 Application into the vein as directed.   Yes [provider]  Calcium-Magnesium-Vitamin D  (CITRACAL CALCIUM +D3) 600-40-500 MG-MG-UNIT TB24 as directed Orally    [provider]  clonazePAM  (KLONOPIN ) 0.5 MG tablet Take 0.5 mg by mouth.    [provider]  desvenlafaxine  (PRISTIQ ) 50 MG 24 hr tablet TAKE 1 TABLET(50 MG) BY MOUTH DAILY 05/18/24   Teresa Rogue A, NP   diclofenac  (FLECTOR ) 1.3 % PTCH Place 4 patches onto the skin daily. Apply 2 patches per knee per day (4 a day total) 04/02/24   Cornelius Wanda DEL, MD  HYDROcodone -acetaminophen  (NORCO) 10-325 MG tablet Take 1 tablet by mouth every 6 (six) hours as needed. 04/02/24   Cornelius Wanda DEL, MD    Current Outpatient Medications  Medication Sig Dispense Refill   acyclovir  (ZOVIRAX ) 800 MG tablet Take 1 tablet by mouth 2 (two) times daily.     dexlansoprazole (DEXILANT) 60 MG capsule Take 60 mg by mouth daily.     DULoxetine  (CYMBALTA ) 20 MG capsule Take 1 capsule (20 mg total) by mouth daily. 90 capsule 1   etodolac (LODINE XL) 500 MG 24 hr tablet Take 500 mg by mouth as needed.     famotidine (PEPCID) 20 MG tablet Take by mouth.     levocetirizine (XYZAL ) 5 MG tablet Take 1 tablet by mouth daily.     levothyroxine (SYNTHROID) 88 MCG tablet Take 88 mcg by mouth daily.  3   Melatonin 10 MG CAPS Take 1 capsule by mouth at bedtime.     mirtazapine (REMERON) 15 MG tablet Take 1 tablet by mouth at bedtime.     montelukast  (SINGULAIR ) 10 MG tablet Take 1 tablet by mouth daily.     morphine  (MSIR) 15 MG tablet Take 15 mg by mouth as needed for severe pain (pain score 7-10).     tizanidine  (ZANAFLEX ) 2 MG capsule Take 1 capsule (2 mg total) by mouth 3 (three) times daily. (Patient taking differently: Take 4 mg by mouth 3 (three) times daily.) 90 capsule 5   Zoledronic  Acid (ZOMETA  IV) Inject 1 Application into the vein as directed.     Calcium-Magnesium-Vitamin D  (CITRACAL CALCIUM +D3) 600-40-500 MG-MG-UNIT TB24 as directed Orally     clonazePAM  (KLONOPIN ) 0.5 MG tablet Take 0.5 mg by mouth.     desvenlafaxine  (PRISTIQ ) 50 MG 24 hr tablet TAKE 1 TABLET(50 MG) BY MOUTH DAILY 30 tablet 1   diclofenac  (FLECTOR ) 1.3 % PTCH Place 4 patches onto the skin daily. Apply 2 patches per knee per day (4 a day total) 120 patch 2   HYDROcodone -acetaminophen  (NORCO) 10-325 MG tablet Take 1 tablet by mouth every 6  (six) hours as needed. 30 tablet 0   Current Facility-Administered Medications  Medication Dose Route Frequency Provider Last Rate Last Admin   0.9 %  sodium chloride  infusion  500 mL Intravenous Once Zori Benbrook V, DO       acetaminophen  (TYLENOL ) tablet 650 mg  650 mg Oral Once Mosher, Kelli A, PA-C        Allergies as of 05/19/2024 - Review Complete 05/19/2024  Allergen Reaction Noted   Doxycycline Other (See Comments) 03/06/2017   Tape Hives 07/02/2022    Family History  Problem Relation Age  of Onset   Hypertension Mother    Diabetes Mother    Glaucoma Mother    Gout Mother    Congestive Heart Failure Mother    Allergic rhinitis Father    Thyroid  disease Father    Allergic rhinitis Brother    Gout Brother    Colon cancer Neg Hx    Rectal cancer Neg Hx    Stomach cancer Neg Hx    Esophageal cancer Neg Hx     Social History   Socioeconomic History   Marital status: Married    Spouse name: Gaither   Number of children: 2   Years of education: 12+2   Highest education level: Associate degree: academic program  Occupational History    Comment: disabled  Tobacco Use   Smoking status: Never   Smokeless tobacco: Never  Vaping Use   Vaping status: Never Used  Substance and Sexual Activity   Alcohol use: Yes    Comment: socially in the past   Drug use: Never   Sexual activity: Not Currently    Partners: Male    Comment: Married but no interest  Other Topics Concern   Not on file  Social History Narrative   Not on file   Social Drivers of Health   Financial Resource Strain: Low Risk  (04/17/2024)   Received from [facility]   Overall Financial Resource Strain (CARDIA)    How hard is it for you to pay for the very basics like food, housing, medical care, and heating?: Not hard at all  Food Insecurity: No Food Insecurity (04/17/2024)   Received from [facility]   Hunger Vital Sign    Within the past 12 months, you worried that your food would run out  before you got the money to buy more.: Never true    Within the past 12 months, the food you bought just didn't last and you didn't have money to get more.: Never true  Transportation Needs: No Transportation Needs (04/17/2024)   Received from [facility] - Transportation    In the past 12 months, has lack of transportation kept you from medical appointments or from getting medications?: No    In the past 12 months, has lack of transportation kept you from meetings, work, or from getting things needed for daily living?: No  Physical Activity: Unknown (06/29/2023)   Received from [facility]    Exercise Vital Sign    On average, how many days per week do you engage in moderate to strenuous exercise (like a brisk walk)?: 0 days    Minutes of Exercise per Session: Not on file  Stress: Not on file (06/29/2023)  Social Connections: Socially Integrated (06/29/2023)   Received from [facility]    Social Connection and Isolation Panel    In a typical week, how many times do you talk on the phone with family, friends, or neighbors?: More than three times a week    How often do you get together with friends or relatives?: Once a week    How often do you attend church or religious services?: More than 4 times per year    Do you belong to any clubs or organizations such as church groups, unions, fraternal or athletic groups, or school groups?: Yes    How often do you attend meetings of the clubs or organizations you belong to?: More than 4 times per year    Are you married, widowed, divorced, separated,  never married, or living with a partner?: Married  Intimate Partner Violence: Not At Risk (02/05/2024)   Humiliation, Afraid, Rape, and Kick questionnaire    Fear of Current or Ex-Partner: No    Emotionally Abused: No    Physically Abused: No    Sexually Abused: No    Physical Exam: Vital signs in last 24 hours: @BP  (!) 154/82   Pulse (!)  103   Temp 98 F (36.7 C)   Ht 5' 4 (1.626 m)   Wt 241 lb (109.3 kg)   SpO2 97%   BMI 41.37 kg/m  GEN: NAD EYE: Sclerae anicteric ENT: MMM CV: Non-tachycardic Pulm: CTA b/l GI: Soft, NT/ND NEURO:  Alert & Oriented x 3   Sandor Flatter, DO [facility]   05/19/2024 10:14 AM

## 2024-05-19 NOTE — Progress Notes (Signed)
 Capsule expiration date- 09/20/25  Capsule ID number [id number]  LES measurement: 38 (capsule placed 6 cm above LES) 32  Time of implant: 1035

## 2024-05-19 NOTE — Patient Instructions (Signed)
 YOU HAD AN ENDOSCOPIC PROCEDURE TODAY AT THE [facility]:   Refer to the procedure report that was given to you for any specific questions about what was found during the examination.  If the procedure report does not answer your questions, please call your gastroenterologist to clarify.  If you requested that your care partner not be given the details of your procedure findings, then the procedure report has been included in a sealed envelope for you to review at your convenience later.  YOU SHOULD EXPECT: Some feelings of bloating in the abdomen. Passage of more gas than usual.  Walking can help get rid of the air that was put into your GI tract during the procedure and reduce the bloating. If you had a lower endoscopy (such as a colonoscopy or flexible sigmoidoscopy) you may notice spotting of blood in your stool or on the toilet paper. If you underwent a bowel prep for your procedure, you may not have a normal bowel movement for a few days.  Please Note:  You might notice some irritation and congestion in your nose or some drainage.  This is from the oxygen used during your procedure.  There is no need for concern and it should clear up in a day or so.  SYMPTOMS TO REPORT IMMEDIATELY:   Following upper endoscopy (EGD)  Vomiting of blood or coffee ground material  New chest pain or pain under the shoulder blades  Painful or persistently difficult swallowing  New shortness of breath  Fever of 100F or higher  Black, tarry-looking stools  Resume previous diet Continue present medications Await pathology results Resume acid suppression medications (Dexilant and famotidine) as prescribed Will follow up on Bravo results when that has been interpreted   For urgent or emergent issues, a gastroenterologist can be reached at any hour by calling [phone]. Do not use MyChart messaging for urgent concerns.    DIET:  We do recommend a small meal at first, but then you may proceed  to your regular diet.  Drink plenty of fluids but you should avoid alcoholic beverages for 24 hours.  ACTIVITY:  You should plan to take it easy for the rest of today and you should NOT DRIVE or use heavy machinery until tomorrow (because of the sedation medicines used during the test).    FOLLOW UP: Our staff will call the number listed on your records the next business day following your procedure.  We will call around 7:15- 8:00 am to check on you and address any questions or concerns that you may have regarding the information given to you following your procedure. If we do not reach you, we will leave a message.     If any biopsies were taken you will be contacted by phone or by letter within the next 1-3 weeks.  Please call us  at [phone] if you have not heard about the biopsies in 3 weeks.    SIGNATURES/CONFIDENTIALITY: You and/or your care partner have signed paperwork which will be entered into your electronic medical record.  These signatures attest to the fact that that the information above on your After Visit Summary has been reviewed and is understood.  Full responsibility of the confidentiality of this discharge information lies with you and/or your care-partner.

## 2024-05-19 NOTE — Progress Notes (Signed)
 Sedate, gd SR, tolerated procedure well, VSS, report to RN

## 2024-05-19 NOTE — Progress Notes (Signed)
 Called to room to assist during endoscopic procedure.  Patient ID and intended procedure confirmed with present staff. Received instructions for my participation in the procedure from the performing physician.

## 2024-05-19 NOTE — Op Note (Signed)
 [name] Patient Name: Kimberly Esparza Procedure Date: 05/19/2024 10:08 AM MRN: [id number] Endoscopist: Sandor Flatter , MD, [id number] Age: 58 Referring MD:  Date of Birth: [birth year] Gender: Female Account #: [id number] Procedure:                Upper GI endoscopy with Bravo placement (ON PPI and                            H2RA) and biopsy Indications:              Heartburn, Esophageal reflux, Preoperative                            assessment, Regurgitation                           58 y.o. female who presents for EGD with Bravo                            placement for evaluation of GERD with breakthrough                            heartburn and regurgitation despite PPI therapy.                            Currently taking Dexilant 60 mg daily and Pepcid 20                            mg, but still with intermittent breakthrough                            symptoms. Presents for Bravo placement on therapy. Medicines:                Monitored Anesthesia Care Procedure:                Pre-Anesthesia Assessment:                           - Prior to the procedure, a History and Physical                            was performed, and patient medications and                            allergies were reviewed. The patient's tolerance of                            previous anesthesia was also reviewed. The risks                            and benefits of the procedure and the sedation                            options and risks were discussed with the patient.  All questions were answered, and informed consent                            was obtained. Prior Anticoagulants: The patient has                            taken no anticoagulant or antiplatelet agents. ASA                            Grade Assessment: II - A patient with mild systemic                            disease. After reviewing the risks and benefits,                            the patient  was deemed in satisfactory condition to                            undergo the procedure.                           After obtaining informed consent, the endoscope was                            passed under direct vision. Throughout the                            procedure, the patient's blood pressure, pulse, and                            oxygen saturations were monitored continuously. The                            GIF [id number] #[id number] was introduced through the                            mouth, and advanced to the second part of duodenum.                            The upper GI endoscopy was accomplished without                            difficulty. The patient tolerated the procedure                            well. Scope In: Scope Out: Findings:                 The examined esophagus was normal.                           The Z-line was regular and was found 38 cm from the                            incisors.  The BRAVO capsule with delivery system                            was introduced through the mouth and advanced into                            the esophagus, such that the BRAVO pH capsule was                            positioned 32 cm from the incisors, which was 6 cm                            proximal to the GE junction. The BRAVO pH capsule                            was then deployed and attached to the esophageal                            mucosa. The delivery system was then withdrawn.                            Endoscopy was utilized for probe placement and                            diagnostic evaluation. The scope was reinserted to                            evaluate placement of the BRAVO capsule.                            Visualization showed the BRAVO capsule to be in an                            appropriate position.                           The gastroesophageal flap valve was visualized                            endoscopically and classified as Hill Grade II                             (fold present, opens with respiration).                           Multiple small sessile polyps with no bleeding were                            found in the gastric fundus and in the gastric                            body. Several of these polyps were removed with a  cold biopsy forceps for histologic representative                            evaluation. Resection and retrieval were complete.                            Estimated blood loss was minimal.                           The examined duodenum was normal. Complications:            No immediate complications. Estimated Blood Loss:     Estimated blood loss was minimal. Impression:               - Normal esophagus.                           - Z-line regular, 38 cm from the incisors.                           - Gastroesophageal flap valve classified as Hill                            Grade II (fold present, opens with respiration).                           - Multiple gastric polyps. Resected and retrieved.                           - Normal examined duodenum.                           - The BRAVO pH capsule was deployed. Recommendation:           - Patient has a contact number available for                            emergencies. The signs and symptoms of potential                            delayed complications were discussed with the                            patient. Return to normal activities tomorrow.                            Written discharge instructions were provided to the                            patient.                           - Resume previous diet.                           - Continue present medications.                           -  Await pathology results.                           - Resume acid suppression medications (Dexilant and                            famotidine) as prescribed.                           - Will follow-up on Bravo results when that  has                            been interpreted. Sandor Flatter, MD 05/19/2024 10:42:47 AM

## 2024-05-20 ENCOUNTER — Telehealth: Payer: Self-pay

## 2024-05-20 NOTE — Telephone Encounter (Signed)
  Follow up Call-     05/19/2024    9:35 AM  Call back number  Post procedure Call Back phone  # [phone]  Permission to leave phone message Yes     Patient questions:  Do you have a fever, pain , or abdominal swelling? No. Pain Score  0 *  Have you tolerated food without any problems? Yes.  Have you been able to return to your normal activities? Yes.    Do you have any questions about your discharge instructions: Diet   No. Medications  No. Follow up visit  No.  Do you have questions or concerns about your Care? No.  Actions: * If pain score is 4 or above: No action needed, pain <4.

## 2024-05-21 LAB — SURGICAL PATHOLOGY

## 2024-05-29 ENCOUNTER — Ambulatory Visit: Payer: Self-pay | Admitting: Gastroenterology

## 2024-06-05 ENCOUNTER — Encounter: Payer: Self-pay | Admitting: Gastroenterology

## 2024-06-11 ENCOUNTER — Ambulatory Visit: Payer: Self-pay | Admitting: Gastroenterology

## 2024-06-12 ENCOUNTER — Other Ambulatory Visit: Payer: Self-pay

## 2024-06-12 ENCOUNTER — Encounter: Payer: Self-pay | Admitting: Behavioral Health

## 2024-06-12 ENCOUNTER — Ambulatory Visit: Admitting: Behavioral Health

## 2024-06-12 DIAGNOSIS — K219 Gastro-esophageal reflux disease without esophagitis: Secondary | ICD-10-CM

## 2024-06-12 DIAGNOSIS — F411 Generalized anxiety disorder: Secondary | ICD-10-CM | POA: Diagnosis not present

## 2024-06-12 DIAGNOSIS — R12 Heartburn: Secondary | ICD-10-CM

## 2024-06-12 DIAGNOSIS — R1110 Vomiting, unspecified: Secondary | ICD-10-CM

## 2024-06-12 DIAGNOSIS — F332 Major depressive disorder, recurrent severe without psychotic features: Secondary | ICD-10-CM

## 2024-06-12 DIAGNOSIS — F329 Major depressive disorder, single episode, unspecified: Secondary | ICD-10-CM

## 2024-06-12 MED ORDER — BREXPIPRAZOLE 1 MG PO TABS: 1.0000 mg | ORAL_TABLET | Freq: Every day | ORAL | 1 refills | Status: DC

## 2024-06-12 MED ORDER — DESVENLAFAXINE SUCCINATE ER 100 MG PO TB24: 100.0000 mg | ORAL_TABLET | Freq: Every day | ORAL | 1 refills | Status: DC

## 2024-06-12 NOTE — Progress Notes (Signed)
 [facility]  Patient ID: Kimberly Esparza,  MRN: [id number]  PCP: Silver Lamar LABOR, MD  Date of Evaluation: 06/12/2024 Time spent:30 minutes  Chief Complaint:  Chief Complaint   Anxiety; Depression; Fatigue; Follow-up; Medication Refill; Patient Education; Medication Problem     HISTORY/CURRENT STATUS: HPI  Kimberly Esparza presents for follow-up and medication management.  She continues with with treatment for multiple myeloma. She reports continued chronic depression today.  She understand we have tried many different medication without long term success. She states that her doctors would like her to stop Cymbalta  due to treatment for pain management contraindications. She has questions today about IV Ketamine for treatment resistant depression and pain. She will attempt to find provider for consult for this.  Also interested in trying Rexulti for adjunctive therapy for depression. Had several failures with other AP's.   Reports Pristiq  has significantly helped with hot flashes.  She  has stage 3 kidney failure and understands that we have to be very careful in managing psychiatric medications. She says that her anxiety today is 4/10 and depression is 5/10.  She is sleeping 7-8 hours per night with aid of mirtazapine. She is sleeping 7-8 hours at night. No mania, no psychosis, No SI/HI. Continues to f/u with specialist in treatment of cancer.   Past Psychiatric Medication Failures as reported by PT   Abilify Lamictal Zoloft Paxil Lexapro Seroquel Celexa Effexor Wellbutrin  Adderall  Cymbalta   Vraylar  Pristiq       Individual Medical History/ Review of Systems: Changes? :No   Allergies: Doxycycline and Tape  Current Medications:  Current Outpatient Medications:    brexpiprazole (REXULTI) 1 MG TABS tablet, Take 1 tablet (1 mg total) by mouth daily., Disp: 30 tablet, Rfl: 1   desvenlafaxine  (PRISTIQ ) 100 MG 24 hr tablet, Take 1 tablet (100 mg total) by mouth  daily., Disp: 30 tablet, Rfl: 1   acyclovir  (ZOVIRAX ) 800 MG tablet, Take 1 tablet by mouth 2 (two) times daily., Disp: , Rfl:    Calcium-Magnesium-Vitamin D  (CITRACAL CALCIUM +D3) 600-40-500 MG-MG-UNIT TB24, as directed Orally, Disp: , Rfl:    clonazePAM  (KLONOPIN ) 0.5 MG tablet, Take 0.5 mg by mouth., Disp: , Rfl:    desvenlafaxine  (PRISTIQ ) 50 MG 24 hr tablet, TAKE 1 TABLET(50 MG) BY MOUTH DAILY, Disp: 30 tablet, Rfl: 1   dexlansoprazole (DEXILANT) 60 MG capsule, Take 60 mg by mouth daily., Disp: , Rfl:    diclofenac  (FLECTOR ) 1.3 % PTCH, Place 4 patches onto the skin daily. Apply 2 patches per knee per day (4 a day total), Disp: 120 patch, Rfl: 2   DULoxetine  (CYMBALTA ) 20 MG capsule, Take 1 capsule (20 mg total) by mouth daily., Disp: 90 capsule, Rfl: 1   etodolac (LODINE XL) 500 MG 24 hr tablet, Take 500 mg by mouth as needed., Disp: , Rfl:    famotidine (PEPCID) 20 MG tablet, Take by mouth., Disp: , Rfl:    HYDROcodone -acetaminophen  (NORCO) 10-325 MG tablet, Take 1 tablet by mouth every 6 (six) hours as needed., Disp: 30 tablet, Rfl: 0   levocetirizine (XYZAL ) 5 MG tablet, Take 1 tablet by mouth daily., Disp: , Rfl:    levothyroxine (SYNTHROID) 88 MCG tablet, Take 88 mcg by mouth daily., Disp: , Rfl: 3   Melatonin 10 MG CAPS, Take 1 capsule by mouth at bedtime., Disp: , Rfl:    mirtazapine (REMERON) 15 MG tablet, Take 1 tablet by mouth at bedtime., Disp: , Rfl:    montelukast  (SINGULAIR ) 10 MG tablet,  Take 1 tablet by mouth daily., Disp: , Rfl:    morphine  (MSIR) 15 MG tablet, Take 15 mg by mouth as needed for severe pain (pain score 7-10)., Disp: , Rfl:    tizanidine  (ZANAFLEX ) 2 MG capsule, Take 1 capsule (2 mg total) by mouth 3 (three) times daily. (Patient taking differently: Take 4 mg by mouth 3 (three) times daily.), Disp: 90 capsule, Rfl: 5   Zoledronic  Acid (ZOMETA  IV), Inject 1 Application into the vein as directed., Disp: , Rfl:   Current Facility-Administered Medications:     0.9 %  sodium chloride  infusion, 500 mL, Intravenous, Once, Cirigliano, Vito V, DO   acetaminophen  (TYLENOL ) tablet 650 mg, 650 mg, Oral, Once, Mosher, Kelli A, PA-C Medication Side Effects: anxiety  Family Medical/ Social History: Changes? No  MENTAL HEALTH EXAM:  There were no vitals taken for this visit.There is no height or weight on file to calculate BMI.  General Appearance: Casual, Neat, and Well Groomed  Eye Contact:  Good  Speech:  Clear and Coherent  Volume:  Normal  Mood:  Depressed and Dysphoric  Affect:  Congruent  Thought Process:  Coherent  Orientation:  Full (Time, Place, and Person)  Thought Content: Logical   Suicidal Thoughts:  No  Homicidal Thoughts:  No  Memory:  WNL  Judgement:  Good  Insight:  Good  Psychomotor Activity:  Normal  Concentration:  Concentration: Good  Recall:  Good  Fund of Knowledge: Good  Language: Good  Assets:  Desire for Improvement  ADL's:  Intact  Cognition: WNL  Prognosis:  Good    DIAGNOSES:    ICD-10-CM   1. 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 Pt had questions today about IV ketamine therapy as well as other medications that may help with resistant depression.  She is limited with medications may effect kidneys. She will have to stop Cymbalta  but we will increase her Pristiq . Agreed to one last trial of Rexulti.  If no success, I feel that we have  reached medication max with safe medications considering her condition. If she does not seek Ketamine treatment, I will probably recommend she seek higher level of care. She is also seeking specialized therapy in someone who has specialty in caring for cx patients.   We agreed to: To reduce Cymbalta  to every other day use for one week and then stop To increase  Pristiq  to 100 mg daily in the am She is weaning off  mirtazapine 15 mg  at bedtime daily for now To start Rexulti 0.5 mg daily for one week, then 1 mg daily  Will follow-up in 4 weeks to reassess Provided emergency contact information Discussed potential metabolic side effects associated with atypical antipsychotics, as well as potential risk for movement side effects. Advised pt to contact office if movement side effects occur.   PDMP      [place]ell  DELENA Pizza, NP

## 2024-06-23 ENCOUNTER — Other Ambulatory Visit (HOSPITAL_COMMUNITY)

## 2024-06-25 ENCOUNTER — Encounter: Payer: Self-pay | Admitting: Gastroenterology

## 2024-07-01 ENCOUNTER — Ambulatory Visit (HOSPITAL_COMMUNITY)

## 2024-07-01 ENCOUNTER — Encounter (HOSPITAL_COMMUNITY): Payer: Self-pay

## 2024-07-07 ENCOUNTER — Telehealth: Admitting: Behavioral Health

## 2024-07-09 ENCOUNTER — Encounter: Payer: Self-pay | Admitting: Behavioral Health

## 2024-07-09 ENCOUNTER — Ambulatory Visit (INDEPENDENT_AMBULATORY_CARE_PROVIDER_SITE_OTHER): Admitting: Behavioral Health

## 2024-07-09 DIAGNOSIS — F411 Generalized anxiety disorder: Secondary | ICD-10-CM | POA: Diagnosis not present

## 2024-07-09 DIAGNOSIS — F332 Major depressive disorder, recurrent severe without psychotic features: Secondary | ICD-10-CM

## 2024-07-09 DIAGNOSIS — F329 Major depressive disorder, single episode, unspecified: Secondary | ICD-10-CM

## 2024-07-09 NOTE — Progress Notes (Signed)
 Kimberly Esparza [id number] 1966/08/05 58 y.o.  Virtual Visit via Telephone Note  I connected with pt on 07/09/24 at  2:00 PM EST by telephone and verified that I am speaking with the correct person using two identifiers.   I discussed the limitations, risks, security and privacy concerns of performing an evaluation and management service by telephone and the availability of in person appointments. I also discussed with the patient that there may be a patient responsible charge related to this service. The patient expressed understanding and agreed to proceed.   I discussed the assessment and treatment plan with the patient. The patient was provided an opportunity to ask questions and all were answered. The patient agreed with the plan and demonstrated an understanding of the instructions.   The patient was advised to call back or seek an in-person evaluation if the symptoms worsen or if the condition fails to improve as anticipated.  I provided 30 minutes of non-face-to-face time during this encounter.  The patient was located at home.  The provider was located at [facility].   Kimberly DELENA Pizza, NP   Subjective:   Patient ID:  Kimberly Esparza is a 58 y.o. (DOB 11/05/1965) female.  Chief Complaint:  Chief Complaint  Patient presents with   Anxiety   Depression   Follow-up   Patient Education   Stress    HPI  Kimberly Esparza presents for follow-up and medication management.  She continues with with treatment for multiple myeloma. She reports continued chronic depression today.  She understands we have tried many different medication without long term success.  Reports that she had to stop taking Rexulti after a very short period due to gaining 10 pounds of weight.  Says that she saw some information on Auvelity and was questioning this medication for trial.  I educated her on the psychodynamic's but encouraged her to consult her nephrologist.  She  has stage 3 kidney failure  and understands that we have to be very careful in managing psychiatric medications.  She has a long list of prior failures with antidepressants and antipsychotic medication for treatment of resistant depression and anxiety.  Met I have reinforced that we are not close to medication max and she may need to be referred to higher level of care.  She says that her anxiety today is 4/10 and depression is 5/10.  She is sleeping 7-8 hours per night with aid of mirtazapine. She is sleeping 7-8 hours at night. No mania, no psychosis, No SI/HI. Continues to f/u with specialist in treatment of cancer.   Past Psychiatric Medication Failures as reported by PT   Abilify Lamictal Zoloft Paxil Lexapro Seroquel Celexa Effexor Wellbutrin  Adderall  Cymbalta   Vraylar  Pristiq      Review of Systems:  Review of Systems  Constitutional: Negative.   Allergic/Immunologic: Negative.   Psychiatric/Behavioral:  Positive for dysphoric mood. The patient is nervous/anxious.     Medications: I have reviewed the patient's current medications.  Current Outpatient Medications  Medication Sig Dispense Refill   acyclovir  (ZOVIRAX ) 800 MG tablet Take 1 tablet by mouth 2 (two) times daily.     Calcium-Magnesium-Vitamin D  (CITRACAL CALCIUM +D3) 600-40-500 MG-MG-UNIT TB24 as directed Orally     clonazePAM  (KLONOPIN ) 0.5 MG tablet Take 0.5 mg by mouth.     desvenlafaxine  (PRISTIQ ) 100 MG 24 hr tablet Take 1 tablet (100 mg total) by mouth daily. 30 tablet 1   dexlansoprazole (DEXILANT) 60 MG capsule Take 60 mg by mouth  daily.     diclofenac  (FLECTOR ) 1.3 % PTCH Place 4 patches onto the skin daily. Apply 2 patches per knee per day (4 a day total) 120 patch 2   etodolac (LODINE XL) 500 MG 24 hr tablet Take 500 mg by mouth as needed.     famotidine (PEPCID) 20 MG tablet Take by mouth.     HYDROcodone -acetaminophen  (NORCO) 10-325 MG tablet Take 1 tablet by mouth every 6 (six) hours as needed. 30 tablet 0   levocetirizine  (XYZAL ) 5 MG tablet Take 1 tablet by mouth daily.     levothyroxine (SYNTHROID) 88 MCG tablet Take 88 mcg by mouth daily.  3   Melatonin 10 MG CAPS Take 1 capsule by mouth at bedtime.     mirtazapine (REMERON) 15 MG tablet Take 1 tablet by mouth at bedtime.     montelukast  (SINGULAIR ) 10 MG tablet Take 1 tablet by mouth daily.     morphine  (MSIR) 15 MG tablet Take 15 mg by mouth as needed for severe pain (pain score 7-10).     tizanidine  (ZANAFLEX ) 2 MG capsule Take 1 capsule (2 mg total) by mouth 3 (three) times daily. (Patient taking differently: Take 4 mg by mouth 3 (three) times daily.) 90 capsule 5   Zoledronic  Acid (ZOMETA  IV) Inject 1 Application into the vein as directed.     Current Facility-Administered Medications  Medication Dose Route Frequency Provider Last Rate Last Admin   0.9 %  sodium chloride  infusion  500 mL Intravenous Once Cirigliano, Vito V, DO       acetaminophen  (TYLENOL ) tablet 650 mg  650 mg Oral Once Mosher, Kelli A, PA-C        Medication Side Effects: None  Allergies:  Allergies  Allergen Reactions   Doxycycline Other (See Comments)    Other reaction(s): GI Upset (intolerance)   Tape Hives    Skin Irritation    Past Medical History:  Diagnosis Date   ADHD (attention deficit hyperactivity disorder)    Allergy     Anxiety    Blood transfusion without reported diagnosis    Cancer (HCC)    Cataract    Chronic kidney disease, stage 3 unspecified (HCC)    Compression fracture of T12 vertebra (HCC)    Depression    Fibromyalgia    GERD (gastroesophageal reflux disease)    Hashimoto's disease    Hypogammaglobulinemia    Hypothyroidism    Multiple allergies    Multiple myeloma (HCC)    Osteoarthritis    Osteoporosis    Peripheral neuropathy     Family History  Problem Relation Age of Onset   Hypertension Mother    Diabetes Mother    Glaucoma Mother    Gout Mother    Congestive Heart Failure Mother    Allergic rhinitis Father    Thyroid   disease Father    Allergic rhinitis Brother    Gout Brother    Colon cancer Neg Hx    Rectal cancer Neg Hx    Stomach cancer Neg Hx    Esophageal cancer Neg Hx     Social History   Socioeconomic History   Marital status: Married    Spouse name: Gaither   Number of children: 2   Years of education: 12+2   Highest education level: Associate degree: academic program  Occupational History    Comment: disabled  Tobacco Use   Smoking status: Never   Smokeless tobacco: Never  Vaping Use   Vaping status: Never Used  Substance and Sexual Activity   Alcohol use: Yes    Comment: socially in the past   Drug use: Never   Sexual activity: Not Currently    Partners: Male    Comment: Married but no interest  Other Topics Concern   Not on file  Social History Narrative   Not on file   Social Drivers of Health   Financial Resource Strain: Low Risk  (04/17/2024)   Received from [facility]   Overall Financial Resource Strain (CARDIA)    How hard is it for you to pay for the very basics like food, housing, medical care, and heating?: Not hard at all  Food Insecurity: No Food Insecurity (06/23/2024)   Received from [facility]   Hunger Vital Sign    Within the past 12 months, you worried that your food would run out before you got the money to buy more.: Never true    Within the past 12 months, the food you bought just didn't last and you didn't have money to get more.: Never true  Transportation Needs: No Transportation Needs (06/23/2024)   Received from [facility] - Transportation    In the past 12 months, has lack of transportation kept you from medical appointments or from getting medications?: No    In the past 12 months, has lack of transportation kept you from meetings, work, or from getting things needed for daily living?: No  Physical Activity: Unknown (06/29/2023)   Received from [facility]    Exercise Vital Sign    On average, how many  days per week do you engage in moderate to strenuous exercise (like a brisk walk)?: 0 days    Minutes of Exercise per Session: Not on file  Stress: No Stress Concern Present (06/23/2024)   Received from [facility] - Occupational Stress Questionnaire    Do you feel stress - tense, restless, nervous, or anxious, or unable to sleep at night because your mind is troubled all the time - these days?: Not at all  Social Connections: Socially Integrated (06/29/2023)   Received from [facility]    Social Connection and Isolation Panel    In a typical week, how many times do you talk on the phone with family, friends, or neighbors?: More than three times a week    How often do you get together with friends or relatives?: Once a week    How often do you attend church or religious services?: More than 4 times per year    Do you belong to any clubs or organizations such as church groups, unions, fraternal or athletic groups, or school groups?: Yes    How often do you attend meetings of the clubs or organizations you belong to?: More than 4 times per year    Are you married, widowed, divorced, separated, never married, or living with a partner?: Married  Intimate Partner Violence: Not At Risk (06/23/2024)   Received from [facility]   HITS    Over the last 12 months how often did your partner physically hurt you?: Never    Over the last 12 months how often did your partner insult you or talk down to you?: Never    Over the last 12 months how often did your partner threaten you with physical harm?: Never    Over the last 12 months how often did your partner scream or curse at you?: Never  Past Medical History, Surgical history, Social history, and Family history were reviewed and updated as appropriate.   Please see review of systems for further details on the patient's review from today.   Objective:   Physical Exam:  There  were no vitals taken for this visit.  Physical Exam Psychiatric:        Attention and Perception: Attention and perception normal.        Mood and Affect: Mood and affect normal.        Speech: Speech normal.        Behavior: Behavior normal. Behavior is cooperative.        Cognition and Memory: Cognition and memory normal.        Judgment: Judgment normal.     Lab Review:     Component Value Date/Time   NA 136 02/01/2024 1123   K 4.5 02/01/2024 1123   CL 100 02/01/2024 1123   CO2 19 (L) 02/01/2024 1123   GLUCOSE 90 02/01/2024 1123   GLUCOSE 101 (H) 08/08/2022 1336   BUN 32 (H) 02/01/2024 1123   CREATININE 1.13 (H) 02/01/2024 1123   CREATININE 3.13 (HH) 08/08/2022 1336   CALCIUM 8.9 02/01/2024 1123   PROT 6.6 02/01/2024 1123   ALBUMIN 4.2 02/01/2024 1123   AST 27 02/01/2024 1123   AST 45 (H) 08/08/2022 1336   ALT 30 02/01/2024 1123   ALT 39 08/08/2022 1336   ALKPHOS 145 (H) 02/01/2024 1123   BILITOT <0.2 02/01/2024 1123   BILITOT 1.2 08/08/2022 1336   GFRNONAA 17 (L) 08/08/2022 1336       Component Value Date/Time   WBC 8.4 02/01/2024 1123   WBC 1.0 (L) 08/08/2022 1336   RBC 4.43 02/01/2024 1123   RBC 2.42 (L) 08/08/2022 1336   HGB 14.1 02/01/2024 1123   HCT 42.7 02/01/2024 1123   PLT 199 02/01/2024 1123   MCV 96 02/01/2024 1123   MCH 31.8 02/01/2024 1123   MCH 28.9 08/08/2022 1336   MCHC 33.0 02/01/2024 1123   MCHC 32.4 08/08/2022 1336   RDW 12.7 02/01/2024 1123   LYMPHSABS 0.8 02/01/2024 1123   MONOABS 0.1 08/08/2022 1336   EOSABS 0.1 02/01/2024 1123   BASOSABS 0.1 02/01/2024 1123    No results found for: POCLITH, LITHIUM   No results found for: PHENYTOIN, PHENOBARB, VALPROATE, CBMZ   .res Assessment: Plan:    RECOMMENDATIONS:   Greater than 50% of 30  min face to face time with patient was spent on counseling and coordination of care.  PT reported that she stopped taking Rexulti after a very short period because it increased her  appetite and she gained 10 pounds in a very short period.  We discussed her seeing a commercial on Auvelity and I educated her on the psychodynamic's of the medication.  She is going to try to make a decision if she wants to try this medication or not.  Recommended she consult with her nephrologist due to kidney failure.  I reinforced that I feel that we have reached medication max with safe medications considering her condition., I will probably recommend she seek higher level of care after last trial of Auvelity if she decides. She is also seeking specialized therapy in someone who has specialty in caring for cx patients.    We agreed to: Continue Pristiq  to 100 mg daily in the am She is weaning off  mirtazapine 15 mg  at bedtime daily for now Pt stopped Rexulti 0.5 mg daily for one  week, then 1 mg daily  Will follow-up in 8  weeks to reassess Provided emergency contact information Discussed potential metabolic side effects associated with atypical antipsychotics, as well as potential risk for movement side effects. Advised pt to contact office if movement side effects occur.   PDMP         Kimberly DELENA Pizza, NP                 Ventura was seen today for anxiety, depression, follow-up, patient education and stress.  Diagnoses and all orders for this visit:  Severe episode of recurrent major depressive disorder, without psychotic features (HCC)  Generalized anxiety disorder  Treatment-resistant depression    Please see After Visit Summary for patient specific instructions.  No future appointments.  No orders of the defined types were placed in this encounter.     -------------------------------

## 2024-09-05 ENCOUNTER — Encounter: Payer: Self-pay | Admitting: Behavioral Health

## 2024-09-05 ENCOUNTER — Ambulatory Visit: Admitting: Behavioral Health

## 2024-09-05 DIAGNOSIS — F331 Major depressive disorder, recurrent, moderate: Secondary | ICD-10-CM

## 2024-09-05 DIAGNOSIS — F411 Generalized anxiety disorder: Secondary | ICD-10-CM | POA: Diagnosis not present

## 2024-09-05 DIAGNOSIS — F332 Major depressive disorder, recurrent severe without psychotic features: Secondary | ICD-10-CM

## 2024-09-05 DIAGNOSIS — F329 Major depressive disorder, single episode, unspecified: Secondary | ICD-10-CM

## 2024-09-05 MED ORDER — DESVENLAFAXINE SUCCINATE ER 100 MG PO TB24: 100.0000 mg | ORAL_TABLET | Freq: Every day | ORAL | 1 refills | Status: AC

## 2024-09-05 NOTE — Progress Notes (Signed)
 Kimberly Esparza [id number] Dec 19, 1965 59 y.o.  Virtual Visit via Telephone Note  I connected with pt on 09/05/2024 at 11:30 AM EST by telephone and verified that I am speaking with the correct person using two identifiers.   I discussed the limitations, risks, security and privacy concerns of performing an evaluation and management service by telephone and the availability of in person appointments. I also discussed with the patient that there may be a patient responsible charge related to this service. The patient expressed understanding and agreed to proceed.   I discussed the assessment and treatment plan with the patient. The patient was provided an opportunity to ask questions and all were answered. The patient agreed with the plan and demonstrated an understanding of the instructions.   The patient was advised to call back or seek an in-person evaluation if the symptoms worsen or if the condition fails to improve as anticipated.  I provided 30 minutes of non-face-to-face time during this encounter.  The patient was located at home.  The provider was located at [facility].   [name], NP   Subjective:   Patient ID:  Kimberly Esparza is a 59 y.o. (DOB 15-Mar-1966) female.  Chief Complaint:  Chief Complaint  Patient presents with   Depression   Anxiety   Follow-up   Medication Refill   Patient Education   Medication Problem    HPI Kimberly Esparza presents for follow-up and medication management.  She continues with with treatment for multiple myeloma.  States that she is doing okay right now with the Pristiq  100 mg.  She is in full remission with her multiple myeloma.  Requesting no medication changes or adjustments today.  She says that her anxiety today is 3/10 and depression is 3/10.  She is sleeping 7-8 hours per night with aid of mirtazapine. She is sleeping 7-8 hours at night. No mania, no psychosis, No SI/HI. Continues to f/u with specialist in treatment of  cancer.   Past Psychiatric Medication Failures as reported by PT   Abilify Lamictal Zoloft Paxil Lexapro Seroquel Celexa Effexor Wellbutrin  Adderall  Cymbalta   Vraylar    Review of Systems:  Review of Systems  Constitutional:  Positive for fatigue.  Allergic/Immunologic: Negative.   Psychiatric/Behavioral:  Positive for dysphoric mood. The patient is nervous/anxious.     Medications: I have reviewed the patient's current medications.  Current Outpatient Medications  Medication Sig Dispense Refill   acyclovir  (ZOVIRAX ) 800 MG tablet Take 1 tablet by mouth 2 (two) times daily.     Calcium-Magnesium-Vitamin D  (CITRACAL CALCIUM +D3) 600-40-500 MG-MG-UNIT TB24 as directed Orally     clonazePAM  (KLONOPIN ) 0.5 MG tablet Take 0.5 mg by mouth.     desvenlafaxine  (PRISTIQ ) 100 MG 24 hr tablet Take 1 tablet (100 mg total) by mouth daily. 30 tablet 1   dexlansoprazole (DEXILANT) 60 MG capsule Take 60 mg by mouth daily.     diclofenac  (FLECTOR ) 1.3 % PTCH Place 4 patches onto the skin daily. Apply 2 patches per knee per day (4 a day total) 120 patch 2   etodolac (LODINE XL) 500 MG 24 hr tablet Take 500 mg by mouth as needed.     famotidine (PEPCID) 20 MG tablet Take by mouth.     HYDROcodone -acetaminophen  (NORCO) 10-325 MG tablet Take 1 tablet by mouth every 6 (six) hours as needed. 30 tablet 0   levocetirizine (XYZAL ) 5 MG tablet Take 1 tablet by mouth daily.     levothyroxine (SYNTHROID) 88  MCG tablet Take 88 mcg by mouth daily.  3   Melatonin 10 MG CAPS Take 1 capsule by mouth at bedtime.     mirtazapine (REMERON) 15 MG tablet Take 1 tablet by mouth at bedtime.     montelukast  (SINGULAIR ) 10 MG tablet Take 1 tablet by mouth daily.     morphine  (MSIR) 15 MG tablet Take 15 mg by mouth as needed for severe pain (pain score 7-10).     tizanidine  (ZANAFLEX ) 2 MG capsule Take 1 capsule (2 mg total) by mouth 3 (three) times daily. (Patient taking differently: Take 4 mg by mouth 3 (three)  times daily.) 90 capsule 5   Zoledronic  Acid (ZOMETA  IV) Inject 1 Application into the vein as directed.     Current Facility-Administered Medications  Medication Dose Route Frequency Provider Last Rate Last Admin   0.9 %  sodium chloride  infusion  500 mL Intravenous Once Cirigliano, Vito V, DO       acetaminophen  (TYLENOL ) tablet 650 mg  650 mg Oral Once Mosher, Kelli A, PA-C        Medication Side Effects: None  Allergies: Allergies[1]  Past Medical History:  Diagnosis Date   ADHD (attention deficit hyperactivity disorder)    Allergy     Anxiety    Blood transfusion without reported diagnosis    Cancer (HCC)    Cataract    Chronic kidney disease, stage 3 unspecified (HCC)    Compression fracture of T12 vertebra (HCC)    Depression    Fibromyalgia    GERD (gastroesophageal reflux disease)    Hashimoto's disease    Hypogammaglobulinemia    Hypothyroidism    Multiple allergies    Multiple myeloma (HCC)    Osteoarthritis    Osteoporosis    Peripheral neuropathy     Family History  Problem Relation Age of Onset   Hypertension Mother    Diabetes Mother    Glaucoma Mother    Gout Mother    Congestive Heart Failure Mother    Allergic rhinitis Father    Thyroid  disease Father    Allergic rhinitis Brother    Gout Brother    Colon cancer Neg Hx    Rectal cancer Neg Hx    Stomach cancer Neg Hx    Esophageal cancer Neg Hx     Social History   Socioeconomic History   Marital status: Married    Spouse name: Gaither   Number of children: 2   Years of education: 12+2   Highest education level: Associate degree: academic program  Occupational History    Comment: disabled  Tobacco Use   Smoking status: Never   Smokeless tobacco: Never  Vaping Use   Vaping status: Never Used  Substance and Sexual Activity   Alcohol use: Yes    Comment: socially in the past   Drug use: Never   Sexual activity: Not Currently    Partners: Male    Comment: Married but no interest   Other Topics Concern   Not on file  Social History Narrative   Not on file   Social Drivers of Health   Tobacco Use: Low Risk (08/18/2024)   Received from [facility]   Patient History    Smoking Tobacco Use: Never    Smokeless Tobacco Use: Never    Passive Exposure: Not on file  Financial Resource Strain: Low Risk (04/17/2024)   Received from [facility]   Overall Financial Resource Strain (CARDIA)    How hard is it for  you to pay for the very basics like food, housing, medical care, and heating?: Not hard at all  Food Insecurity: No Food Insecurity (06/23/2024)   Received from [facility]   Epic    Within the past 12 months, you worried that your food would run out before you got the money to buy more.: Never true    Within the past 12 months, the food you bought just didn't last and you didn't have money to get more.: Never true  Transportation Needs: No Transportation Needs (06/23/2024)   Received from [facility]    In the past 12 months, has lack of transportation kept you from medical appointments or from getting medications?: No    In the past 12 months, has lack of transportation kept you from meetings, work, or from getting things needed for daily living?: No  Physical Activity: Unknown (06/29/2023)   Received from [facility]    Exercise Vital Sign    On average, how many days per week do you engage in moderate to strenuous exercise (like a brisk walk)?: 0 days    Minutes of Exercise per Session: Not on file  Stress: No Stress Concern Present (08/18/2024)   Received from [facility] - Occupational Stress Questionnaire    Do you feel stress - tense, restless, nervous, or anxious, or unable to sleep at night because your mind is troubled all the time - these days?: Only a little  Social Connections: Socially Integrated (06/29/2023)   Received from [facility]     Social Connection and Isolation Panel    In a typical week, how many times do you talk on the phone with family, friends, or neighbors?: More than three times a week    How often do you get together with friends or relatives?: Once a week    How often do you attend church or religious services?: More than 4 times per year    Do you belong to any clubs or organizations such as church groups, unions, fraternal or athletic groups, or school groups?: Yes    How often do you attend meetings of the clubs or organizations you belong to?: More than 4 times per year    Are you married, widowed, divorced, separated, never married, or living with a partner?: Married  Intimate Partner Violence: Not At Risk (08/18/2024)   Received from [facility]   HITS    Over the last 12 months how often did your partner physically hurt you?: Never    Over the last 12 months how often did your partner insult you or talk down to you?: Never    Over the last 12 months how often did your partner threaten you with physical harm?: Never    Over the last 12 months how often did your partner scream or curse at you?: Never  Depression (PHQ2-9): Medium Risk (02/05/2024)   Depression (PHQ2-9)    PHQ-2 Score: 6  Alcohol Screen: Not on file  Housing: Low Risk (06/23/2024)   Received from [facility]    In the last 12 months, was there a time when you were not able to pay the mortgage or rent on time?: No    In the past 12 months, how many times have you moved where you were living?: 0    At any time in the past 12 months, were you homeless or living in a  shelter (including now)?: No  Utilities: Not At Risk (06/23/2024)   Received from [facility]    In the past 12 months has the electric, gas, oil, or water company threatened to shut off services in your home?: No  Health Literacy: High Risk (10/04/2022)   Received from [facility]    How often do you need to have someone help you when  you read instructions, pamphlets, or other written material from your doctor or pharmacy?: Often    Past Medical History, Surgical history, Social history, and Family history were reviewed and updated as appropriate.   Please see review of systems for further details on the patient's review from today.   Objective:   Physical Exam:  There were no vitals taken for this visit.  Physical Exam  Lab Review:     Component Value Date/Time   NA 136 02/01/2024 1123   K 4.5 02/01/2024 1123   CL 100 02/01/2024 1123   CO2 19 (L) 02/01/2024 1123   GLUCOSE 90 02/01/2024 1123   GLUCOSE 101 (H) 08/08/2022 1336   BUN 32 (H) 02/01/2024 1123   CREATININE 1.13 (H) 02/01/2024 1123   CREATININE 3.13 (HH) 08/08/2022 1336   CALCIUM 8.9 02/01/2024 1123   PROT 6.6 02/01/2024 1123   ALBUMIN 4.2 02/01/2024 1123   AST 27 02/01/2024 1123   AST 45 (H) 08/08/2022 1336   ALT 30 02/01/2024 1123   ALT 39 08/08/2022 1336   ALKPHOS 145 (H) 02/01/2024 1123   BILITOT <0.2 02/01/2024 1123   BILITOT 1.2 08/08/2022 1336   GFRNONAA 17 (L) 08/08/2022 1336       Component Value Date/Time   WBC 8.4 02/01/2024 1123   WBC 1.0 (L) 08/08/2022 1336   RBC 4.43 02/01/2024 1123   RBC 2.42 (L) 08/08/2022 1336   HGB 14.1 02/01/2024 1123   HCT 42.7 02/01/2024 1123   PLT 199 02/01/2024 1123   MCV 96 02/01/2024 1123   MCH 31.8 02/01/2024 1123   MCH 28.9 08/08/2022 1336   MCHC 33.0 02/01/2024 1123   MCHC 32.4 08/08/2022 1336   RDW 12.7 02/01/2024 1123   LYMPHSABS 0.8 02/01/2024 1123   MONOABS 0.1 08/08/2022 1336   EOSABS 0.1 02/01/2024 1123   BASOSABS 0.1 02/01/2024 1123    No results found for: POCLITH, LITHIUM   No results found for: PHENYTOIN, PHENOBARB, VALPROATE, CBMZ   .res Assessment: Plan:   Greater than 50% of 30  min face to face time with patient was spent on counseling and coordination of care.  Patient is now only taking 1 antidepressant medication.  We discussed her current level of  stability.  Sounds fairly good on the phone.  Requesting 63-month follow-up, needing refills.  Any significant changes in the future will result in referral to higher level of care.     We agreed to: Continue Pristiq  to 100 mg daily in the am Will follow-up in 8  weeks to reassess Provided emergency contact information Discussed potential metabolic side effects associated with atypical antipsychotics, as well as potential risk for movement side effects. Advised pt to contact office if movement side effects occur.   PDMP    Redell A. Leslieanne Cobarrubias, NP      Kawena was seen today for depression, anxiety, follow-up, medication refill, patient education and medication problem.  Diagnoses and all orders for this visit:  Major depressive disorder, recurrent episode, moderate (HCC)  Generalized anxiety disorder -  desvenlafaxine  (PRISTIQ ) 100 MG 24 hr tablet; Take 1 tablet (100 mg total) by mouth daily.  Treatment-resistant depression -     desvenlafaxine  (PRISTIQ ) 100 MG 24 hr tablet; Take 1 tablet (100 mg total) by mouth daily.  Severe episode of recurrent major depressive disorder, without psychotic features (HCC) -     desvenlafaxine  (PRISTIQ ) 100 MG 24 hr tablet; Take 1 tablet (100 mg total) by mouth daily.    Please see After Visit Summary for patient specific instructions.  No future appointments.   No orders of the defined types were placed in this encounter.     -------------------------------    [1]  Allergies Allergen Reactions   Doxycycline Other (See Comments)    Other reaction(s): GI Upset (intolerance)   Tape Hives    Skin Irritation

## 2024-12-09 ENCOUNTER — Telehealth: Admitting: Behavioral Health
